# Patient Record
Sex: Male | Born: 1960 | Race: White | Hispanic: No | Marital: Married | Smoking: Never smoker
Health system: Southern US, Community
[De-identification: ages and names within clinical notes are randomized; demographics above are authoritative.]

## PROBLEM LIST (undated history)

## (undated) DIAGNOSIS — C451 Mesothelioma of peritoneum: Principal | ICD-10-CM

## (undated) DIAGNOSIS — C801 Malignant (primary) neoplasm, unspecified: Principal | ICD-10-CM

## (undated) DIAGNOSIS — R188 Other ascites: Secondary | ICD-10-CM

## (undated) DIAGNOSIS — C786 Secondary malignant neoplasm of retroperitoneum and peritoneum: Secondary | ICD-10-CM

## (undated) HISTORY — DX: Mesothelioma of peritoneum: C45.1

---

## 1998-06-11 ENCOUNTER — Ambulatory Visit (HOSPITAL_COMMUNITY): Admission: RE | Admit: 1998-06-11 | Discharge: 1998-06-11 | Payer: Self-pay | Admitting: Family Medicine

## 1999-07-25 ENCOUNTER — Other Ambulatory Visit: Admission: RE | Admit: 1999-07-25 | Discharge: 1999-07-25 | Payer: Self-pay | Admitting: Family Medicine

## 2016-12-06 ENCOUNTER — Emergency Department (HOSPITAL_COMMUNITY): Payer: PRIVATE HEALTH INSURANCE

## 2016-12-06 ENCOUNTER — Emergency Department (HOSPITAL_COMMUNITY)
Admission: EM | Admit: 2016-12-06 | Discharge: 2016-12-06 | Disposition: A | Discharge status: Home / Self Care | Payer: PRIVATE HEALTH INSURANCE | Attending: Emergency Medicine | Admitting: Emergency Medicine

## 2016-12-06 ENCOUNTER — Encounter (HOSPITAL_COMMUNITY): Payer: Self-pay | Admitting: Emergency Medicine

## 2016-12-06 DIAGNOSIS — R140 Abdominal distension (gaseous): Secondary | ICD-10-CM | POA: Diagnosis present

## 2016-12-06 DIAGNOSIS — Z79899 Other long term (current) drug therapy: Secondary | ICD-10-CM | POA: Diagnosis not present

## 2016-12-06 LAB — URINALYSIS, ROUTINE W REFLEX MICROSCOPIC
BILIRUBIN URINE: NEGATIVE
Glucose, UA: NEGATIVE mg/dL
Hgb urine dipstick: NEGATIVE
Ketones, ur: 80 mg/dL — AB
Nitrite: NEGATIVE
PROTEIN: 30 mg/dL — AB
SPECIFIC GRAVITY, URINE: 1.02 (ref 1.005–1.030)
SQUAMOUS EPITHELIAL / LPF: NONE SEEN
pH: 6 (ref 5.0–8.0)

## 2016-12-06 LAB — CBC WITH DIFFERENTIAL/PLATELET
BASOS ABS: 0 10*3/uL (ref 0.0–0.1)
Basophils Relative: 0 %
EOS ABS: 0.1 10*3/uL (ref 0.0–0.7)
EOS PCT: 1 %
HCT: 47.5 % (ref 39.0–52.0)
Hemoglobin: 16.6 g/dL (ref 13.0–17.0)
LYMPHS PCT: 13 %
Lymphs Abs: 1.2 10*3/uL (ref 0.7–4.0)
MCH: 31.4 pg (ref 26.0–34.0)
MCHC: 34.9 g/dL (ref 30.0–36.0)
MCV: 90 fL (ref 78.0–100.0)
Monocytes Absolute: 1.2 10*3/uL — ABNORMAL HIGH (ref 0.1–1.0)
Monocytes Relative: 13 %
Neutro Abs: 6.7 10*3/uL (ref 1.7–7.7)
Neutrophils Relative %: 73 %
PLATELETS: 331 10*3/uL (ref 150–400)
RBC: 5.28 MIL/uL (ref 4.22–5.81)
RDW: 12.8 % (ref 11.5–15.5)
WBC: 9.2 10*3/uL (ref 4.0–10.5)

## 2016-12-06 LAB — COMPREHENSIVE METABOLIC PANEL
ALT: 49 U/L (ref 17–63)
AST: 38 U/L (ref 15–41)
Albumin: 3.2 g/dL — ABNORMAL LOW (ref 3.5–5.0)
Alkaline Phosphatase: 60 U/L (ref 38–126)
Anion gap: 9 (ref 5–15)
BUN: 6 mg/dL (ref 6–20)
CHLORIDE: 100 mmol/L — AB (ref 101–111)
CO2: 26 mmol/L (ref 22–32)
CREATININE: 1 mg/dL (ref 0.61–1.24)
Calcium: 8.9 mg/dL (ref 8.9–10.3)
GFR calc Af Amer: >60 mL/min (ref >60)
GFR calc non Af Amer: >60 mL/min (ref >60)
Glucose, Bld: 85 mg/dL (ref 65–99)
Potassium: 3.9 mmol/L (ref 3.5–5.1)
SODIUM: 135 mmol/L (ref 135–145)
Total Bilirubin: 0.8 mg/dL (ref 0.3–1.2)
Total Protein: 6.2 g/dL — ABNORMAL LOW (ref 6.5–8.1)

## 2016-12-06 LAB — LIPASE, BLOOD: Lipase: 25 U/L (ref 11–51)

## 2016-12-06 NOTE — ED Notes (Signed)
Pt. sts he has been doing a green drink cleanse and has not eaten for the last 9 days. Pt. sts he has had regular bowel movements, but not passing gas. Pt. Denies N/V. Pt. Abdomen noted to be distended. No bruising noted to groin area, but fungal rash noted by EDP.

## 2016-12-06 NOTE — ED Notes (Signed)
EDP at bedside  

## 2016-12-06 NOTE — ED Provider Notes (Signed)
[facility] DEPT Provider Note   CSN: [id number] Arrival date & time: 12/06/16  1010     History   Chief Complaint Chief Complaint  Patient presents with  . Abdominal Pain  . Bleeding/Bruising    HPI Frank Tucker is a 55 y.o. male who presents with abdominal distension and pain. No significant PMH. He states he has been doing a "green juice cleanse" for the past 9 days. He noticed abdominal distension with pain about a week ago. Pain feels like a dull pressure. He has been taking simethicone with good relief. He has had excessive belching but has not been passing gas. Has been having multiple formed BMs daily. Reports associated difficulty getting deep breaths due to distension. Denies fever, chills, chest pain, SOB, cough, nausea, vomiting, diarrhea, urinary symptoms. He also has noted a rash in his groin area and bruising which concerned him therefore he came to the ED today. He has been treating the rash with hydrocortisone. He states he does not drink ETOH or smoke. Denies previous abdominal surgeries. Never had a colonoscopy.  HPI  History reviewed. No pertinent past medical history.  There are no active problems to display for this patient.   History reviewed. No pertinent surgical history.     Home Medications    Prior to Admission medications   Medication Sig Start Date End Date Taking? Authorizing Provider  hydrocortisone cream 1 % Apply 1 application topically as needed for itching.   Yes Historical Provider, MD  SIMETHICONE PO Take 1 capsule by mouth as needed (for stomach).   Yes Historical Provider, MD    Family History History reviewed. No pertinent family history.  Social History Social History  Substance Use Topics  . Smoking status: Never Smoker  . Smokeless tobacco: Never Used  . Alcohol use No     Allergies   Patient has no known allergies.   Review of Systems Review of Systems  Constitutional: Negative for chills and fever.    Respiratory: Negative for shortness of breath.   Cardiovascular: Negative for chest pain.  Gastrointestinal: Positive for abdominal distention and abdominal pain.  Genitourinary: Negative for dysuria.  All other systems reviewed and are negative.    Physical Exam Updated Vital Signs BP 113/82 (BP Location: Right Arm)   Pulse 91   Temp 97.6 F (36.4 C) (Oral)   Resp 18   Ht 5\' 10"  (1.778 m)   Wt 104.3 kg   SpO2 95%   BMI 33.00 kg/m   Physical Exam  Constitutional: He is oriented to person, place, and time. He appears well-developed and well-nourished. No distress.  HENT:  Head: Normocephalic and atraumatic.  Eyes: Conjunctivae are normal. Pupils are equal, round, and reactive to light. Right eye exhibits no discharge. Left eye exhibits no discharge. No scleral icterus.  Neck: Normal range of motion.  Cardiovascular: Normal rate and regular rhythm.  Exam reveals no gallop and no friction rub.   No murmur heard. Pulmonary/Chest: Effort normal and breath sounds normal. No respiratory distress. He has no wheezes. He has no rales. He exhibits no tenderness.  Abdominal: Soft. Bowel sounds are normal. He exhibits no distension and no mass. There is tenderness. There is no rebound and no guarding. No hernia.  Diffuse mild tenderness. Hypoactive bowel sounds  Neurological: He is alert and oriented to person, place, and time.  Skin: Skin is warm and dry.  Psychiatric: He has a normal mood and affect. His behavior is normal.  Nursing note and vitals  reviewed.    ED Treatments / Results  Labs (all labs ordered are listed, but only abnormal results are displayed) Labs Reviewed  CBC WITH DIFFERENTIAL/PLATELET - Abnormal; Notable for the following:       Result Value   Monocytes Absolute 1.2 (*)    All other components within normal limits  COMPREHENSIVE METABOLIC PANEL - Abnormal; Notable for the following:    Chloride 100 (*)    Total Protein 6.2 (*)    Albumin 3.2 (*)    All  other components within normal limits  URINALYSIS, ROUTINE W REFLEX MICROSCOPIC - Abnormal; Notable for the following:    APPearance HAZY (*)    Ketones, ur 80 (*)    Protein, ur 30 (*)    Leukocytes, UA TRACE (*)    Bacteria, UA RARE (*)    All other components within normal limits  LIPASE, BLOOD    EKG  EKG Interpretation None       Radiology Dg Abdomen 1 View  Result Date: 12/06/2016 CLINICAL DATA:  Central abdominal pain and distension. EXAM: ABDOMEN - 1 VIEW COMPARISON:  None. FINDINGS: Normal bowel gas pattern. Lumbar and lower thoracic spine degenerative changes. IMPRESSION: No acute abnormality. Electronically Signed   By: Claudie Revering M.D.   On: 12/06/2016 12:10    Procedures Procedures (including critical care time)  Medications Ordered in ED Medications - No data to display   Initial Impression / Assessment and Plan / ED Course  I have reviewed the triage vital signs and the nursing notes.  Pertinent labs & imaging results that were available during my care of the patient were reviewed by me and considered in my medical decision making (see chart for details).  Clinical Course    55 year old male presents with abdominal distension. Unclear etiology. May be due to excessive fiber intake from "cleanse". Patient is afebrile, not tachycardic or tachypneic, normotensive, and not hypoxic. Labs are overall unremarkable. UA remarkable for 80 ketones, trace leukocytes, 6-30 WBC. No urinary symptoms. KUB negative.   Shared visit with Dr. Jeanell Sparrow. Advised to follow up as outpatient. Do not feel further work up is currently indicated. Patient agreeable. Patient is NAD, non-toxic, with normal VS. Patient is informed of clinical course, understands medical decision making process, and agrees with plan. Opportunity for questions provided and all questions answered. Return precautions given.    Final Clinical Impressions(s) / ED Diagnoses   Final diagnoses:  Abdominal distension     New Prescriptions New Prescriptions   No medications on file     Recardo Evangelist, PA-C 12/06/16 1400    Pattricia Boss, MD 12/07/16 [phone]

## 2016-12-06 NOTE — ED Triage Notes (Signed)
Pt sts abd pain and distention and noticed bruising to legs currently and to abd last week; pt sts abnormal

## 2016-12-10 ENCOUNTER — Encounter: Payer: Self-pay | Admitting: Physician Assistant

## 2016-12-16 ENCOUNTER — Inpatient Hospital Stay (HOSPITAL_COMMUNITY)
Admission: EM | Admit: 2016-12-16 | Discharge: 2016-12-18 | DRG: 375 | Disposition: A | Discharge status: Home / Self Care | Payer: PRIVATE HEALTH INSURANCE | Attending: Internal Medicine | Admitting: Internal Medicine

## 2016-12-16 ENCOUNTER — Encounter (HOSPITAL_COMMUNITY): Payer: Self-pay | Admitting: Emergency Medicine

## 2016-12-16 ENCOUNTER — Observation Stay (HOSPITAL_COMMUNITY): Payer: PRIVATE HEALTH INSURANCE

## 2016-12-16 ENCOUNTER — Emergency Department (HOSPITAL_COMMUNITY): Payer: PRIVATE HEALTH INSURANCE

## 2016-12-16 DIAGNOSIS — R188 Other ascites: Secondary | ICD-10-CM | POA: Diagnosis not present

## 2016-12-16 DIAGNOSIS — C801 Malignant (primary) neoplasm, unspecified: Secondary | ICD-10-CM | POA: Diagnosis present

## 2016-12-16 DIAGNOSIS — C786 Secondary malignant neoplasm of retroperitoneum and peritoneum: Principal | ICD-10-CM | POA: Diagnosis present

## 2016-12-16 DIAGNOSIS — R0602 Shortness of breath: Secondary | ICD-10-CM | POA: Diagnosis not present

## 2016-12-16 DIAGNOSIS — R180 Malignant ascites: Secondary | ICD-10-CM

## 2016-12-16 LAB — COMPREHENSIVE METABOLIC PANEL
ALBUMIN: 3.2 g/dL — AB (ref 3.5–5.0)
ALK PHOS: 70 U/L (ref 38–126)
ALT: 38 U/L (ref 17–63)
ANION GAP: 12 (ref 5–15)
AST: 34 U/L (ref 15–41)
BILIRUBIN TOTAL: 0.6 mg/dL (ref 0.3–1.2)
BUN: 9 mg/dL (ref 6–20)
CALCIUM: 9.1 mg/dL (ref 8.9–10.3)
CO2: 27 mmol/L (ref 22–32)
Chloride: 100 mmol/L — ABNORMAL LOW (ref 101–111)
Creatinine, Ser: 1.07 mg/dL (ref 0.61–1.24)
GFR calc Af Amer: >60 mL/min (ref >60)
GLUCOSE: 80 mg/dL (ref 65–99)
POTASSIUM: 4.3 mmol/L (ref 3.5–5.1)
Sodium: 139 mmol/L (ref 135–145)
TOTAL PROTEIN: 6.4 g/dL — AB (ref 6.5–8.1)

## 2016-12-16 LAB — PROTIME-INR
INR: 1.01
Prothrombin Time: 13.3 seconds (ref 11.4–15.2)

## 2016-12-16 LAB — URINALYSIS, ROUTINE W REFLEX MICROSCOPIC
Bilirubin Urine: NEGATIVE
GLUCOSE, UA: NEGATIVE mg/dL
HGB URINE DIPSTICK: NEGATIVE
Ketones, ur: 80 mg/dL — AB
Leukocytes, UA: NEGATIVE
NITRITE: NEGATIVE
PH: 5 (ref 5.0–8.0)
Protein, ur: 100 mg/dL — AB
Specific Gravity, Urine: 1.026 (ref 1.005–1.030)
Squamous Epithelial / LPF: NONE SEEN

## 2016-12-16 LAB — APTT: aPTT: 33 seconds (ref 24–36)

## 2016-12-16 LAB — CBC
HEMATOCRIT: 47.5 % (ref 39.0–52.0)
HEMOGLOBIN: 16.9 g/dL (ref 13.0–17.0)
MCH: 32.2 pg (ref 26.0–34.0)
MCHC: 35.6 g/dL (ref 30.0–36.0)
MCV: 90.5 fL (ref 78.0–100.0)
Platelets: 385 10*3/uL (ref 150–400)
RBC: 5.25 MIL/uL (ref 4.22–5.81)
RDW: 12.9 % (ref 11.5–15.5)
WBC: 8.6 10*3/uL (ref 4.0–10.5)

## 2016-12-16 LAB — BRAIN NATRIURETIC PEPTIDE: B Natriuretic Peptide: 8.5 pg/mL (ref 0.0–100.0)

## 2016-12-16 LAB — LIPASE, BLOOD: Lipase: 20 U/L (ref 11–51)

## 2016-12-16 MED ORDER — ONDANSETRON HCL 4 MG/2ML IJ SOLN: 4.0000 mg | Freq: Four times a day (QID) | INTRAMUSCULAR | Status: DC | PRN

## 2016-12-16 MED ORDER — ACETAMINOPHEN 325 MG PO TABS
650.0000 mg | ORAL_TABLET | Freq: Four times a day (QID) | ORAL | Status: DC | PRN
  Administered 2016-12-16 – 2016-12-17 (×2): 650 mg via ORAL
  Filled 2016-12-16 (×2): qty 2

## 2016-12-16 MED ORDER — ACETAMINOPHEN 650 MG RE SUPP: 650.0000 mg | Freq: Four times a day (QID) | RECTAL | Status: DC | PRN

## 2016-12-16 MED ORDER — IOPAMIDOL (ISOVUE-300) INJECTION 61%
INTRAVENOUS | Status: AC
  Administered 2016-12-16: 100 mL
  Filled 2016-12-16: qty 100

## 2016-12-16 MED ORDER — DEXTROSE 5 % IV SOLN
2.0000 g | Freq: Every day | INTRAVENOUS | Status: DC
  Administered 2016-12-16 – 2016-12-17 (×2): 2 g via INTRAVENOUS
  Filled 2016-12-16 (×2): qty 2

## 2016-12-16 MED ORDER — MORPHINE SULFATE (PF) 4 MG/ML IV SOLN
4.0000 mg | Freq: Once | INTRAVENOUS | Status: AC
  Administered 2016-12-16: 4 mg via INTRAVENOUS
  Filled 2016-12-16: qty 1

## 2016-12-16 MED ORDER — ONDANSETRON HCL 4 MG PO TABS: 4.0000 mg | ORAL_TABLET | Freq: Four times a day (QID) | ORAL | Status: DC | PRN

## 2016-12-16 NOTE — H&P (Signed)
History and Physical    Frank Tucker [id number] DOB: July 18, 1961 DOA: 12/16/2016  PCP: No PCP Per Patient  Patient coming from: Home.  Chief Complaint: Abdominal distention and discomfort.  HPI: Frank Tucker is a 55 y.o. male with no significant past medical history presents to the ER with the complaints of increasing abdominal distention over the last 2 weeks with abdominal discomfort. Denies any nausea vomiting diarrhea fever or chills. Has had some shortness of breath due to abdominal distention. CT of the abdomen and pelvis shows features concerning for peritoneal carcinomatosis with large ascites. Patient is being admitted for further observation and management.   ED Course: CT of the abdomen and pelvis shows features concerning for peritoneal carcinomatosis with malignant ascites.  Review of Systems: As per HPI, rest all negative.   History reviewed. No pertinent past medical history.  History reviewed. No pertinent surgical history.   reports that he has never smoked. He has never used smokeless tobacco. He reports that he does not drink alcohol or use drugs.  No Known Allergies  Family History  Problem Relation Age of Onset  . Brain cancer Mother   . CAD Father     Prior to Admission medications   Medication Sig Start Date End Date Taking? Authorizing Provider  ibuprofen (ADVIL,MOTRIN) 200 MG tablet Take 200 mg by mouth every 6 (six) hours as needed.   Yes Historical Provider, MD    Physical Exam: Vitals:   12/16/16 2030 12/16/16 2045 12/16/16 2115 12/16/16 2224  BP: 130/82 131/94 137/97 120/68  Pulse: 91 92 99 91  Resp: 22 20 17 17   Temp:    97.9 F (36.6 C)  TempSrc:    Oral  SpO2: 95% 93% 94% 94%  Weight:    111.7 kg (246 lb 4.1 oz)  Height:    5\' 10"  (1.778 m)      Constitutional: Moderately built and nourished. Vitals:   12/16/16 2030 12/16/16 2045 12/16/16 2115 12/16/16 2224  BP: 130/82 131/94 137/97 120/68  Pulse: 91 92 99 91  Resp:  22 20 17 17   Temp:    97.9 F (36.6 C)  TempSrc:    Oral  SpO2: 95% 93% 94% 94%  Weight:    111.7 kg (246 lb 4.1 oz)  Height:    5\' 10"  (1.778 m)   Eyes: Anicteric no pallor. ENMT: No discharge from the ears eyes nose and mouth. Neck: No mass felt. No neck rigidity. Respiratory: No rhonchi or crepitations. Cardiovascular: S1-S2 heard no murmurs appreciated. Abdomen: Distended abdomen nontender bowel sounds present. No guarding or rigidity. Musculoskeletal: No edema. Skin: No rash. Skin appears warm. Neurologic: Alert awake oriented to time place and person. Moves all extremities. Psychiatric: Appears normal. Normal affect.   Labs on Admission: I have personally reviewed following labs and imaging studies  CBC:  Recent Labs Lab 12/16/16 1209  WBC 8.6  HGB 16.9  HCT 47.5  MCV 90.5  PLT 0000000   Basic Metabolic Panel:  Recent Labs Lab 12/16/16 1209  NA 139  K 4.3  CL 100*  CO2 27  GLUCOSE 80  BUN 9  CREATININE 1.07  CALCIUM 9.1   GFR: Estimated Creatinine Clearance: 98.8 mL/min (by C-G formula based on SCr of 1.07 mg/dL). Liver Function Tests:  Recent Labs Lab 12/16/16 1209  AST 34  ALT 38  ALKPHOS 70  BILITOT 0.6  PROT 6.4*  ALBUMIN 3.2*    Recent Labs Lab 12/16/16 1209  LIPASE 20  No results for input(s): AMMONIA in the last 168 hours. Coagulation Profile:  Recent Labs Lab 12/16/16 1919  INR 1.01   Cardiac Enzymes: No results for input(s): CKTOTAL, CKMB, CKMBINDEX, TROPONINI in the last 168 hours. BNP (last 3 results) No results for input(s): PROBNP in the last 8760 hours. HbA1C: No results for input(s): HGBA1C in the last 72 hours. CBG: No results for input(s): GLUCAP in the last 168 hours. Lipid Profile: No results for input(s): CHOL, HDL, LDLCALC, TRIG, CHOLHDL, LDLDIRECT in the last 72 hours. Thyroid Function Tests: No results for input(s): TSH, T4TOTAL, FREET4, T3FREE, THYROIDAB in the last 72 hours. Anemia Panel: No results  for input(s): VITAMINB12, FOLATE, FERRITIN, TIBC, IRON, RETICCTPCT in the last 72 hours. Urine analysis:    Component Value Date/Time   COLORURINE YELLOW 12/16/2016 1209   APPEARANCEUR HAZY (A) 12/16/2016 1209   LABSPEC 1.026 12/16/2016 1209   PHURINE 5.0 12/16/2016 1209   GLUCOSEU NEGATIVE 12/16/2016 1209   HGBUR NEGATIVE 12/16/2016 1209   BILIRUBINUR NEGATIVE 12/16/2016 1209   KETONESUR 80 (A) 12/16/2016 1209   PROTEINUR 100 (A) 12/16/2016 1209   NITRITE NEGATIVE 12/16/2016 1209   LEUKOCYTESUR NEGATIVE 12/16/2016 1209   Sepsis Labs: @LABRCNTIP (procalcitonin:4,lacticidven:4) )No results found for this or any previous visit (from the past 240 hour(s)).   Radiological Exams on Admission: Ct Abdomen Pelvis W Contrast  Result Date: 12/16/2016 CLINICAL DATA:  Subacute onset of worsening generalized abdominal distention and pain. Initial encounter. EXAM: CT ABDOMEN AND PELVIS WITH CONTRAST TECHNIQUE: Multidetector CT imaging of the abdomen and pelvis was performed using the standard protocol following bolus administration of intravenous contrast. CONTRAST:  148mL ISOVUE-300 IOPAMIDOL (ISOVUE-300) INJECTION 61% COMPARISON:  Abdominal radiograph performed 12/06/2016 FINDINGS: Lower chest: Mild bibasilar atelectasis is noted. The visualized portions of the mediastinum are otherwise unremarkable. Hepatobiliary: The liver is grossly unremarkable in appearance. The gallbladder is unremarkable. The common bile duct remains normal in caliber. Pancreas: The pancreas is unremarkable in appearance. Spleen: The spleen is unremarkable in appearance. Adrenals/Urinary Tract: The adrenal glands are unremarkable in appearance. The kidneys are within normal limits. There is no evidence of hydronephrosis. No renal or ureteral stones are identified. Mild nonspecific perinephric stranding is noted bilaterally. Stomach/Bowel: The stomach is unremarkable in appearance. The small bowel is grossly unremarkable, though  difficult to fully assess due to surrounding fluid. The appendix is normal in caliber, without evidence of appendicitis. No definite colonic mass is seen. The colon is difficult to fully assess due to surrounding fluid. Vascular/Lymphatic: The abdominal aorta is unremarkable in appearance. The inferior vena cava is grossly unremarkable. No retroperitoneal lymphadenopathy is seen. No pelvic sidewall lymphadenopathy is identified. Reproductive: A small urachal remnant is noted. The bladder is mildly distended and otherwise unremarkable. The prostate is borderline prominent. Other: A large amount of ascites is noted within the abdomen and pelvis. There is diffuse omental caking, more dense at the right upper quadrant, compatible with peritoneal carcinomatosis. Associated recruitment of vasculature is noted. No definite primary malignancy is identified on this study, though it cannot be excluded. Mesothelioma could have such an appearance. Musculoskeletal: No acute osseous abnormalities are identified. The visualized musculature is unremarkable in appearance. IMPRESSION: 1. Diffuse omental caking, more dense at the right upper quadrant, compatible with diffuse peritoneal carcinomatosis. No definite primary malignancy identified on the study, though it cannot be excluded. Mesothelioma could also have such an appearance. 2. Large amount of ascites throughout the abdomen and pelvis. Diagnostic paracentesis is recommended for further evaluation, to assess  for the origin of the malignancy. 3. Mild bibasilar atelectasis noted. These results were called by telephone at the time of interpretation on 12/16/2016 at 6:16 pm to Dr. Theotis Burrow, who verbally acknowledged these results. Electronically Signed   By: Garald Balding M.D.   On: 12/16/2016 18:16     Assessment/Plan Principal Problem:   Malignant ascites Active Problems:   Peritoneal carcinomatosis (HCC)   Ascites    1. Large ascites with possible peritoneal  carcinomatosis - I have ordered ultrasound-guided paracentesis. Requested labs for cell count differential Gram stain albumin LDH culture cytology. Patient may need oncology input. Also added empiric antibiotics until labs available.   DVT prophylaxis: SCDs. Code Status: Full code.  Family Communication: Patient's brother and sister at the bedside.  Disposition Plan: Home.  Consults called: None.  Admission status: Observation.    Rise Patience MD [facility] Pager [phone].  If 7PM-7AM, please contact night-coverage [email or web address] Password [facility]  12/16/2016, 10:42 PM

## 2016-12-16 NOTE — ED Triage Notes (Signed)
Pt. Stated, I've had stomach pain for over 3 weeks, and I feels so bloated and des tended. I tried to see a doctor but its not til the 5th.  Im just feeling bad and can not eat.

## 2016-12-16 NOTE — ED Notes (Signed)
Lab to add on BNP.  °

## 2016-12-16 NOTE — ED Notes (Signed)
Report attempted 

## 2016-12-16 NOTE — ED Notes (Signed)
Dr. Rex Kras advised pt can eat/drink, NPO after midnight. Pt updated

## 2016-12-16 NOTE — Progress Notes (Signed)
Pharmacy Antibiotic Note  SAMUELLE FULFORD is a 55 y.o. male admitted on 12/16/2016 with abd pain.  Pharmacy has been consulted for Rocephin dosing for intra-abd infection.  Plan: Rocephin 2gm IV q24h Pharmacy will sign off - please reconsult if needed  Height: 5\' 10"  (177.8 cm) Weight: 246 lb 4.1 oz (111.7 kg) IBW/kg (Calculated) : 73  Temp (24hrs), Avg:97.9 F (36.6 C), Min:97.8 F (36.6 C), Max:97.9 F (36.6 C)   Recent Labs Lab 12/16/16 1209  WBC 8.6  CREATININE 1.07    Estimated Creatinine Clearance: 98.8 mL/min (by C-G formula based on SCr of 1.07 mg/dL).    No Known Allergies  Thank you for allowing pharmacy to be a part of this patient's care.  Sherlon Handing, PharmD, BCPS Clinical pharmacist, pager [phone] 12/16/2016 11:07 PM

## 2016-12-16 NOTE — ED Notes (Signed)
Patient transported to CT 

## 2016-12-16 NOTE — ED Provider Notes (Signed)
[facility] DEPT Provider Note   CSN: [id number] Arrival date & time: 12/16/16 1153     History    Chief Complaint  Patient presents with  . Abdominal Pain     HPI Frank Tucker is a 55 y.o. male.  55yo M who p/w abdominal pain and distension. Patient reports a 2-3 week history of progressively worsening generalized abdominal pain and distention. He has noticed some weight gain recently and reports that he has some shortness of breath because he feels like he can't take a deep breath due to the severity of his distention. He denies any fevers, vomiting, chest pain, blood in his stool, or urinary problems. His symptoms are worse with eating. He was evaluated here last week and tried to schedule a follow-up appointment with his PCP but could not be seen quickly and his symptoms have progressed to the point that he cannot wait. No alcohol use or hx of liver problems.  History reviewed. No pertinent past medical history.   Patient Active Problem List   Diagnosis Date Noted  . Peritoneal carcinomatosis ([place]) 12/16/2016    History reviewed. No pertinent surgical history.      Home Medications    Prior to Admission medications   Medication Sig Start Date End Date Taking? Authorizing Provider  ibuprofen (ADVIL,MOTRIN) 200 MG tablet Take 200 mg by mouth every 6 (six) hours as needed.   Yes Historical Provider, MD      Family History  Problem Relation Age of Onset  . Brain cancer Mother   . CAD Father      Social History  Substance Use Topics  . Smoking status: Never Smoker  . Smokeless tobacco: Never Used  . Alcohol use No     Allergies     Patient has no known allergies.    Review of Systems  10 Systems reviewed and are negative for acute change except as noted in the HPI.   Physical Exam Updated Vital Signs BP 137/97   Pulse 99   Temp 97.8 F (36.6 C) (Oral)   Resp 17   Ht 5\' 10"  (1.778 m)   Wt 246 lb 5 oz (111.7 kg)   SpO2 94%   BMI  35.34 kg/m   Physical Exam  Constitutional: He is oriented to person, place, and time. He appears well-developed and well-nourished. No distress.  HENT:  Head: Normocephalic and atraumatic.  Moist mucous membranes  Eyes: Conjunctivae are normal. Pupils are equal, round, and reactive to light.  Neck: Neck supple.  Cardiovascular: Normal rate, regular rhythm and normal heart sounds.   No murmur heard. Pulmonary/Chest: Effort normal and breath sounds normal.  Abdominal: Bowel sounds are normal. He exhibits distension. There is no tenderness.  Moderate to severe distension without focal tenderness  Musculoskeletal: He exhibits no edema.  Neurological: He is alert and oriented to person, place, and time.  Fluent speech  Skin: Skin is warm and dry.  Psychiatric: He has a normal mood and affect. Judgment normal.  Nursing note and vitals reviewed.     ED Treatments / Results  Labs (all labs ordered are listed, but only abnormal results are displayed) Labs Reviewed  COMPREHENSIVE METABOLIC PANEL - Abnormal; Notable for the following:       Result Value   Chloride 100 (*)    Total Protein 6.4 (*)    Albumin 3.2 (*)    All other components within normal limits  URINALYSIS, ROUTINE W REFLEX MICROSCOPIC - Abnormal; Notable for the following:  APPearance HAZY (*)    Ketones, ur 80 (*)    Protein, ur 100 (*)    Bacteria, UA RARE (*)    All other components within normal limits  LIPASE, BLOOD  CBC  BRAIN NATRIURETIC PEPTIDE  PROTIME-INR  APTT     EKG  EKG Interpretation  Date/Time:  Wednesday December 16 2016 18:08:41 EST Ventricular Rate:  89 PR Interval:    QRS Duration: 113 QT Interval:  359 QTC Calculation: 437 R Axis:   95 Text Interpretation:  Sinus rhythm Lateral infarct, age indeterminate Probable anteroseptal infarct, recent No previous ECGs available Confirmed by Joseff Luckman MD, Dakwan Pridgen [phone]) on 12/16/2016 6:14:53 PM         Radiology Ct Abdomen Pelvis W  Contrast  Result Date: 12/16/2016 CLINICAL DATA:  Subacute onset of worsening generalized abdominal distention and pain. Initial encounter. EXAM: CT ABDOMEN AND PELVIS WITH CONTRAST TECHNIQUE: Multidetector CT imaging of the abdomen and pelvis was performed using the standard protocol following bolus administration of intravenous contrast. CONTRAST:  165mL ISOVUE-300 IOPAMIDOL (ISOVUE-300) INJECTION 61% COMPARISON:  Abdominal radiograph performed 12/06/2016 FINDINGS: Lower chest: Mild bibasilar atelectasis is noted. The visualized portions of the mediastinum are otherwise unremarkable. Hepatobiliary: The liver is grossly unremarkable in appearance. The gallbladder is unremarkable. The common bile duct remains normal in caliber. Pancreas: The pancreas is unremarkable in appearance. Spleen: The spleen is unremarkable in appearance. Adrenals/Urinary Tract: The adrenal glands are unremarkable in appearance. The kidneys are within normal limits. There is no evidence of hydronephrosis. No renal or ureteral stones are identified. Mild nonspecific perinephric stranding is noted bilaterally. Stomach/Bowel: The stomach is unremarkable in appearance. The small bowel is grossly unremarkable, though difficult to fully assess due to surrounding fluid. The appendix is normal in caliber, without evidence of appendicitis. No definite colonic mass is seen. The colon is difficult to fully assess due to surrounding fluid. Vascular/Lymphatic: The abdominal aorta is unremarkable in appearance. The inferior vena cava is grossly unremarkable. No retroperitoneal lymphadenopathy is seen. No pelvic sidewall lymphadenopathy is identified. Reproductive: A small urachal remnant is noted. The bladder is mildly distended and otherwise unremarkable. The prostate is borderline prominent. Other: A large amount of ascites is noted within the abdomen and pelvis. There is diffuse omental caking, more dense at the right upper quadrant, compatible with  peritoneal carcinomatosis. Associated recruitment of vasculature is noted. No definite primary malignancy is identified on this study, though it cannot be excluded. Mesothelioma could have such an appearance. Musculoskeletal: No acute osseous abnormalities are identified. The visualized musculature is unremarkable in appearance. IMPRESSION: 1. Diffuse omental caking, more dense at the right upper quadrant, compatible with diffuse peritoneal carcinomatosis. No definite primary malignancy identified on the study, though it cannot be excluded. Mesothelioma could also have such an appearance. 2. Large amount of ascites throughout the abdomen and pelvis. Diagnostic paracentesis is recommended for further evaluation, to assess for the origin of the malignancy. 3. Mild bibasilar atelectasis noted. These results were called by telephone at the time of interpretation on 12/16/2016 at 6:16 pm to Dr. Theotis Burrow, who verbally acknowledged these results. Electronically Signed   By: Garald Balding M.D.   On: 12/16/2016 18:16    Procedures Procedures (including critical care time) Procedures  Medications Ordered in ED  Medications  iopamidol (ISOVUE-300) 61 % injection (100 mLs  Contrast Given 12/16/16 1729)  morphine 4 MG/ML injection 4 mg (4 mg Intravenous Given 12/16/16 1917)     Initial Impression / Assessment and Plan /  ED Course  I have reviewed the triage vital signs and the nursing notes.  Pertinent labs & imaging results that were available during my care of the patient were reviewed by me and considered in my medical decision making (see chart for details).  Clinical Course    PT w/ several weeks of progressively worsening abdominal distention. He had moderate to severe abdominal distention on exam without focal tenderness. He is having bowel movements making obstruction less likely but given the degree of his distention and progressively worsening symptoms, obtained CT of abdomen and pelvis. His  lab work is unremarkable.  Gave the patient morphine for pain. CT shows diffuse omental caking suggestive of diffuse peritoneal carcinomatosis. Patient denies any history of known asbestos exposure. I discussed these results with him and recommended admission for paracentesis and further workup. Discussed admission with [facility] hospitalist, Dr. Hal Hope and pt admitted for further work up.   Final Clinical Impressions(s) / ED Diagnoses   Final diagnoses:  Peritoneal carcinomatosis ([place])  Malignant ascites     New Prescriptions   No medications on file       Sharlett Iles, MD 12/17/16 [phone]

## 2016-12-17 ENCOUNTER — Telehealth: Payer: Self-pay | Admitting: *Deleted

## 2016-12-17 ENCOUNTER — Observation Stay (HOSPITAL_COMMUNITY): Payer: PRIVATE HEALTH INSURANCE

## 2016-12-17 DIAGNOSIS — R180 Malignant ascites: Secondary | ICD-10-CM | POA: Diagnosis not present

## 2016-12-17 DIAGNOSIS — C786 Secondary malignant neoplasm of retroperitoneum and peritoneum: Secondary | ICD-10-CM | POA: Diagnosis present

## 2016-12-17 DIAGNOSIS — R0602 Shortness of breath: Secondary | ICD-10-CM | POA: Diagnosis present

## 2016-12-17 DIAGNOSIS — C801 Malignant (primary) neoplasm, unspecified: Secondary | ICD-10-CM | POA: Diagnosis present

## 2016-12-17 LAB — BASIC METABOLIC PANEL
Anion gap: 11 (ref 5–15)
BUN: 8 mg/dL (ref 6–20)
CALCIUM: 8.6 mg/dL — AB (ref 8.9–10.3)
CO2: 25 mmol/L (ref 22–32)
CREATININE: 1 mg/dL (ref 0.61–1.24)
Chloride: 99 mmol/L — ABNORMAL LOW (ref 101–111)
GFR calc Af Amer: >60 mL/min (ref >60)
Glucose, Bld: 81 mg/dL (ref 65–99)
Potassium: 3.6 mmol/L (ref 3.5–5.1)
SODIUM: 135 mmol/L (ref 135–145)

## 2016-12-17 LAB — CBC
HCT: 44.3 % (ref 39.0–52.0)
Hemoglobin: 15.6 g/dL (ref 13.0–17.0)
MCH: 31.8 pg (ref 26.0–34.0)
MCHC: 35.2 g/dL (ref 30.0–36.0)
MCV: 90.2 fL (ref 78.0–100.0)
PLATELETS: 349 10*3/uL (ref 150–400)
RBC: 4.91 MIL/uL (ref 4.22–5.81)
RDW: 13 % (ref 11.5–15.5)
WBC: 8.3 10*3/uL (ref 4.0–10.5)

## 2016-12-17 LAB — BODY FLUID CELL COUNT WITH DIFFERENTIAL
Eos, Fluid: NONE SEEN %
LYMPHS FL: 34 %
Monocyte-Macrophage-Serous Fluid: 65 % (ref 50–90)
NEUTROPHIL FLUID: 1 % (ref 0–25)
WBC FLUID: 1250 uL — AB (ref 0–1000)

## 2016-12-17 LAB — HEPATIC FUNCTION PANEL
ALBUMIN: 2.8 g/dL — AB (ref 3.5–5.0)
ALT: 39 U/L (ref 17–63)
AST: 35 U/L (ref 15–41)
Alkaline Phosphatase: 61 U/L (ref 38–126)
BILIRUBIN DIRECT: 0.1 mg/dL (ref 0.1–0.5)
Indirect Bilirubin: 0.3 mg/dL (ref 0.3–0.9)
TOTAL PROTEIN: 5.7 g/dL — AB (ref 6.5–8.1)
Total Bilirubin: 0.4 mg/dL (ref 0.3–1.2)

## 2016-12-17 LAB — GRAM STAIN

## 2016-12-17 LAB — LACTATE DEHYDROGENASE, PLEURAL OR PERITONEAL FLUID: LD FL: 1969 U/L — AB (ref 3–23)

## 2016-12-17 LAB — PROTEIN, BODY FLUID: Total protein, fluid: <3 g/dL

## 2016-12-17 LAB — ALBUMIN, FLUID (OTHER): ALBUMIN FL: 1.3 g/dL

## 2016-12-17 MED ORDER — OXYCODONE-ACETAMINOPHEN 5-325 MG PO TABS: 1.0000 | ORAL_TABLET | Freq: Three times a day (TID) | ORAL | Status: DC | PRN

## 2016-12-17 MED ORDER — NYSTATIN 100000 UNIT/GM EX POWD
Freq: Two times a day (BID) | CUTANEOUS | Status: DC
  Administered 2016-12-17 – 2016-12-18 (×3): via TOPICAL
  Filled 2016-12-17: qty 15

## 2016-12-17 MED ORDER — LIDOCAINE HCL (PF) 1 % IJ SOLN
INTRAMUSCULAR | Status: AC
  Filled 2016-12-17: qty 10

## 2016-12-17 NOTE — Procedures (Signed)
   US guided LLQ paracentesis  6.1 L yellow fluid  Tolerated well  Sent for labs per MD

## 2016-12-17 NOTE — Telephone Encounter (Signed)
Oncology Nurse Navigator Documentation  Oncology Nurse Navigator Flowsheets 12/17/2016  Navigator Location [facility]  Referral date to RadOnc/MedOnc 12/17/2016  Navigator Encounter Type Introductory phone call  Abnormal Finding Date 12/16/2016  Spoke with patient and provided new patient appointment for 12/25/16 at 1015/1030 in [facility] with Dr. Benay Spice. Informed of location of [facility], valet service, and registration process. Reminded to bring insurance cards and a current medication list, including supplements. Patient verbalizes understanding. Mailed appointment information to his home address after confirmation. Cancelled his appointment at [facility] for 12/25/16 after OK by Nicoletta Ba, PA. Dr. Benay Spice can refer him back if needed after he is seen on 12/25/16. Notified HIM of appointment.

## 2016-12-17 NOTE — Progress Notes (Signed)
PROGRESS NOTE  Frank Tucker [id number] DOB: 1961/08/03 DOA: 12/16/2016 PCP: No PCP Per Patient  HPI/Recap of past 24 hours:  Report abdominal distension, abdominal pain, loss of appetite and weight gain No fever  Assessment/Plan: Principal Problem:   Malignant ascites Active Problems:   Peritoneal carcinomatosis ([place])   Ascites   Large ascites with possible peritoneal carcinomatosis Patient is a nonsmoker, he dose not drink alcohol, no h/o drug use.  Diagnostic and therapeutic paracentesis to be done on 12/28, cytology ordered CT ab no primary lesion identified, he does not have anemia, add on CT chest, tumor marker testing cea/afp/psa. Will keep rocephin for now, likely able to d/c once fluids study done and rule out sbp. Pain control Oncology Dr Benay Spice consulted.   Code Status: full  Family Communication: patient and wife at bedside  Disposition Plan: home in 1-2 days   Consultants:  Oncology Dr Learta Codding  Procedures:  Paracentesis on 12/28  Antibiotics:  rocephin   Objective: BP 120/68 (BP Location: Left Arm)   Pulse 91   Temp 97.9 F (36.6 C) (Oral)   Resp 17   Ht 5\' 10"  (1.778 m)   Wt 111.7 kg (246 lb 4.1 oz)   SpO2 94%   BMI 35.33 kg/m   Intake/Output Summary (Last 24 hours) at 12/17/16 1125 Last data filed at 12/17/16 1122  Gross per 24 hour  Intake              660 ml  Output              275 ml  Net              385 ml   Filed Weights   12/16/16 1203 12/16/16 2224  Weight: 111.7 kg (246 lb 5 oz) 111.7 kg (246 lb 4.1 oz)    Exam:   General:  NAD  Cardiovascular: RRR  Respiratory: CTABL  Abdomen: distended abdomen, tight, diffuse mild tender, no guarding, no rebound, positive BS  Musculoskeletal: No Edema  Neuro: aaox3  Data Reviewed: Basic Metabolic Panel:  Recent Labs Lab 12/16/16 1209 12/17/16 0459  NA 139 135  K 4.3 3.6  CL 100* 99*  CO2 27 25  GLUCOSE 80 81  BUN 9 8  CREATININE 1.07 1.00  CALCIUM  9.1 8.6*   Liver Function Tests:  Recent Labs Lab 12/16/16 1209 12/17/16 0459  AST 34 35  ALT 38 39  ALKPHOS 70 61  BILITOT 0.6 0.4  PROT 6.4* 5.7*  ALBUMIN 3.2* 2.8*    Recent Labs Lab 12/16/16 1209  LIPASE 20   No results for input(s): AMMONIA in the last 168 hours. CBC:  Recent Labs Lab 12/16/16 1209 12/17/16 0459  WBC 8.6 8.3  HGB 16.9 15.6  HCT 47.5 44.3  MCV 90.5 90.2  PLT 385 349   Cardiac Enzymes:   No results for input(s): CKTOTAL, CKMB, CKMBINDEX, TROPONINI in the last 168 hours. BNP (last 3 results)  Recent Labs  12/16/16 1209  BNP 8.5    ProBNP (last 3 results) No results for input(s): PROBNP in the last 8760 hours.  CBG: No results for input(s): GLUCAP in the last 168 hours.  No results found for this or any previous visit (from the past 240 hour(s)).   Studies: Ct Abdomen Pelvis W Contrast  Result Date: 12/16/2016 CLINICAL DATA:  Subacute onset of worsening generalized abdominal distention and pain. Initial encounter. EXAM: CT ABDOMEN AND PELVIS WITH CONTRAST TECHNIQUE: Multidetector CT imaging of the  abdomen and pelvis was performed using the standard protocol following bolus administration of intravenous contrast. CONTRAST:  186mL ISOVUE-300 IOPAMIDOL (ISOVUE-300) INJECTION 61% COMPARISON:  Abdominal radiograph performed 12/06/2016 FINDINGS: Lower chest: Mild bibasilar atelectasis is noted. The visualized portions of the mediastinum are otherwise unremarkable. Hepatobiliary: The liver is grossly unremarkable in appearance. The gallbladder is unremarkable. The common bile duct remains normal in caliber. Pancreas: The pancreas is unremarkable in appearance. Spleen: The spleen is unremarkable in appearance. Adrenals/Urinary Tract: The adrenal glands are unremarkable in appearance. The kidneys are within normal limits. There is no evidence of hydronephrosis. No renal or ureteral stones are identified. Mild nonspecific perinephric stranding is noted  bilaterally. Stomach/Bowel: The stomach is unremarkable in appearance. The small bowel is grossly unremarkable, though difficult to fully assess due to surrounding fluid. The appendix is normal in caliber, without evidence of appendicitis. No definite colonic mass is seen. The colon is difficult to fully assess due to surrounding fluid. Vascular/Lymphatic: The abdominal aorta is unremarkable in appearance. The inferior vena cava is grossly unremarkable. No retroperitoneal lymphadenopathy is seen. No pelvic sidewall lymphadenopathy is identified. Reproductive: A small urachal remnant is noted. The bladder is mildly distended and otherwise unremarkable. The prostate is borderline prominent. Other: A large amount of ascites is noted within the abdomen and pelvis. There is diffuse omental caking, more dense at the right upper quadrant, compatible with peritoneal carcinomatosis. Associated recruitment of vasculature is noted. No definite primary malignancy is identified on this study, though it cannot be excluded. Mesothelioma could have such an appearance. Musculoskeletal: No acute osseous abnormalities are identified. The visualized musculature is unremarkable in appearance. IMPRESSION: 1. Diffuse omental caking, more dense at the right upper quadrant, compatible with diffuse peritoneal carcinomatosis. No definite primary malignancy identified on the study, though it cannot be excluded. Mesothelioma could also have such an appearance. 2. Large amount of ascites throughout the abdomen and pelvis. Diagnostic paracentesis is recommended for further evaluation, to assess for the origin of the malignancy. 3. Mild bibasilar atelectasis noted. These results were called by telephone at the time of interpretation on 12/16/2016 at 6:16 pm to Dr. Theotis Burrow, who verbally acknowledged these results. Electronically Signed   By: Garald Balding M.D.   On: 12/16/2016 18:16   Dg Chest Port 1 View  Result Date:  12/16/2016 CLINICAL DATA:  Dyspnea on exertion x2 weeks. EXAM: PORTABLE CHEST 1 VIEW COMPARISON:  None. FINDINGS: Low lung volumes with bibasilar atelectasis. Heart is normal in size. No aortic aneurysm. No suspicious osseous lesions. IMPRESSION: Low lung volumes with bibasilar atelectasis. Electronically Signed   By: Ashley Royalty M.D.   On: 12/16/2016 23:18    Scheduled Meds: . cefTRIAXone (ROCEPHIN)  IV  2 g Intravenous QHS  . nystatin   Topical BID    Continuous Infusions:   Time spent: 5mins  Vinaya Sancho MD, PhD  [facility] Pager [phone]. If 7PM-7AM, please contact night-coverage at [email or web address], password [facility] 12/17/2016, 11:25 AM  LOS: 0 days

## 2016-12-18 LAB — BASIC METABOLIC PANEL
Anion gap: 8 (ref 5–15)
BUN: 7 mg/dL (ref 6–20)
CHLORIDE: 100 mmol/L — AB (ref 101–111)
CO2: 29 mmol/L (ref 22–32)
Calcium: 8.3 mg/dL — ABNORMAL LOW (ref 8.9–10.3)
Creatinine, Ser: 1.01 mg/dL (ref 0.61–1.24)
GFR calc Af Amer: >60 mL/min (ref >60)
GLUCOSE: 95 mg/dL (ref 65–99)
POTASSIUM: 3.9 mmol/L (ref 3.5–5.1)
Sodium: 137 mmol/L (ref 135–145)

## 2016-12-18 LAB — PSA: PSA: 0.85 ng/mL (ref 0.00–4.00)

## 2016-12-18 LAB — MISC LABCORP TEST (SEND OUT): Labcorp test code: 88062

## 2016-12-18 LAB — OCCULT BLOOD X 1 CARD TO LAB, STOOL: Fecal Occult Bld: NEGATIVE

## 2016-12-18 LAB — MAGNESIUM: Magnesium: 1.8 mg/dL (ref 1.7–2.4)

## 2016-12-18 LAB — TSH: TSH: 5.244 u[IU]/mL — ABNORMAL HIGH (ref 0.350–4.500)

## 2016-12-18 MED ORDER — SENNOSIDES-DOCUSATE SODIUM 8.6-50 MG PO TABS
2.0000 | ORAL_TABLET | Freq: Two times a day (BID) | ORAL | Status: DC
  Administered 2016-12-18: 2 via ORAL
  Filled 2016-12-18: qty 2

## 2016-12-18 MED ORDER — NYSTATIN 100000 UNIT/GM EX POWD: Freq: Two times a day (BID) | CUTANEOUS | 0 refills | Status: DC

## 2016-12-18 MED ORDER — CEPHALEXIN 500 MG PO CAPS: 500.0000 mg | ORAL_CAPSULE | Freq: Two times a day (BID) | ORAL | 0 refills | Status: DC

## 2016-12-18 MED ORDER — BISACODYL 10 MG RE SUPP: 10.0000 mg | Freq: Every day | RECTAL | Status: DC

## 2016-12-18 MED ORDER — SENNOSIDES-DOCUSATE SODIUM 8.6-50 MG PO TABS: 2.0000 | ORAL_TABLET | Freq: Every day | ORAL | 0 refills | Status: DC

## 2016-12-18 MED ORDER — POLYETHYLENE GLYCOL 3350 17 G PO PACK: 17.0000 g | PACK | Freq: Every day | ORAL | Status: DC

## 2016-12-18 MED ORDER — OXYCODONE-ACETAMINOPHEN 5-325 MG PO TABS: 1.0000 | ORAL_TABLET | Freq: Three times a day (TID) | ORAL | 0 refills | Status: DC | PRN

## 2016-12-18 NOTE — Discharge Summary (Signed)
Discharge Summary  MRK ROHRBAUGH [id number] DOB: Mar 23, 1961  PCP: No PCP Per Patient  Admit date: 12/16/2016 Discharge date: 12/18/2016  Time spent: <37mins  Recommendations for Outpatient Follow-up:  1. F/u with PMD within a week  for hospital discharge follow up, repeat cbc/bmp at follow up 2. F/u with oncology Dr Benay Spice on 1/5  Discharge Diagnoses:  Active Hospital Problems   Diagnosis Date Noted  . Malignant ascites 12/16/2016  . Peritoneal carcinomatosis ([place]) 12/16/2016  . Ascites 12/16/2016    Resolved Hospital Problems   Diagnosis Date Noted Date Resolved  No resolved problems to display.    Discharge Condition: stable  Diet recommendation: regular diet  Filed Weights   12/16/16 2224 12/17/16 2159 12/18/16 0500  Weight: 111.7 kg (246 lb 4.1 oz) 111.5 kg (245 lb 13 oz) 111.5 kg (245 lb 13 oz)    History of present illness:  Patient coming from: Home.  Chief Complaint: Abdominal distention and discomfort.  HPI: Frank Tucker is a 55 y.o. male with no significant past medical history presents to the ER with the complaints of increasing abdominal distention over the last 2 weeks with abdominal discomfort. Denies any nausea vomiting diarrhea fever or chills. Has had some shortness of breath due to abdominal distention. CT of the abdomen and pelvis shows features concerning for peritoneal carcinomatosis with large ascites. Patient is being admitted for further observation and management.   ED Course: CT of the abdomen and pelvis shows features concerning for peritoneal carcinomatosis with malignant ascites.   Hospital Course:  Principal Problem:   Malignant ascites Active Problems:   Peritoneal carcinomatosis ([place])   Ascites   Large ascites with possible peritoneal carcinomatosis Patient is a nonsmoker, he dose not drink alcohol, no h/o drug use.  CT ab/chest no primary lesion identified  he does not have anemia,  tumor marker testing cea/afp  pending, psa wnl.  Diagnostic and therapeutic paracentesis to be done on 12/28, cytology pending, peritoneal fluids wbc 1250, gram statin no orgaminasm, he received iv rocephin in the hospital, patient does not have fever, no leukocytosis, he did have abdominal pain likely large volume, less likely sbp, elevated wbc in peritoneal fluids likely reactive, he received iv rocephin empirically in the hospital , he is discharged on oral keflex to finish total of 5 days of abx.  Pain control Oncology Dr Benay Spice consulted.  candiasis: bilateral groin, topical nystatin  Code Status: full  Family Communication: patient and son at bedside  Disposition Plan: home on 12/29   Consultants:  Oncology Dr Learta Codding  Procedures:  Paracentesis on 12/28 with 6.1 L yellow fluid removed  Antibiotics:  rocephin  Discharge Exam: BP 122/70 (BP Location: Right Arm)   Pulse 78   Temp 98 F (36.7 C) (Oral)   Resp 18   Ht 5\' 10"  (1.778 m)   Wt 111.5 kg (245 lb 13 oz)   SpO2 99%   BMI 35.27 kg/m     General:  NAD  Cardiovascular: RRR  Respiratory: CTABL  Abdomen: less distended abdomen,non tender, no guarding, no rebound, positive BS  Musculoskeletal: No Edema  Neuro: aaox3   Discharge Instructions You were cared for by a hospitalist during your hospital stay. If you have any questions about your discharge medications or the care you received while you were in the hospital after you are discharged, you can call the unit and asked to speak with the hospitalist on call if the hospitalist that took care of you is not available.  Once you are discharged, your primary care physician will handle any further medical issues. Please note that NO REFILLS for any discharge medications will be authorized once you are discharged, as it is imperative that you return to your primary care physician (or establish a relationship with a primary care physician if you do not have one) for your aftercare  needs so that they can reassess your need for medications and monitor your lab values.  Discharge Instructions    Diet - low sodium heart healthy    Complete by:  As directed    Increase activity slowly    Complete by:  As directed      Allergies as of 12/18/2016   No Known Allergies     Medication List    TAKE these medications   cephALEXin 500 MG capsule Commonly known as:  KEFLEX Take 1 capsule (500 mg total) by mouth 2 (two) times daily.   ibuprofen 200 MG tablet Commonly known as:  ADVIL,MOTRIN Take 200 mg by mouth every 6 (six) hours as needed.   nystatin powder Commonly known as:  MYCOSTATIN/NYSTOP Apply topically 2 (two) times daily.   oxyCODONE-acetaminophen 5-325 MG tablet Commonly known as:  PERCOCET/ROXICET Take 1 tablet by mouth every 8 (eight) hours as needed for moderate pain or severe pain.   senna-docusate 8.6-50 MG tablet Commonly known as:  Senokot-S Take 2 tablets by mouth at bedtime.      No Known Allergies Follow-up Information    Betsy Coder, MD Follow up on 12/25/2016.   Specialty:  Oncology Contact information: [place] [address] [phone]        follow up with primary care doctor Follow up in 2 week(s).   Why:  for hospital discharge follow up           The results of significant diagnostics from this hospitalization (including imaging, microbiology, ancillary and laboratory) are listed below for reference.    Significant Diagnostic Studies: Dg Abdomen 1 View  Result Date: 12/06/2016 CLINICAL DATA:  Central abdominal pain and distension. EXAM: ABDOMEN - 1 VIEW COMPARISON:  None. FINDINGS: Normal bowel gas pattern. Lumbar and lower thoracic spine degenerative changes. IMPRESSION: No acute abnormality. Electronically Signed   By: Claudie Revering M.D.   On: 12/06/2016 12:10   Ct Chest Wo Contrast  Result Date: 12/17/2016 CLINICAL DATA:  Ascites and omental disease.  Evaluate lungs. EXAM: CT CHEST  WITHOUT CONTRAST TECHNIQUE: Multidetector CT imaging of the chest was performed following the standard protocol without IV contrast. COMPARISON:  12/16/2016 abdominal CT scan. FINDINGS: Chest wall: No chest wall mass, supraclavicular or axillary lymphadenopathy. The thyroid gland appears normal. Cardiovascular: The heart is normal in size. No pericardial effusion. The aorta is normal in caliber. Scattered coronary artery calcifications. Mediastinum/Nodes: No mediastinal or hilar mass or lymphadenopathy. The esophagus is grossly normal. Lungs/Pleura: No acute pulmonary findings. No worrisome pulmonary nodules. No pleural effusion. There is bibasilar atelectasis and mild eventration of both hemidiaphragms. Upper Abdomen: Moderate 2 large volume upper abdominal ascites. I do not see any obvious morphologic changes of cirrhosis. No splenomegaly. Diffuse omental disease as demonstrated on the prior CT scan. High attenuation anterior MM gallbladder is likely due to vicarious excretion of contrast. Musculoskeletal: No significant bony findings. IMPRESSION: 1. No mediastinal or hilar mass or adenopathy and no pulmonary lesions. 2. Bibasilar atelectasis. 3. Upper abdominal ascites and omental disease as demonstrated on the recent abdominal CT scan. Electronically Signed   By: Marijo Sanes  M.D.   On: 12/17/2016 13:44   Ct Abdomen Pelvis W Contrast  Result Date: 12/16/2016 CLINICAL DATA:  Subacute onset of worsening generalized abdominal distention and pain. Initial encounter. EXAM: CT ABDOMEN AND PELVIS WITH CONTRAST TECHNIQUE: Multidetector CT imaging of the abdomen and pelvis was performed using the standard protocol following bolus administration of intravenous contrast. CONTRAST:  160mL ISOVUE-300 IOPAMIDOL (ISOVUE-300) INJECTION 61% COMPARISON:  Abdominal radiograph performed 12/06/2016 FINDINGS: Lower chest: Mild bibasilar atelectasis is noted. The visualized portions of the mediastinum are otherwise unremarkable.  Hepatobiliary: The liver is grossly unremarkable in appearance. The gallbladder is unremarkable. The common bile duct remains normal in caliber. Pancreas: The pancreas is unremarkable in appearance. Spleen: The spleen is unremarkable in appearance. Adrenals/Urinary Tract: The adrenal glands are unremarkable in appearance. The kidneys are within normal limits. There is no evidence of hydronephrosis. No renal or ureteral stones are identified. Mild nonspecific perinephric stranding is noted bilaterally. Stomach/Bowel: The stomach is unremarkable in appearance. The small bowel is grossly unremarkable, though difficult to fully assess due to surrounding fluid. The appendix is normal in caliber, without evidence of appendicitis. No definite colonic mass is seen. The colon is difficult to fully assess due to surrounding fluid. Vascular/Lymphatic: The abdominal aorta is unremarkable in appearance. The inferior vena cava is grossly unremarkable. No retroperitoneal lymphadenopathy is seen. No pelvic sidewall lymphadenopathy is identified. Reproductive: A small urachal remnant is noted. The bladder is mildly distended and otherwise unremarkable. The prostate is borderline prominent. Other: A large amount of ascites is noted within the abdomen and pelvis. There is diffuse omental caking, more dense at the right upper quadrant, compatible with peritoneal carcinomatosis. Associated recruitment of vasculature is noted. No definite primary malignancy is identified on this study, though it cannot be excluded. Mesothelioma could have such an appearance. Musculoskeletal: No acute osseous abnormalities are identified. The visualized musculature is unremarkable in appearance. IMPRESSION: 1. Diffuse omental caking, more dense at the right upper quadrant, compatible with diffuse peritoneal carcinomatosis. No definite primary malignancy identified on the study, though it cannot be excluded. Mesothelioma could also have such an appearance.  2. Large amount of ascites throughout the abdomen and pelvis. Diagnostic paracentesis is recommended for further evaluation, to assess for the origin of the malignancy. 3. Mild bibasilar atelectasis noted. These results were called by telephone at the time of interpretation on 12/16/2016 at 6:16 pm to Dr. Theotis Burrow, who verbally acknowledged these results. Electronically Signed   By: Garald Balding M.D.   On: 12/16/2016 18:16   US Paracentesis  Result Date: 12/17/2016 INDICATION: ascites EXAM: ULTRASOUND-GUIDED PARACENTESIS COMPARISON:  None. MEDICATIONS: 10 cc 1% lidocaine COMPLICATIONS: None immediate. TECHNIQUE: Informed written consent was obtained from the patient after a discussion of the risks, benefits and alternatives to treatment. A timeout was performed prior to the initiation of the procedure. Initial ultrasound scanning demonstrates a large amount of ascites within the left lower abdominal quadrant. The left lower abdomen was prepped and draped in the usual sterile fashion. 1% lidocaine with epinephrine was used for local anesthesia. Under direct ultrasound guidance, a 19 gauge, 7-cm, Yueh catheter was introduced. An ultrasound image was saved for documentation purposed. The paracentesis was performed. The catheter was removed and a dressing was applied. The patient tolerated the procedure well without immediate post procedural complication. FINDINGS: A total of approximately 6.1 liters of yellow fluid was removed. Samples were sent to the laboratory as requested by the clinical team. IMPRESSION: Successful ultrasound-guided paracentesis yielding 6.1 liters of peritoneal  fluid. Read by Lavonia Drafts [facility] Electronically Signed   By: Corrie Mckusick D.O.   On: 12/17/2016 12:44   Dg Chest Port 1 View  Result Date: 12/16/2016 CLINICAL DATA:  Dyspnea on exertion x2 weeks. EXAM: PORTABLE CHEST 1 VIEW COMPARISON:  None. FINDINGS: Low lung volumes with bibasilar atelectasis. Heart is normal in  size. No aortic aneurysm. No suspicious osseous lesions. IMPRESSION: Low lung volumes with bibasilar atelectasis. Electronically Signed   By: Ashley Royalty M.D.   On: 12/16/2016 23:18    Microbiology: Recent Results (from the past 240 hour(s))  Gram stain     Status: None   Collection Time: 12/17/16 12:19 PM  Result Value Ref Range Status   Specimen Description FLUID PERITONEAL  Final   Special Requests NONE  Final   Gram Stain   Final    MODERATE WBC PRESENT,BOTH PMN AND MONONUCLEAR NO ORGANISMS SEEN    Report Status 12/17/2016 FINAL  Final     Labs: Basic Metabolic Panel:  Recent Labs Lab 12/16/16 1209 12/17/16 0459 12/18/16 0557  NA 139 135 137  K 4.3 3.6 3.9  CL 100* 99* 100*  CO2 27 25 29   GLUCOSE 80 81 95  BUN 9 8 7   CREATININE 1.07 1.00 1.01  CALCIUM 9.1 8.6* 8.3*  MG  --   --  1.8   Liver Function Tests:  Recent Labs Lab 12/16/16 1209 12/17/16 0459  AST 34 35  ALT 38 39  ALKPHOS 70 61  BILITOT 0.6 0.4  PROT 6.4* 5.7*  ALBUMIN 3.2* 2.8*    Recent Labs Lab 12/16/16 1209  LIPASE 20   No results for input(s): AMMONIA in the last 168 hours. CBC:  Recent Labs Lab 12/16/16 1209 12/17/16 0459  WBC 8.6 8.3  HGB 16.9 15.6  HCT 47.5 44.3  MCV 90.5 90.2  PLT 385 349   Cardiac Enzymes: No results for input(s): CKTOTAL, CKMB, CKMBINDEX, TROPONINI in the last 168 hours. BNP: BNP (last 3 results)  Recent Labs  12/16/16 1209  BNP 8.5    ProBNP (last 3 results) No results for input(s): PROBNP in the last 8760 hours.  CBG: No results for input(s): GLUCAP in the last 168 hours.     SignedFlorencia Reasons MD, PhD  [facility] 12/18/2016, 11:54 AM

## 2016-12-19 LAB — CEA: CEA: 0.4 ng/mL (ref 0.0–4.7)

## 2016-12-19 LAB — AFP TUMOR MARKER: AFP TUMOR MARKER: 1.5 ng/mL (ref 0.0–8.3)

## 2016-12-22 LAB — CULTURE, BODY FLUID-BOTTLE: CULTURE: NO GROWTH

## 2016-12-22 LAB — CULTURE, BODY FLUID W GRAM STAIN -BOTTLE

## 2016-12-25 ENCOUNTER — Encounter: Payer: Self-pay | Admitting: *Deleted

## 2016-12-25 ENCOUNTER — Ambulatory Visit: Payer: PRIVATE HEALTH INSURANCE | Attending: Oncology | Admitting: Physical Therapy

## 2016-12-25 ENCOUNTER — Telehealth: Payer: Self-pay | Admitting: Oncology

## 2016-12-25 ENCOUNTER — Telehealth: Payer: Self-pay | Admitting: *Deleted

## 2016-12-25 ENCOUNTER — Ambulatory Visit (HOSPITAL_BASED_OUTPATIENT_CLINIC_OR_DEPARTMENT_OTHER): Payer: PRIVATE HEALTH INSURANCE | Admitting: Oncology

## 2016-12-25 ENCOUNTER — Encounter: Payer: Self-pay | Admitting: Physical Therapy

## 2016-12-25 ENCOUNTER — Ambulatory Visit: Payer: PRIVATE HEALTH INSURANCE | Admitting: Nutrition

## 2016-12-25 ENCOUNTER — Ambulatory Visit: Payer: Self-pay | Admitting: Physician Assistant

## 2016-12-25 ENCOUNTER — Ambulatory Visit (HOSPITAL_COMMUNITY)
Admission: RE | Admit: 2016-12-25 | Discharge: 2016-12-25 | Disposition: A | Discharge status: Home / Self Care | Payer: PRIVATE HEALTH INSURANCE | Source: Ambulatory Visit | Attending: Oncology | Admitting: Oncology

## 2016-12-25 VITALS — BP 136/89 | HR 84 | Temp 98.0°F | Resp 18 | Ht 70.0 in | Wt 242.2 lb

## 2016-12-25 DIAGNOSIS — M6281 Muscle weakness (generalized): Secondary | ICD-10-CM | POA: Insufficient documentation

## 2016-12-25 DIAGNOSIS — C786 Secondary malignant neoplasm of retroperitoneum and peritoneum: Secondary | ICD-10-CM | POA: Insufficient documentation

## 2016-12-25 DIAGNOSIS — R109 Unspecified abdominal pain: Secondary | ICD-10-CM | POA: Insufficient documentation

## 2016-12-25 DIAGNOSIS — C801 Malignant (primary) neoplasm, unspecified: Secondary | ICD-10-CM | POA: Diagnosis not present

## 2016-12-25 DIAGNOSIS — R180 Malignant ascites: Secondary | ICD-10-CM

## 2016-12-25 HISTORY — PX: PARACENTESIS: SHX844

## 2016-12-25 NOTE — Progress Notes (Signed)
Patient was seen in GI clinic.  56 year old male diagnosed with peritoneal carcinomatosis and is a patient of Dr. Julieanne Manson.  Past medical history was reviewed.  Medications include Zofran, and senna S  Labs were reviewed.  Height: 5 feet 10 inches. Weight: 242 pounds. Usual body weight: 220-225 pounds. BMI: Not accurate secondary to presence of ascites.  Patient status post 6.1 L of fluid removed in the hospital. He reports he is having additional fluids removed today. He states he has early satiety and increased pain when he eats. Reports he consumes approximately 25% of what he used to eat. He is eating 3 meals but no snacks. Patient denies difficulties chewing or swallowing and reports no nausea or vomiting at this time.  Nutrition diagnosis:  Food and nutrition related knowledge deficit related to new diagnosis of cancer as evidenced by no prior need for nutrition related information.  Intervention: Patient was educated to consume high-calorie, high-protein foods in small frequent meals and snacks. Encouraged and reviewed high protein foods and provided a fact sheet. Encouraged maintenance of lean body mass. Recommended patient try oral nutrition supplements or high-protein shakes. Questions were answered.  Teach back method used.  Fact sheets were given.  Patient was provided with my contact information.  Monitoring, evaluation, goals:  Patient will tolerate increased calories and protein minimize loss of lean body mass.  Next visit: To be scheduled with chemotherapy.  **Disclaimer: This note was dictated with voice recognition software. Similar sounding words can inadvertently be transcribed and this note may contain transcription errors which may not have been corrected upon publication of note.**

## 2016-12-25 NOTE — Progress Notes (Signed)
[facility] New Patient Consult   Referring MD: Florencia Reasons, [address] [place], [place] [address]   Frank Tucker 56 y.o.  1961/05/31    Reason for Referral: Abdominal carcinomatosis   HPI: Frank Tucker reports developing a distended abdomen in mid December 2017. He presented to the emergency room on 12/16/2016. A CT of the abdomen and pelvis revealed a large amount of ascites with diffuse omental caking. No evidence of a primary tumor site. He underwent an ultrasound paracentesis for 6 L of fluid on 12/17/2016. The cytology [id number]) revealed malignant cells consistent with carcinoma. Lungs cells are positive for cytokeratin 7 and WT-1, negative for cytokeratin 20, CDX-2, and TTF-1.  A CT the chest 12/17/2016 revealed no adenopathy or pulmonary lesion.  The abdominal fluid has reaccumulated over the past week.  Past medical history: None  Past surgical history: None  Medications: Reviewed  Allergies: No Known Allergies  Family history: His mother had "brain cancer "  Social History:   He lives with his wife and children in [place]. He works as a off Sales executive. He does not use cigarettes. He reports no alcohol for the past 15 years-previous weekend drinker   ROS:   Positives include: Distended abdomen, anorexia, early satiety, postprandial abdominal pain, dark urine, rationale groin  A complete ROS was otherwise negative.  Physical Exam:  Blood pressure 136/89, pulse 84, temperature 98 F (36.7 C), temperature source Oral, resp. rate 18, height 5\' 10"  (1.778 m), weight 242 lb 3.2 oz (109.9 kg), SpO2 98 %.  HEENT: Oropharynx without visible mass, neck without mass Lungs: Clear bilaterally Cardiac: Regular rate and rhythm Abdomen: Markedly distended, tender in the right upper abdomen, no mass, no hepatosplenomegaly GU: Testes without mass  Vascular: Trace pitting edema at the low leg bilaterally Lymph nodes: No cervical,  supraclavicular, axillary, or inguinal nodes Neurologic: Alert and oriented, the motor exam appears intact in the upper and lower extremities Skin: Yeast rash in the groin bilaterally Musculoskeletal: No spine tenderness   LAB:  CBC  Lab Results  Component Value Date   WBC 8.3 12/17/2016   HGB 15.6 12/17/2016   HCT 44.3 12/17/2016   MCV 90.2 12/17/2016   PLT 349 12/17/2016   NEUTROABS 6.7 12/06/2016     CMP      Component Value Date/Time   NA 137 12/18/2016 0557   K 3.9 12/18/2016 0557   CL 100 (L) 12/18/2016 0557   CO2 29 12/18/2016 0557   GLUCOSE 95 12/18/2016 0557   BUN 7 12/18/2016 0557   CREATININE 1.01 12/18/2016 0557   CALCIUM 8.3 (L) 12/18/2016 0557   PROT 5.7 (L) 12/17/2016 0459   ALBUMIN 2.8 (L) 12/17/2016 0459   AST 35 12/17/2016 0459   ALT 39 12/17/2016 0459   ALKPHOS 61 12/17/2016 0459   BILITOT 0.4 12/17/2016 0459   GFRNONAA >60 12/18/2016 0557   GFRAA >60 12/18/2016 0557    Imaging:  US Paracentesis  Result Date: 12/25/2016 INDICATION: Peritoneal carcinomatosis, recurrent malignant ascites; request made for therapeutic paracentesis. EXAM: ULTRASOUND GUIDED THERAPEUTIC PARACENTESIS MEDICATIONS: None. COMPLICATIONS: None immediate. PROCEDURE: Informed written consent was obtained from the patient after a discussion of the risks, benefits and alternatives to treatment. A timeout was performed prior to the initiation of the procedure. Initial ultrasound scanning demonstrates a large amount of ascites within the left lower abdominal quadrant. The left lower abdomen was prepped and draped in the usual sterile fashion. 1% lidocaine was used for  local anesthesia. Following this, a Yueh catheter was introduced. An ultrasound image was saved for documentation purposes. The paracentesis was performed. The catheter was removed and a dressing was applied. The patient tolerated the procedure well without immediate post procedural complication. FINDINGS: A total of  approximately 6 liters of yellow fluid was removed. IMPRESSION: Successful ultrasound-guided therapeutic paracentesis yielding 6 liters of peritoneal fluid. Read by: Rowe Robert, PA-C Electronically Signed   By: Aletta Edouard M.D.   On: 12/25/2016 15:17      Assessment/Plan:   1. Abdominal carcinomatosis  Paracentesis on 12/17/2016 confirmed malignant cells consistent with carcinoma, cytokeratin and WT-1 positive  CTs of the chest, abdomen, and pelvis with ascites and omental caking. No other evidence of metastatic disease and no primary tumor site identified    Disposition:   Frank Tucker has been diagnosed with metastatic carcinoma involving ascites. No primary tumor site has been identified on the staging CT scans. His case was presented at the GI tumor conference on 12/23/2016. It was recommended he be referred to gastroenterology for an upper endoscopy. The differential diagnosis includes peritoneal mesothelioma.  Frank Tucker will be referred for a staging PET scan. We will plan for a core biopsy of omental caking if an upper endoscopy is nondiagnostic.  He will return for an office visit and further discussion on 01/04/2017. He will be referred for a palliative paracentesis today.  Approximately 50 minutes were spent with the patient today. The majority of the time was used for counseling and coordination of care.  Betsy Coder, MD  12/25/2016, 5:29 PM

## 2016-12-25 NOTE — Progress Notes (Signed)
[facility] Psychosocial Distress Screening Clinical Social Work  Clinical Social Work met with pt and his 55 yo son, Chanson at [employer] to introduce self, explain role of CSW/Support Team and to was review the distress screening protocol.  The patient scored a 0 on the Psychosocial Distress Thermometer which indicates no distress. Clinical Social Worker met with pt and son to assess for distress and other psychosocial needs. 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 He was not aware of the severity of his cancer diagnosis today. He is eager to get his affairs in order as he was told he is incurable. Pt lives with his wife, Manuela Schwartz and two children Anibal Quinby and daughter Cristie Hem. He reports his wife does not work and suffers from alcoholism. His son will be starting college at [employer] in [place] in a few weeks and his daughter Cristie Hem is a senior in high school with plans to attend [employer] in the fall. Pt appears to have strong support though his church, his close group of friends and his children. He shared he and children cannot count on his wife for support. He is eager to plan ahead for end of life as a result. CSW reviewed all resources, coping options and supports through [facility] in detail. CSW also provided pt with an Advance Life planning financial booklet and coping with advanced cancer booklet as well. Pt had requested these items. Pt appears very open to attend support programs. CSW to follow closely for support.   ONCBCN DISTRESS SCREENING 12/25/2016  Screening Type Initial Screening  Distress experienced in past week (1-10) 0  Physical Problem type Pain;Skin dry/itchy;Swollen arms/legs  Physician notified of physical symptoms Yes  Referral to support programs Yes    Clinical Social Worker follow up needed: Yes.    If yes, follow up plan: See above Loren Racer, [place] Worker [place]  [facility] Phone: [phone] Fax: [phone]

## 2016-12-25 NOTE — Telephone Encounter (Signed)
Spoke with pt, he reports relief of pain after paracentesis procedure. Pt is aware of all upcoming appointments. He understands to call office prior to next visit with any questions or issues.

## 2016-12-25 NOTE — Progress Notes (Signed)
Oncology Nurse Navigator Documentation  Oncology Nurse Navigator Flowsheets 12/25/2016  Navigator Location [facility]  Referral date to RadOnc/MedOnc -  Navigator Encounter Type Clinic/MDC  Abnormal Finding Date -  Multidisiplinary Clinic Date 12/25/2016  Patient Visit Type MedOnc;Initial  Treatment Phase Pre-Tx/Tx Discussion  Barriers/Navigation Needs Education;Coordination of Care  Education Accessing Care/ Finding Providers;Preparing for Upcoming Treatment--  Interventions Coordination of Care;Education  Coordination of Care Appts--scheduled to see Dr. Loletha Carrow on 12/28/16 at 2pm (needs upper endo); collaborative nurse scheduled him for paracentesis today at 2pm.  Education Method Verbal;Written  Support Groups/Services GI Support Group  Acuity Level 2  Time Spent with Patient 5  Met with patient and his son during new patient visit. Explained the role of the GI Nurse Navigator and provided New Patient Packet with information on: 1.  PET scan 2. Support groups 3. Advanced Directives 4. Fall Safety Plan Answered questions, reviewed current treatment plan using TEACH back and provided emotional support. Provided copy of current treatment plan. Informed him that his PET will be scheduled after the authorization is done. Explained the rationale for the scan and upper endoscopy to determine the primary source of his cancer and other potential area to biopsy if necessary. CSW spent significant amount of time with him today--has a stressful social situation. Escorted patient and his son to scheduling area after his visit today. Due to time of PET on 1/12, visit with Dr. Benay Spice was moved to 01/04/17 at 1130. Patient notified of this and PET scan appointment, prep and given a map to [place].  Merceda Elks, RN, BSN GI Oncology [place]

## 2016-12-25 NOTE — Procedures (Signed)
Ultrasound-guided therapeutic paracentesis performed yielding 6 liters of yellow fluid. No immediate complications.  

## 2016-12-25 NOTE — Telephone Encounter (Signed)
Appointments scheduled per 1/5 LOS. Patient given AVS report and calendars with future scheduled appointments. Patient aware of PET scan appointment to be scheduled. Called [facility] to schedule PET scan.

## 2016-12-25 NOTE — Therapy (Signed)
[facility] [address], [place], [place], [address] Phone: [phone]   Fax:  [phone]  Physical Therapy Evaluation  Patient Details  Name: Frank Tucker MRN: [id number] Date of Birth: September 25, 1961 Referring Provider: Dr. Betsy Coder  Encounter Date: 12/25/2016      PT End of Session - 12/25/16 1215    Visit Number 1   PT Start Time [id number]   PT Stop Time 1234   PT Time Calculation (min) 24 min   Activity Tolerance Patient tolerated treatment well   Behavior During Therapy [facility] for tasks assessed/performed      History reviewed. No pertinent past medical history.  History reviewed. No pertinent surgical history.  There were no vitals filed for this visit.       Subjective Assessment - 12/25/16 1234    Subjective Patient is attending GI clinic    Patient is accompained by: Family member  son   Patient Stated Goals education   Currently in Pain? Yes   Pain Score 5    Pain Location Abdomen   Pain Orientation Upper;Lower;Anterior   Pain Descriptors / Indicators Constant;Dull   Pain Type Acute pain   Pain Onset More than a month ago   Pain Frequency Constant   Aggravating Factors  eating   Pain Relieving Factors not sure   Effect of Pain on Daily Activities laying down, exercise   Multiple Pain Sites No            [facility] PT Assessment - 12/25/16 0001      Assessment   Medical Diagnosis Peritoneal Carinomatosis   Referring Provider Dr. Betsy Coder   Onset Date/Surgical Date 12/17/17   Prior Therapy none     Precautions   Precautions Other (comment)   Precaution Comments cancer precautions     Restrictions   Weight Bearing Restrictions No     Balance Screen   Has the patient fallen in the past 6 months No   Has the patient had a decrease in activity level because of a fear of falling?  No   Is the patient reluctant to leave their home because of a fear of falling?  No     Home  Ec[place] residence     Prior Function   Level of Independence Independent   Vocation Full time employment     Cognition   Overall Cognitive Status Within Functional Limits for tasks assessed     Observation/Other Assessments   Focus on Therapeutic Outcomes (FOTO)  Therapist discretion is 15% limitation due to abdomen distended making it difficult to do things     Posture/Postural Control   Posture/Postural Control Postural limitations   Posture Comments distended abdomen     ROM / Strength   AROM / PROM / Strength AROM;Strength     AROM   Overall AROM  Within functional limits for tasks performed     Strength   Overall Strength Comments UE strength is 4/5 and LE strength is 5/5     Transfers   Transfers Supine to Sit   Supine to Sit 6: Modified independent (Device/Increase time)  trouble when abdomen is filled with fluid     Ambulation/Gait   Ambulation/Gait No     Balance   Balance Assessed No                           PT Education - 12/25/16 1214  Education provided Yes   Education Details walking program; Tips to conserve energy; balance exercises   Person(s) Educated Patient   Methods Explanation;Demonstration;Handout   Comprehension Returned demonstration;Verbalized understanding             PT Long Term Goals - 12/25/16 1244      PT LONG TERM GOAL #1   Title independent with HEP including balance exercises and walking program   Time 1   Period Days   Status Achieved     PT LONG TERM GOAL #2   Title understand ways to conserve energy during treatment   Time 1   Period Days   Status Achieved               Plan - 12/25/16 1215    Clinical Impression Statement Patient is a 56 year old male with diagnosis of peritoneal caricinomatosis on 12/17/2016. Patient abdomen swells up and needs to be drained to reduce.  Patient reports dull constant abdominal pain at level 5/10 that is worse with  eating. Patient reports no falls in the past 6 months.  Patient has access to exercise equipment at home but does not use it. Bilateral upper extremity strength is 5/5. Patient has difficulty with going from supine to standing due to his abdomen being distended.  Patient is low complexity due to evolving condition with abdomen swelling but 1 comorbidity that includes peritoneal carcinomatosis that could impact care. Patient benefit from skilled therapy to be instructed on walking program, balance exercises, and tips to conserve energy.     Rehab Potential Good   Clinical Impairments Affecting Rehab Potential back issues   PT Frequency 1x / week   PT Duration Other (comment)  1 time session in GI clinic   PT Treatment/Interventions Therapeutic exercise;Therapeutic activities;Patient/family education   PT Next Visit Plan Discharge to HEP   PT Home Exercise Plan Current HEP   Recommended Other Services In the future may benefit from physical therapy if has difficulty with HEP   Consulted and Agree with Plan of Care Patient;Family member/caregiver   Family Member Consulted son      Patient will benefit from skilled therapeutic intervention in order to improve the following deficits and impairments:  Decreased strength, Decreased mobility  Visit Diagnosis: Muscle weakness (generalized) - Plan: PT plan of care cert/re-cert     Problem List Patient Active Problem List   Diagnosis Date Noted  . Peritoneal carcinomatosis ([place]) 12/16/2016  . Malignant ascites 12/16/2016  . Ascites 12/16/2016    [name], PT 12/25/16 12:47 PM    [facility] [address] [place], [place], [address] Phone: [phone]   Fax:  [phone]  Name: Frank Tucker MRN: [id number] Date of Birth: 01-19-1961

## 2016-12-25 NOTE — Patient Instructions (Signed)
  Tips for Energy Conservation for Activities of Daily Living . Plan ahead to avoid rushing. . Sit down to bathe and dry off. Wear a terry robe instead of drying off. . Use a shower/bath organizer to decrease leaning and reaching. . Use extension handles on sponges and brushes. Susa Simmonds grab rails in the bathroom or use an elevated toilet seat. Hoyle Barr out clothes and toiletries before dressing. . Minimize leaning over to put on clothes and shoes. Bring your foot to your knee to apply socks and shoes. . Wear comfortable shoes and low-heeled, slip on shoes. Wear button front shirts rather than pullovers. Housekeeping . Schedule household tasks throughout the week. . Do housework sitting down when possible. . Delegate heavy housework, shopping, laundry and child care when possible. . Drag or slide objects rather than lifting. . Sit when ironing and take rest periods. . Stop working before becoming overly tired. Shopping . Organize list by aisle. . Use a grocery cart for support. Marland Kitchen Shop at less busy times. . Ask for help with getting to the car. Meal Preparation . Use convenience and easy-to-prepare foods. . Use small appliances that take less effort to use. Marland Kitchen Prepare meals sitting down. . Soak dishes instead of scrubbing and let dishes air dry. . Prepare double portions and freeze half. Child Care . Plan activities that can be done sitting down, such as drawing pictures, playing games, reading, and computer games. . Encourage children to climb up onto your lap or into the highchair instead of being lifted. . Make a game of the household chores so that children will want to help. . Delegate child care when possible.  [name], PT, [place] [facility] [place]; [place], [place], [place] [address] Tandem Stance    Can hold onto counter if not feeling steady. Right foot in front of left, heel touching toe both feet "straight ahead". Stand on Foot  Triangle of Support with both feet. Balance in this position _30__ seconds. Do with left foot in front of right. 3 times each  Copyright  VHI. All rights reserved.    Stance: single leg on floor. Raise leg. Hold _15__ seconds. Repeat with other leg. __3_ reps per set, _1__ sets per day .  Can hold onto counter to keep steady  Copyright  VHI. All rights reserved.    Ways to get started on an exercise program 1.  Start for 10 minutes per day with a walking program. 2. Work towards 30 minutes of exercise per day 3. When you do an aerobic exercise program start on a low level 4. Water aerobics is a good place due to decreased strain on your joints 5. Begin your exercise program gradually and progress slowly over time 6. When exercising use correct form. a. Keep neutral spine b. Engage abdominals c. Keep chest up  d. Chin down e. Do not lock your knees  [name], PT Outpatient Rehab at [facility] [address] [place], [place] [address] [phone]

## 2016-12-28 ENCOUNTER — Telehealth: Payer: Self-pay

## 2016-12-28 ENCOUNTER — Encounter (HOSPITAL_COMMUNITY): Payer: Self-pay | Admitting: *Deleted

## 2016-12-28 ENCOUNTER — Other Ambulatory Visit: Payer: Self-pay

## 2016-12-28 ENCOUNTER — Ambulatory Visit: Payer: PRIVATE HEALTH INSURANCE | Admitting: Gastroenterology

## 2016-12-28 DIAGNOSIS — R180 Malignant ascites: Secondary | ICD-10-CM

## 2016-12-28 NOTE — Progress Notes (Signed)
Prep. instructions

## 2016-12-28 NOTE — Telephone Encounter (Signed)
Spoke to patient, he is aware that he is scheduled for EGD at [place] on 1/9, arrive at 9:15 for a 11:15. Patient given verbal instructions on prep, understands to have someone to come with him to drive him home. Dr. Aleatha Borer to cancel patients office appointment for today.

## 2016-12-29 ENCOUNTER — Other Ambulatory Visit: Payer: Self-pay | Admitting: *Deleted

## 2016-12-29 ENCOUNTER — Ambulatory Visit (HOSPITAL_COMMUNITY)
Admission: RE | Admit: 2016-12-29 | Discharge: 2016-12-29 | Disposition: A | Discharge status: Home / Self Care | Payer: PRIVATE HEALTH INSURANCE | Source: Ambulatory Visit | Attending: Gastroenterology | Admitting: Gastroenterology

## 2016-12-29 ENCOUNTER — Telehealth: Payer: Self-pay | Admitting: *Deleted

## 2016-12-29 ENCOUNTER — Ambulatory Visit (HOSPITAL_COMMUNITY): Payer: PRIVATE HEALTH INSURANCE | Admitting: Anesthesiology

## 2016-12-29 ENCOUNTER — Encounter (HOSPITAL_COMMUNITY): Payer: Self-pay

## 2016-12-29 ENCOUNTER — Encounter (HOSPITAL_COMMUNITY)
Admission: RE | Disposition: A | Discharge status: Home / Self Care | Payer: Self-pay | Source: Ambulatory Visit | Attending: Gastroenterology

## 2016-12-29 DIAGNOSIS — R933 Abnormal findings on diagnostic imaging of other parts of digestive tract: Secondary | ICD-10-CM | POA: Diagnosis not present

## 2016-12-29 DIAGNOSIS — Z79899 Other long term (current) drug therapy: Secondary | ICD-10-CM | POA: Diagnosis not present

## 2016-12-29 DIAGNOSIS — C801 Malignant (primary) neoplasm, unspecified: Secondary | ICD-10-CM | POA: Insufficient documentation

## 2016-12-29 DIAGNOSIS — R180 Malignant ascites: Secondary | ICD-10-CM

## 2016-12-29 DIAGNOSIS — C786 Secondary malignant neoplasm of retroperitoneum and peritoneum: Secondary | ICD-10-CM

## 2016-12-29 DIAGNOSIS — R935 Abnormal findings on diagnostic imaging of other abdominal regions, including retroperitoneum: Secondary | ICD-10-CM

## 2016-12-29 DIAGNOSIS — K210 Gastro-esophageal reflux disease with esophagitis: Secondary | ICD-10-CM

## 2016-12-29 HISTORY — PX: ESOPHAGOGASTRODUODENOSCOPY (EGD) WITH PROPOFOL: SHX5813

## 2016-12-29 HISTORY — DX: Other ascites: R18.8

## 2016-12-29 SURGERY — ESOPHAGOGASTRODUODENOSCOPY (EGD) WITH PROPOFOL
Anesthesia: Monitor Anesthesia Care

## 2016-12-29 MED ORDER — PROPOFOL 10 MG/ML IV BOLUS
INTRAVENOUS | Status: DC | PRN
  Administered 2016-12-29: 50 mg via INTRAVENOUS
  Administered 2016-12-29: 20 mg via INTRAVENOUS
  Administered 2016-12-29: 40 mg via INTRAVENOUS
  Administered 2016-12-29: 20 mg via INTRAVENOUS

## 2016-12-29 MED ORDER — SODIUM CHLORIDE 0.9 % IV SOLN: INTRAVENOUS | Status: DC

## 2016-12-29 MED ORDER — LIDOCAINE 2% (20 MG/ML) 5 ML SYRINGE
INTRAMUSCULAR | Status: DC | PRN
  Administered 2016-12-29: 100 mg via INTRAVENOUS

## 2016-12-29 MED ORDER — PROPOFOL 10 MG/ML IV BOLUS
INTRAVENOUS | Status: AC
  Filled 2016-12-29: qty 40

## 2016-12-29 MED ORDER — PROPOFOL 500 MG/50ML IV EMUL
INTRAVENOUS | Status: DC | PRN
  Administered 2016-12-29: 100 ug/kg/min via INTRAVENOUS

## 2016-12-29 MED ORDER — LACTATED RINGERS IV SOLN
INTRAVENOUS | Status: DC
  Administered 2016-12-29: 11:00:00 via INTRAVENOUS

## 2016-12-29 MED ORDER — LIDOCAINE 2% (20 MG/ML) 5 ML SYRINGE
INTRAMUSCULAR | Status: AC
  Filled 2016-12-29: qty 5

## 2016-12-29 SURGICAL SUPPLY — 14 items

## 2016-12-29 NOTE — Anesthesia Postprocedure Evaluation (Signed)
Anesthesia Post Note  Patient: Frank Tucker  Procedure(s) Performed: Procedure(s) (LRB): ESOPHAGOGASTRODUODENOSCOPY (EGD) WITH PROPOFOL (N/A)  Patient location during evaluation: PACU Anesthesia Type: MAC Level of consciousness: awake and alert Pain management: pain level controlled Vital Signs Assessment: post-procedure vital signs reviewed and stable Respiratory status: spontaneous breathing, nonlabored ventilation, respiratory function stable and patient connected to nasal cannula oxygen Cardiovascular status: stable and blood pressure returned to baseline Anesthetic complications: no       Last Vitals:  Vitals:   12/29/16 1110 12/29/16 1120  BP: 113/71 125/78  Pulse: 76 75  Resp: 17 18  Temp:      Last Pain:  Vitals:   12/29/16 1103  TempSrc: Oral                 Jacole Capley S

## 2016-12-29 NOTE — Transfer of Care (Signed)
Immediate Anesthesia Transfer of Care Note  Patient: Frank Tucker  Procedure(s) Performed: Procedure(s): ESOPHAGOGASTRODUODENOSCOPY (EGD) WITH PROPOFOL (N/A)  Patient Location: PACU  Anesthesia Type:MAC  Level of Consciousness: Patient easily awoken, sedated, comfortable, cooperative, following commands, responds to stimulation.   Airway & Oxygen Therapy: Patient spontaneously breathing, ventilating well, oxygen via simple oxygen mask.  Post-op Assessment: Report given to PACU RN, vital signs reviewed and stable, moving all extremities.   Post vital signs: Reviewed and stable.  Complications: No apparent anesthesia complications Last Vitals:  Vitals:   12/29/16 0953  BP: 129/68  Pulse: 82  Resp: 17  Temp: 36.7 C    Last Pain:  Vitals:   12/29/16 0953  TempSrc: Oral         Complications: No apparent anesthesia complications

## 2016-12-29 NOTE — Discharge Instructions (Signed)
YOU HAD AN ENDOSCOPIC PROCEDURE TODAY: Refer to the procedure report and other information in the discharge instructions given to you for any specific questions about what was found during the examination. If this information does not answer your questions, please call [place] office at [phone] to clarify.  ° °YOU SHOULD EXPECT: Some feelings of bloating in the abdomen. Passage of more gas than usual. Walking can help get rid of the air that was put into your GI tract during the procedure and reduce the bloating. If you had a lower endoscopy (such as a colonoscopy or flexible sigmoidoscopy) you may notice spotting of blood in your stool or on the toilet paper. Some abdominal soreness may be present for a day or two, also. ° °DIET: Your first meal following the procedure should be a light meal and then it is ok to progress to your normal diet. A half-sandwich or bowl of soup is an example of a good first meal. Heavy or fried foods are harder to digest and may make you feel nauseous or bloated. Drink plenty of fluids but you should avoid alcoholic beverages for 24 hours. If you had a esophageal dilation, please see attached instructions for diet.   ° °ACTIVITY: Your care partner should take you home directly after the procedure. You should plan to take it easy, moving slowly for the rest of the day. You can resume normal activity the day after the procedure however YOU SHOULD NOT DRIVE, use power tools, machinery or perform tasks that involve climbing or major physical exertion for 24 hours (because of the sedation medicines used during the test).  ° °SYMPTOMS TO REPORT IMMEDIATELY: °A gastroenterologist can be reached at any hour. Please call [phone]  for any of the following symptoms:  °Following lower endoscopy (colonoscopy, flexible sigmoidoscopy) °Excessive amounts of blood in the stool  °Significant tenderness, worsening of abdominal pains  °Swelling of the abdomen that is new, acute  °Fever of 100° or  higher  °Following upper endoscopy (EGD, EUS, ERCP, esophageal dilation) °Vomiting of blood or coffee ground material  °New, significant abdominal pain  °New, significant chest pain or pain under the shoulder blades  °Painful or persistently difficult swallowing  °New shortness of breath  °Black, tarry-looking or red, bloody stools ° °FOLLOW UP:  °If any biopsies were taken you will be contacted by phone or by letter within the next 1-3 weeks. Call [phone]  if you have not heard about the biopsies in 3 weeks.  °Please also call with any specific questions about appointments or follow up tests. ° °

## 2016-12-29 NOTE — H&P (Signed)
History:  This patient presents for endoscopic testing for malignant ascites, ? Primary source. Dr Gearldine Shown oncology consult note from 12/25/16 was reviewed.  Derenda Fennel Referring physician: No PCP Per Patient  Past Medical History: Past Medical History:  Diagnosis Date  . Ascites      Past Surgical History: Past Surgical History:  Procedure Laterality Date  . PARACENTESIS  12/25/2016    Allergies: No Known Allergies  Outpatient Meds: Current Facility-Administered Medications  Medication Dose Route Frequency Provider Last Rate Last Dose  . 0.9 %  sodium chloride infusion   Intravenous Continuous Nelida Meuse III, MD      . lactated ringers infusion   Intravenous Continuous Albertha Ghee, MD          ___________________________________________________________________ Objective   Exam:  BP 129/68   Pulse 82   Temp 98 F (36.7 C) (Oral)   Resp 17   Ht 5\' 10"  (1.778 m)   Wt 240 lb (108.9 kg)   SpO2 96%   BMI 34.44 kg/m    CV: RRR without murmur, S1/S2, no JVD, no peripheral edema  Resp: clear to auscultation bilaterally, normal RR and effort noted.  Breathing comfortably  GI: large amount of ascites.  soft, no tenderness, with active bowel sounds. No guarding or palpable organomegaly noted.  Neuro: awake, alert and oriented x 3. Normal gross motor function and fluent speech   Assessment:  Malignant ascites  Plan:  EGD   Nelida Meuse III

## 2016-12-29 NOTE — Anesthesia Preprocedure Evaluation (Signed)
Anesthesia Evaluation  Patient identified by MRN, date of birth, ID band Patient awake    Reviewed: Allergy & Precautions, H&P , NPO status , Patient's Chart, lab work & pertinent test results  Airway Mallampati: II   Neck ROM: full    Dental   Pulmonary neg pulmonary ROS,    breath sounds clear to auscultation       Cardiovascular negative cardio ROS   Rhythm:regular Rate:Normal     Neuro/Psych    GI/Hepatic 12/25/2016: paracentesis for ascites.  Peritoneal carcinomatosis.   Endo/Other    Renal/GU      Musculoskeletal   Abdominal   Peds  Hematology   Anesthesia Other Findings   Reproductive/Obstetrics                             Anesthesia Physical Anesthesia Plan  ASA: II  Anesthesia Plan: MAC   Post-op Pain Management:    Induction: Intravenous  Airway Management Planned: Nasal Cannula  Additional Equipment:   Intra-op Plan:   Post-operative Plan:   Informed Consent: I have reviewed the patients History and Physical, chart, labs and discussed the procedure including the risks, benefits and alternatives for the proposed anesthesia with the patient or authorized representative who has indicated his/her understanding and acceptance.     Plan Discussed with: CRNA, Anesthesiologist and Surgeon  Anesthesia Plan Comments:         Anesthesia Quick Evaluation

## 2016-12-29 NOTE — Telephone Encounter (Signed)
Call placed to patient to inform him that paracentesis has been scheduled for Friday, 01/01/17 per order of Dr. Benay Spice.  Pt instructed to arrive at radiology at 2:15PM for procedure at 3:00PM.  Patient appreciative of call and has no questions at this time.

## 2016-12-29 NOTE — Interval H&P Note (Signed)
History and Physical Interval Note:  12/29/2016 10:16 AM  Frank Tucker  has presented today for surgery, with the diagnosis of malignant ascites  The various methods of treatment have been discussed with the patient and family. After consideration of risks, benefits and other options for treatment, the patient has consented to  Procedure(s): ESOPHAGOGASTRODUODENOSCOPY (EGD) WITH PROPOFOL (N/A) as a surgical intervention .  The patient's history has been reviewed, patient examined, no change in status, stable for surgery.  I have reviewed the patient's chart and labs.  Questions were answered to the patient's satisfaction.     Nelida Meuse III

## 2016-12-29 NOTE — Op Note (Signed)
[facility] Patient Name: Frank Tucker Procedure Date: 12/29/2016 MRN: [id number] Attending MD: Estill Cotta. Loletha Carrow , MD Date of Birth: 31-Jan-1961 CSN: [id number] Age: 56 Admit Type: Outpatient Procedure:                Upper GI endoscopy Indications:              Abnormal CT of the GI tract (malignant ascites, ?                            primary source) Providers:                Estill Cotta. Loletha Carrow, MD, Hilma Favors, RN, Cletis Athens,                            Technician Referring MD:             Izola Price. Sherrill Medicines:                Monitored Anesthesia Care Complications:            No immediate complications. Estimated Blood Loss:     Estimated blood loss: none. Procedure:                Pre-Anesthesia Assessment:                           - Prior to the procedure, a History and Physical                            was performed, and patient medications and                            allergies were reviewed. The patient's tolerance of                            previous anesthesia was also reviewed. The risks                            and benefits of the procedure and the sedation                            options and risks were discussed with the patient.                            All questions were answered, and informed consent                            was obtained. Prior Anticoagulants: The patient has                            taken no previous anticoagulant or antiplatelet                            agents. ASA Grade Assessment: III - A patient with  severe systemic disease. After reviewing the risks                            and benefits, the patient was deemed in                            satisfactory condition to undergo the procedure.                           After obtaining informed consent, the endoscope was                            passed under direct vision. Throughout the                            procedure, the  patient's blood pressure, pulse, and                            oxygen saturations were monitored continuously. The                            [id number] ([id number]) scope was introduced through the                            mouth, and advanced to the second part of duodenum.                            The upper GI endoscopy was accomplished without                            difficulty. The patient tolerated the procedure. Scope In: Scope Out: Findings:      LA Grade B (one or more mucosal breaks greater than 5 mm, not extending       between the tops of two mucosal folds) esophagitis was found.      The stomach was normal.      The cardia and gastric fundus were normal on retroflexion.      The examined duodenum was normal. Impression:               - LA Grade B reflux esophagitis related to large                            volume ascites.                           - Normal stomach.                           - Normal examined duodenum.                           - No specimens collected.                           No malignancy seen in UGI tract. Moderate Sedation:      MAC  sedation used Recommendation:           - Patient has a contact number available for                            emergencies. The signs and symptoms of potential                            delayed complications were discussed with the                            patient. Return to normal activities tomorrow.                            Written discharge instructions were provided to the                            patient.                           - Resume previous diet.                           - Continue present medications.                           Return to oncology care. Procedure Code(s):        --- Professional ---                           [phone], Esophagogastroduodenoscopy, flexible,                            transoral; diagnostic, including collection of                            specimen(s) by brushing or  washing, when performed                            (separate procedure) Diagnosis Code(s):        --- Professional ---                           K21.0, Gastro-esophageal reflux disease with                            esophagitis                           R93.3, Abnormal findings on diagnostic imaging of                            other parts of digestive tract CPT copyright 2016 American Medical Association. All rights reserved. The codes documented in this report are preliminary and upon coder review may  be revised to meet current compliance requirements. [name], MD 12/29/2016 11:01:02 AM This report has been signed electronically. Number of Addenda: 0

## 2016-12-31 ENCOUNTER — Telehealth: Payer: Self-pay | Admitting: *Deleted

## 2016-12-31 NOTE — Telephone Encounter (Signed)
VM from patient reporting he has developed bruises and tenderness in both axilla. Forwarded message to collaborative nurse to discuss w/MD.

## 2016-12-31 NOTE — Telephone Encounter (Signed)
Message left for patient to call [place] back with MD instructions.

## 2016-12-31 NOTE — Telephone Encounter (Signed)
Call back received from patient and patient informed that Dr. Benay Spice was notified of his call regarding bruising and tenderness to both axilla and Dr. Benay Spice will assess him at his scheduled visit on 01/04/17.  Patient encouraged to call [place] back if any other new issues arise.

## 2017-01-01 ENCOUNTER — Encounter
Admission: RE | Admit: 2017-01-01 | Discharge: 2017-01-01 | Disposition: A | Discharge status: Home / Self Care | Payer: PRIVATE HEALTH INSURANCE | Source: Ambulatory Visit | Attending: Oncology | Admitting: Oncology

## 2017-01-01 ENCOUNTER — Telehealth: Payer: Self-pay | Admitting: *Deleted

## 2017-01-01 ENCOUNTER — Ambulatory Visit (HOSPITAL_COMMUNITY): Payer: PRIVATE HEALTH INSURANCE

## 2017-01-01 ENCOUNTER — Other Ambulatory Visit: Payer: Self-pay | Admitting: *Deleted

## 2017-01-01 ENCOUNTER — Ambulatory Visit (HOSPITAL_COMMUNITY)
Admission: RE | Admit: 2017-01-01 | Discharge: 2017-01-01 | Disposition: A | Discharge status: Home / Self Care | Payer: PRIVATE HEALTH INSURANCE | Source: Ambulatory Visit | Attending: Oncology | Admitting: Oncology

## 2017-01-01 ENCOUNTER — Encounter (HOSPITAL_COMMUNITY): Payer: Self-pay | Admitting: Gastroenterology

## 2017-01-01 DIAGNOSIS — C801 Malignant (primary) neoplasm, unspecified: Secondary | ICD-10-CM | POA: Diagnosis present

## 2017-01-01 DIAGNOSIS — C786 Secondary malignant neoplasm of retroperitoneum and peritoneum: Secondary | ICD-10-CM

## 2017-01-01 LAB — GLUCOSE, CAPILLARY: GLUCOSE-CAPILLARY: 100 mg/dL — AB (ref 65–99)

## 2017-01-01 MED ORDER — FLUDEOXYGLUCOSE F - 18 (FDG) INJECTION
12.6200 | Freq: Once | INTRAVENOUS | Status: AC | PRN
  Administered 2017-01-01: 12.62 via INTRAVENOUS

## 2017-01-01 NOTE — Procedures (Signed)
PROCEDURE SUMMARY:  Successful US guided paracentesis from RLQ.  Yielded 8L of hazy yellow fluid.  No immediate complications.  Pt tolerated well.   Specimen was not sent for labs.  Ascencion Dike PA-C 01/01/2017 4:11 PM

## 2017-01-01 NOTE — Telephone Encounter (Signed)
Pt called requesting to have more fluid drawn when he has his paracentesis today.   Dr Benay Spice says it is OK to draw 8 liters.   Pt notified, order changed

## 2017-01-04 ENCOUNTER — Telehealth: Payer: Self-pay | Admitting: Oncology

## 2017-01-04 ENCOUNTER — Ambulatory Visit (HOSPITAL_BASED_OUTPATIENT_CLINIC_OR_DEPARTMENT_OTHER): Payer: PRIVATE HEALTH INSURANCE | Admitting: Oncology

## 2017-01-04 VITALS — BP 122/76 | HR 82 | Temp 98.2°F | Resp 20 | Wt 228.2 lb

## 2017-01-04 DIAGNOSIS — B372 Candidiasis of skin and nail: Secondary | ICD-10-CM | POA: Diagnosis not present

## 2017-01-04 DIAGNOSIS — C786 Secondary malignant neoplasm of retroperitoneum and peritoneum: Secondary | ICD-10-CM | POA: Diagnosis not present

## 2017-01-04 DIAGNOSIS — C801 Malignant (primary) neoplasm, unspecified: Secondary | ICD-10-CM | POA: Diagnosis not present

## 2017-01-04 MED ORDER — NYSTATIN 100000 UNIT/GM EX POWD: Freq: Two times a day (BID) | CUTANEOUS | 1 refills | Status: DC

## 2017-01-04 MED ORDER — OXYCODONE-ACETAMINOPHEN 5-325 MG PO TABS: 1.0000 | ORAL_TABLET | ORAL | 0 refills | Status: DC | PRN

## 2017-01-04 NOTE — Telephone Encounter (Signed)
Per patient, follow up was scheduled for 01/23 as per 02/23 per 01/04/17 los. Appointments scheduled per 01/04/17 los. Patient was given a copy of the AVS report and appointment schedule, per 01/04/17 los.

## 2017-01-04 NOTE — Progress Notes (Signed)
[facility] OFF[facility] PROGRESS NOTE   Diagnosis: Carcinomatosis  INTERVAL HISTORY:   Mr. Shamburg returns as scheduled. He underwent an upper endoscopy 12/29/2016. No tumor was found. He had another palliative paracentesis on 01/01/2017. 8 L of fluid were removed. A PET scan on 01/01/2017 revealed a area of nodularity in the left or below that was not present on a chest CT 12/17/2016 and felt to be inflammatory. Extensive hypermetabolic omental caking and ascites were noted. No primary tumor site was identified.  He reports feeling better after the paracentesis 01/01/2017. He continues to have mild abdominal fullness. He had less difficulty eating after the paracentesis procedure. He has a painful rash in the axillary area bilaterally. The groin rash has improved with nystatin powder.  Objective:  Vital signs in last 24 hours:  Blood pressure 122/76, pulse 82, temperature 98.2 F (36.8 C), temperature source Oral, resp. rate 20, weight 228 lb 3.2 oz (103.5 kg), SpO2 98 %.   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 EXAM: NUCLEAR MEDICINE PET SKULL BASE TO THIGH TECHNIQUE: 12.6 mCi F-18 FDG was injected intravenously. Full-ring PET imaging was performed from the skull base to thigh after the radiotracer. CT data was obtained and used for attenuation correction and anatomic localization.  FASTING BLOOD GLUCOSE:  Value: 100 mg/dl COMPARISON:  Multiple exams, including 12/16/2016 FINDINGS: NECK No hypermetabolic lymph nodes in the neck. Incidental failure of fusion of the anterior and posterior arch of C1, well corticated. CHEST Faint nodularity posteriorly in the left upper lobe, not present on recent chest CT from 12/17/2016, maximum SUV 3.5. The largest nodule is sub solid and about 1.0 by 0.7 cm on image 88/3. Atelectasis in both lower lobes. ABDOMEN/PELVIS Extensive hypermetabolic omental caking of tumor and extensive ascites. Along the right omentum a representative region has a maximum standard uptake value of 14.1. There is hypermetabolic infiltrative process in the root of the mesentery in just above the pancreas, maximum SUV 8.7. No obvious primary site along the bowel identified. No direct parenchymal involvement of the liver or spleen. No pathologic retroperitoneal adenopathy. SKELETON No focal hypermetabolic activity to suggest skeletal metastasis. IMPRESSION: 1. Extensive hypermetabolic omental caking of tumor with extensive ascites. No obvious primary site other than the peritoneal, query primary peritoneal carcinomatosis. 2. There is new the faint but hypermetabolic nodularity in the left upper lobe which was not present just 14 days ago on the CT of the chest. Given how quickly this appeared, this is most likely inflammatory and less likely to represent such a rapid spread of malignancy, but merits surveillance. Electronically Signed   By: Van Clines M.D.   On: 01/01/2017 13:54   US Paracentesis  Result Date: 01/01/2017 INDICATION: Recurrent ascites with peritoneal carcinomatosis. Request therapeutic paracentesis of up to 8 L max. EXAM: ULTRASOUND GUIDED RIGHT LOWER QUADRANT PARACENTESIS MEDICATIONS: None. COMPLICATIONS: None immediate. PROCEDURE: Informed written consent was  obtained from the patient after a discussion of the risks, benefits and alternatives to treatment.  A timeout was performed prior to the initiation of the procedure. Initial ultrasound scanning demonstrates a large amount of ascites within the right lower abdominal quadrant. The right lower abdomen was prepped and draped in the usual sterile fashion. 1% lidocaine with epinephrine was used for local anesthesia. Following this, a Safe-T-Centesis catheter was introduced. An ultrasound image was saved for documentation purposes. The paracentesis was performed. The catheter was removed and a dressing was applied. The patient tolerated the procedure well without immediate post procedural complication. FINDINGS: A total of approximately 8 L of hazy yellow fluid was removed. IMPRESSION: Successful ultrasound-guided paracentesis yielding 8 liters of peritoneal fluid. Read by: Ascencion Dike PA-C Electronically Signed   By: Aletta Edouard M.D.   On: 01/01/2017 16:13    Medications: I have reviewed the patient's current medications.  Assessment/Plan: 1. Abdominal carcinomatosis ? Paracentesis on 12/17/2016 confirmed malignant cells consistent with carcinoma, cytokeratin and WT-1 positive ? CTs of the chest, abdomen, and pelvis with ascites and omental caking. No other evidence of metastatic disease and no primary tumor site identified ? Upper endoscopy 12/29/2016-negative for malignancy ? PET scan 01/01/2017 with extensive hypermetabolic omental caking of tumor with extensive ascites, no primary tumor site identified            2.  Yeast rash in the axilla a and groin   Disposition:  Mr. Pach appears stable. I reviewed the PET findings with him. No primary tumor site has been identified. I will refer him for a core biopsy of the omental caking for additional history reviewed and to obtain tissue for molecular testing. The differential diagnosis includes metastatic carcinoma of unknown primary or a primary peritoneal tumor. He will be referred for a palliative paracentesis and biopsy later this week. He  will return for an office visit on 02/12/2017. We prescribed nystatin powder for the yeast rash.  I explained treatment of the metastatic carcinoma will be chemotherapy. I will recommend FOLFOX versus Taxol/carboplatin based on additional pathology review.  Betsy Coder, MD  01/04/2017  5:49 PM

## 2017-01-06 ENCOUNTER — Other Ambulatory Visit: Payer: Self-pay | Admitting: Radiology

## 2017-01-07 ENCOUNTER — Ambulatory Visit (HOSPITAL_COMMUNITY)
Admission: RE | Admit: 2017-01-07 | Discharge: 2017-01-07 | Disposition: A | Discharge status: Home / Self Care | Payer: PRIVATE HEALTH INSURANCE | Source: Ambulatory Visit | Attending: Oncology | Admitting: Oncology

## 2017-01-07 ENCOUNTER — Other Ambulatory Visit: Payer: Self-pay | Admitting: Physician Assistant

## 2017-01-07 DIAGNOSIS — R918 Other nonspecific abnormal finding of lung field: Secondary | ICD-10-CM | POA: Diagnosis not present

## 2017-01-07 DIAGNOSIS — R180 Malignant ascites: Secondary | ICD-10-CM | POA: Diagnosis not present

## 2017-01-07 DIAGNOSIS — C459 Mesothelioma, unspecified: Secondary | ICD-10-CM | POA: Diagnosis not present

## 2017-01-07 DIAGNOSIS — C801 Malignant (primary) neoplasm, unspecified: Secondary | ICD-10-CM | POA: Insufficient documentation

## 2017-01-07 DIAGNOSIS — Z8249 Family history of ischemic heart disease and other diseases of the circulatory system: Secondary | ICD-10-CM | POA: Insufficient documentation

## 2017-01-07 DIAGNOSIS — C786 Secondary malignant neoplasm of retroperitoneum and peritoneum: Secondary | ICD-10-CM | POA: Diagnosis present

## 2017-01-07 DIAGNOSIS — R188 Other ascites: Secondary | ICD-10-CM | POA: Diagnosis not present

## 2017-01-07 NOTE — Procedures (Signed)
Ultrasound-guided therapeutic paracentesis performed yielding 9.5 liters of hazy, yellow colored fluid. No immediate complications.

## 2017-01-08 ENCOUNTER — Ambulatory Visit (HOSPITAL_COMMUNITY)
Admission: RE | Admit: 2017-01-08 | Discharge: 2017-01-08 | Disposition: A | Discharge status: Home / Self Care | Payer: PRIVATE HEALTH INSURANCE | Source: Ambulatory Visit | Attending: Oncology | Admitting: Oncology

## 2017-01-08 ENCOUNTER — Encounter (HOSPITAL_COMMUNITY): Payer: Self-pay

## 2017-01-08 DIAGNOSIS — C786 Secondary malignant neoplasm of retroperitoneum and peritoneum: Secondary | ICD-10-CM | POA: Diagnosis not present

## 2017-01-08 DIAGNOSIS — C451 Mesothelioma of peritoneum: Secondary | ICD-10-CM | POA: Insufficient documentation

## 2017-01-08 DIAGNOSIS — C801 Malignant (primary) neoplasm, unspecified: Principal | ICD-10-CM

## 2017-01-08 HISTORY — DX: Mesothelioma of peritoneum: C45.1

## 2017-01-08 HISTORY — DX: Malignant (primary) neoplasm, unspecified: C80.1

## 2017-01-08 HISTORY — DX: Secondary malignant neoplasm of retroperitoneum and peritoneum: C78.6

## 2017-01-08 LAB — CBC
HEMATOCRIT: 46.3 % (ref 39.0–52.0)
HEMOGLOBIN: 15.9 g/dL (ref 13.0–17.0)
MCH: 30.9 pg (ref 26.0–34.0)
MCHC: 34.3 g/dL (ref 30.0–36.0)
MCV: 89.9 fL (ref 78.0–100.0)
Platelets: 432 10*3/uL — ABNORMAL HIGH (ref 150–400)
RBC: 5.15 MIL/uL (ref 4.22–5.81)
RDW: 13.4 % (ref 11.5–15.5)
WBC: 9.5 10*3/uL (ref 4.0–10.5)

## 2017-01-08 LAB — PROTIME-INR
INR: 0.97
PROTHROMBIN TIME: 12.8 s (ref 11.4–15.2)

## 2017-01-08 LAB — APTT: APTT: 32 s (ref 24–36)

## 2017-01-08 MED ORDER — MIDAZOLAM HCL 2 MG/2ML IJ SOLN
INTRAMUSCULAR | Status: AC | PRN
  Administered 2017-01-08 (×2): 1 mg via INTRAVENOUS

## 2017-01-08 MED ORDER — LIDOCAINE HCL 1 % IJ SOLN
INTRAMUSCULAR | Status: AC
  Filled 2017-01-08: qty 20

## 2017-01-08 MED ORDER — MIDAZOLAM HCL 2 MG/2ML IJ SOLN
INTRAMUSCULAR | Status: AC
  Filled 2017-01-08: qty 2

## 2017-01-08 MED ORDER — FENTANYL CITRATE (PF) 100 MCG/2ML IJ SOLN
INTRAMUSCULAR | Status: AC
  Filled 2017-01-08: qty 2

## 2017-01-08 MED ORDER — SODIUM CHLORIDE 0.9 % IV SOLN: INTRAVENOUS | Status: DC

## 2017-01-08 MED ORDER — FENTANYL CITRATE (PF) 100 MCG/2ML IJ SOLN
INTRAMUSCULAR | Status: AC | PRN
  Administered 2017-01-08 (×2): 25 ug via INTRAVENOUS

## 2017-01-08 NOTE — Procedures (Signed)
Omental mass Bx 18 g core times 8 No comp/EBL

## 2017-01-08 NOTE — Discharge Instructions (Signed)
Needle Biopsy, Care After °Introduction °These instructions give you information about caring for yourself after your procedure. Your doctor may also give you more specific instructions. Call your doctor if you have any problems or questions after your procedure. °Follow these instructions at home: °· Rest as told by your doctor. °· Take medicines only as told by your doctor. °· There are many different ways to close and cover the biopsy site, including stitches (sutures), skin glue, and adhesive strips. Follow instructions from your doctor about: °¨ How to take care of your biopsy site. °¨ When and how you should change your bandage (dressing). °¨ When you should remove your dressing. °¨ Removing whatever was used to close your biopsy site. °· Check your biopsy site every day for signs of infection. Watch for: °¨ Redness, swelling, or pain. °¨ Fluid, blood, or pus. °Contact a doctor if: °· You have a fever. °· You have redness, swelling, or pain at the biopsy site, and it lasts longer than a few days. °· You have fluid, blood, or pus coming from the biopsy site. °· You feel sick to your stomach (nauseous). °· You throw up (vomit). °Get help right away if: °· You are short of breath. °· You have trouble breathing. °· Your chest hurts. °· You feel dizzy or you pass out (faint). °· You have bleeding that does not stop with pressure or a bandage. °· You cough up blood. °· Your belly (abdomen) hurts. °This information is not intended to replace advice given to you by your health care provider. Make sure you discuss any questions you have with your health care provider. °Document Released: 11/19/2008 Document Revised: 05/14/2016 Document Reviewed: 12/03/2014 °© 2017 [employer] ° °

## 2017-01-08 NOTE — Sedation Documentation (Signed)
Patient denies pain and is resting comfortably.  

## 2017-01-08 NOTE — H&P (Signed)
Chief Complaint: Patient was seen in consultation today for omental mass biopsy at the request of Sherrill,Gary B  Referring Physician(s): Ladell Pier  Supervising Physician: Marybelle Killings  Patient Status: [facility] - Out-pt  History of Present Illness: Frank Tucker is a 56 y.o. male   abd pain and ascites Has had 2 large volume paracentesis in last week + malignant cells; c/w carcinomatosis  Newly diagnosed peritoneal carcinomatosis Follows with Dr Benay Spice Request for omental mass biopsy  PET 01/01/17: IMPRESSION: 1. Extensive hypermetabolic omental caking of tumor with extensive ascites. No obvious primary site other than the peritoneal, query primary peritoneal carcinomatosis. 2. There is new the faint but hypermetabolic nodularity in the left upper lobe which was not present just 14 days ago on the CT of the chest. Given how quickly this appeared, this is most likely inflammatory and less likely to represent such a rapid spread of malignancy, but merits surveillance.   Past Medical History:  Diagnosis Date  . Ascites   . Peritoneal carcinomatosis [facility])     Past Surgical History:  Procedure Laterality Date  . ESOPHAGOGASTRODUODENOSCOPY (EGD) WITH PROPOFOL N/A 12/29/2016   Procedure: ESOPHAGOGASTRODUODENOSCOPY (EGD) WITH PROPOFOL;  Surgeon: Doran Stabler, MD;  Location: [facility];  Service: Gastroenterology;  Laterality: N/A;  . PARACENTESIS  12/25/2016    Allergies: Patient has no known allergies.  Medications: Prior to Admission medications   Medication Sig Start Date End Date Taking? Authorizing Provider  nystatin (MYCOSTATIN/NYSTOP) powder Apply topically 2 (two) times daily. To under arm rash 01/04/17  Yes Ladell Pier, MD  oxyCODONE-acetaminophen (PERCOCET/ROXICET) 5-325 MG tablet Take 1 tablet by mouth every 4 (four) hours as needed for severe pain. 01/04/17  Yes Ladell Pier, MD  Probiotic Product (PROBIOTIC PO) Take 1 tablet by mouth  daily.   Yes Historical Provider, MD  ibuprofen (ADVIL,MOTRIN) 200 MG tablet Take 200 mg by mouth every 6 (six) hours as needed for moderate pain.     Historical Provider, MD     Family History  Problem Relation Age of Onset  . Brain cancer Mother   . CAD Father     Social History   Social History  . Marital status: Married    Spouse name: N/A  . Number of children: N/A  . Years of education: N/A   Social History Main Topics  . Smoking status: Never Smoker  . Smokeless tobacco: Never Used  . Alcohol use No  . Drug use: No  . Sexual activity: Not Asked   Other Topics Concern  . None   Social History Narrative   Married,   Has #2 Ezequiel Essex and daughter (19/18)    Review of Systems: A 12 point ROS discussed and pertinent positives are indicated in the HPI above.  All other systems are negative.  Review of Systems  Constitutional: Positive for appetite change. Negative for fatigue.  Respiratory: Negative for shortness of breath.   Gastrointestinal: Positive for abdominal distention and abdominal pain.  Neurological: Positive for weakness.  Psychiatric/Behavioral: Negative for behavioral problems and confusion.    Vital Signs: BP 112/74   Pulse 69   Temp 98.5 F (36.9 C) (Oral)   Resp 16   Ht 5\' 10"  (1.778 m)   Wt 220 lb (99.8 kg)   SpO2 98%   BMI 31.57 kg/m   Physical Exam  Constitutional: He is oriented to person, place, and time.  Cardiovascular: Normal rate, regular rhythm and normal heart sounds.   Pulmonary/Chest:  Effort normal and breath sounds normal. He has no wheezes.  Abdominal: Soft. Bowel sounds are normal. There is no tenderness.  Musculoskeletal: Normal range of motion.  Neurological: He is alert and oriented to person, place, and time.  Skin: Skin is warm and dry.  Psychiatric: He has a normal mood and affect. His behavior is normal. Judgment and thought content normal.  Nursing note and vitals reviewed.   Mallampati Score:  MD  Evaluation Airway: WNL Heart: WNL Abdomen: WNL ASA  Classification: 3 Mallampati/Airway Score: One  Imaging: Ct Chest Wo Contrast  Result Date: 12/17/2016 CLINICAL DATA:  Ascites and omental disease.  Evaluate lungs. EXAM: CT CHEST WITHOUT CONTRAST TECHNIQUE: Multidetector CT imaging of the chest was performed following the standard protocol without IV contrast. COMPARISON:  12/16/2016 abdominal CT scan. FINDINGS: Chest wall: No chest wall mass, supraclavicular or axillary lymphadenopathy. The thyroid gland appears normal. Cardiovascular: The heart is normal in size. No pericardial effusion. The aorta is normal in caliber. Scattered coronary artery calcifications. Mediastinum/Nodes: No mediastinal or hilar mass or lymphadenopathy. The esophagus is grossly normal. Lungs/Pleura: No acute pulmonary findings. No worrisome pulmonary nodules. No pleural effusion. There is bibasilar atelectasis and mild eventration of both hemidiaphragms. Upper Abdomen: Moderate 2 large volume upper abdominal ascites. I do not see any obvious morphologic changes of cirrhosis. No splenomegaly. Diffuse omental disease as demonstrated on the prior CT scan. High attenuation anterior MM gallbladder is likely due to vicarious excretion of contrast. Musculoskeletal: No significant bony findings. IMPRESSION: 1. No mediastinal or hilar mass or adenopathy and no pulmonary lesions. 2. Bibasilar atelectasis. 3. Upper abdominal ascites and omental disease as demonstrated on the recent abdominal CT scan. Electronically Signed   By: Marijo Sanes M.D.   On: 12/17/2016 13:44   Ct Abdomen Pelvis W Contrast  Result Date: 12/16/2016 CLINICAL DATA:  Subacute onset of worsening generalized abdominal distention and pain. Initial encounter. EXAM: CT ABDOMEN AND PELVIS WITH CONTRAST TECHNIQUE: Multidetector CT imaging of the abdomen and pelvis was performed using the standard protocol following bolus administration of intravenous contrast.  CONTRAST:  127mL ISOVUE-300 IOPAMIDOL (ISOVUE-300) INJECTION 61% COMPARISON:  Abdominal radiograph performed 12/06/2016 FINDINGS: Lower chest: Mild bibasilar atelectasis is noted. The visualized portions of the mediastinum are otherwise unremarkable. Hepatobiliary: The liver is grossly unremarkable in appearance. The gallbladder is unremarkable. The common bile duct remains normal in caliber. Pancreas: The pancreas is unremarkable in appearance. Spleen: The spleen is unremarkable in appearance. Adrenals/Urinary Tract: The adrenal glands are unremarkable in appearance. The kidneys are within normal limits. There is no evidence of hydronephrosis. No renal or ureteral stones are identified. Mild nonspecific perinephric stranding is noted bilaterally. Stomach/Bowel: The stomach is unremarkable in appearance. The small bowel is grossly unremarkable, though difficult to fully assess due to surrounding fluid. The appendix is normal in caliber, without evidence of appendicitis. No definite colonic mass is seen. The colon is difficult to fully assess due to surrounding fluid. Vascular/Lymphatic: The abdominal aorta is unremarkable in appearance. The inferior vena cava is grossly unremarkable. No retroperitoneal lymphadenopathy is seen. No pelvic sidewall lymphadenopathy is identified. Reproductive: A small urachal remnant is noted. The bladder is mildly distended and otherwise unremarkable. The prostate is borderline prominent. Other: A large amount of ascites is noted within the abdomen and pelvis. There is diffuse omental caking, more dense at the right upper quadrant, compatible with peritoneal carcinomatosis. Associated recruitment of vasculature is noted. No definite primary malignancy is identified on this study, though it cannot be  excluded. Mesothelioma could have such an appearance. Musculoskeletal: No acute osseous abnormalities are identified. The visualized musculature is unremarkable in appearance. IMPRESSION:  1. Diffuse omental caking, more dense at the right upper quadrant, compatible with diffuse peritoneal carcinomatosis. No definite primary malignancy identified on the study, though it cannot be excluded. Mesothelioma could also have such an appearance. 2. Large amount of ascites throughout the abdomen and pelvis. Diagnostic paracentesis is recommended for further evaluation, to assess for the origin of the malignancy. 3. Mild bibasilar atelectasis noted. These results were called by telephone at the time of interpretation on 12/16/2016 at 6:16 pm to Dr. Theotis Burrow, who verbally acknowledged these results. Electronically Signed   By: Garald Balding M.D.   On: 12/16/2016 18:16   Nm Pet Image Initial (pi) Skull Base To Thigh  Result Date: 01/01/2017 CLINICAL DATA:  Initial treatment strategy for peritoneal carcinomatosis of unknown primary. EXAM: NUCLEAR MEDICINE PET SKULL BASE TO THIGH TECHNIQUE: 12.6 mCi F-18 FDG was injected intravenously. Full-ring PET imaging was performed from the skull base to thigh after the radiotracer. CT data was obtained and used for attenuation correction and anatomic localization. FASTING BLOOD GLUCOSE:  Value: 100 mg/dl COMPARISON:  Multiple exams, including 12/16/2016 FINDINGS: NECK No hypermetabolic lymph nodes in the neck. Incidental failure of fusion of the anterior and posterior arch of C1, well corticated. CHEST Faint nodularity posteriorly in the left upper lobe, not present on recent chest CT from 12/17/2016, maximum SUV 3.5. The largest nodule is sub solid and about 1.0 by 0.7 cm on image 88/3. Atelectasis in both lower lobes. ABDOMEN/PELVIS Extensive hypermetabolic omental caking of tumor and extensive ascites. Along the right omentum a representative region has a maximum standard uptake value of 14.1. There is hypermetabolic infiltrative process in the root of the mesentery in just above the pancreas, maximum SUV 8.7. No obvious primary site along the bowel identified.  No direct parenchymal involvement of the liver or spleen. No pathologic retroperitoneal adenopathy. SKELETON No focal hypermetabolic activity to suggest skeletal metastasis. IMPRESSION: 1. Extensive hypermetabolic omental caking of tumor with extensive ascites. No obvious primary site other than the peritoneal, query primary peritoneal carcinomatosis. 2. There is new the faint but hypermetabolic nodularity in the left upper lobe which was not present just 14 days ago on the CT of the chest. Given how quickly this appeared, this is most likely inflammatory and less likely to represent such a rapid spread of malignancy, but merits surveillance. Electronically Signed   By: Van Clines M.D.   On: 01/01/2017 13:54   US Paracentesis  Result Date: 01/07/2017 INDICATION: Peritoneal carcinomatosis with recurrent malignant ascites. Request made for therapeutic paracentesis. EXAM: ULTRASOUND GUIDED THERAPEUTIC PARACENTESIS MEDICATIONS: None. COMPLICATIONS: None immediate. PROCEDURE: Informed written consent was obtained from the patient after a discussion of the risks, benefits and alternatives to treatment. A timeout was performed prior to the initiation of the procedure. Initial ultrasound scanning demonstrates a large amount of ascites within the left mid to lower abdominal quadrant. The left mid to lower abdomen was prepped and draped in the usual sterile fashion. 1% lidocaine was used for local anesthesia. Following this, a Yueh catheter was introduced. An ultrasound image was saved for documentation purposes. The paracentesis was performed. The catheter was removed and a dressing was applied. The patient tolerated the procedure well without immediate post procedural complication. FINDINGS: A total of approximately 9.5 liters of hazy,yellow fluid was removed. IMPRESSION: Successful ultrasound-guided therapeutic paracentesis yielding 9.5 liters of peritoneal fluid. Read  by: Rowe Robert, PA-C Electronically  Signed   By: Sandi Mariscal M.D.   On: 01/07/2017 15:24   US Paracentesis  Result Date: 01/01/2017 INDICATION: Recurrent ascites with peritoneal carcinomatosis. Request therapeutic paracentesis of up to 8 L max. EXAM: ULTRASOUND GUIDED RIGHT LOWER QUADRANT PARACENTESIS MEDICATIONS: None. COMPLICATIONS: None immediate. PROCEDURE: Informed written consent was obtained from the patient after a discussion of the risks, benefits and alternatives to treatment. A timeout was performed prior to the initiation of the procedure. Initial ultrasound scanning demonstrates a large amount of ascites within the right lower abdominal quadrant. The right lower abdomen was prepped and draped in the usual sterile fashion. 1% lidocaine with epinephrine was used for local anesthesia. Following this, a Safe-T-Centesis catheter was introduced. An ultrasound image was saved for documentation purposes. The paracentesis was performed. The catheter was removed and a dressing was applied. The patient tolerated the procedure well without immediate post procedural complication. FINDINGS: A total of approximately 8 L of hazy yellow fluid was removed. IMPRESSION: Successful ultrasound-guided paracentesis yielding 8 liters of peritoneal fluid. Read by: Ascencion Dike PA-C Electronically Signed   By: Aletta Edouard M.D.   On: 01/01/2017 16:13   US Paracentesis  Result Date: 12/25/2016 INDICATION: Peritoneal carcinomatosis, recurrent malignant ascites; request made for therapeutic paracentesis. EXAM: ULTRASOUND GUIDED THERAPEUTIC PARACENTESIS MEDICATIONS: None. COMPLICATIONS: None immediate. PROCEDURE: Informed written consent was obtained from the patient after a discussion of the risks, benefits and alternatives to treatment. A timeout was performed prior to the initiation of the procedure. Initial ultrasound scanning demonstrates a large amount of ascites within the left lower abdominal quadrant. The left lower abdomen was prepped and draped in  the usual sterile fashion. 1% lidocaine was used for local anesthesia. Following this, a Yueh catheter was introduced. An ultrasound image was saved for documentation purposes. The paracentesis was performed. The catheter was removed and a dressing was applied. The patient tolerated the procedure well without immediate post procedural complication. FINDINGS: A total of approximately 6 liters of yellow fluid was removed. IMPRESSION: Successful ultrasound-guided therapeutic paracentesis yielding 6 liters of peritoneal fluid. Read by: Rowe Robert, PA-C Electronically Signed   By: Aletta Edouard M.D.   On: 12/25/2016 15:17   US Paracentesis  Result Date: 12/17/2016 INDICATION: ascites EXAM: ULTRASOUND-GUIDED PARACENTESIS COMPARISON:  None. MEDICATIONS: 10 cc 1% lidocaine COMPLICATIONS: None immediate. TECHNIQUE: Informed written consent was obtained from the patient after a discussion of the risks, benefits and alternatives to treatment. A timeout was performed prior to the initiation of the procedure. Initial ultrasound scanning demonstrates a large amount of ascites within the left lower abdominal quadrant. The left lower abdomen was prepped and draped in the usual sterile fashion. 1% lidocaine with epinephrine was used for local anesthesia. Under direct ultrasound guidance, a 19 gauge, 7-cm, Yueh catheter was introduced. An ultrasound image was saved for documentation purposed. The paracentesis was performed. The catheter was removed and a dressing was applied. The patient tolerated the procedure well without immediate post procedural complication. FINDINGS: A total of approximately 6.1 liters of yellow fluid was removed. Samples were sent to the laboratory as requested by the clinical team. IMPRESSION: Successful ultrasound-guided paracentesis yielding 6.1 liters of peritoneal fluid. Read by Lavonia Drafts [facility] Electronically Signed   By: Corrie Mckusick D.O.   On: 12/17/2016 12:44   Dg Chest Port 1  View  Result Date: 12/16/2016 CLINICAL DATA:  Dyspnea on exertion x2 weeks. EXAM: PORTABLE CHEST 1 VIEW COMPARISON:  None. FINDINGS: Low  lung volumes with bibasilar atelectasis. Heart is normal in size. No aortic aneurysm. No suspicious osseous lesions. IMPRESSION: Low lung volumes with bibasilar atelectasis. Electronically Signed   By: Ashley Royalty M.D.   On: 12/16/2016 23:18    Labs:  CBC:  Recent Labs  12/06/16 1116 12/16/16 1209 12/17/16 0459 01/08/17 0630  WBC 9.2 8.6 8.3 9.5  HGB 16.6 16.9 15.6 15.9  HCT 47.5 47.5 44.3 46.3  PLT 331 385 349 432*    COAGS:  Recent Labs  12/16/16 1919 01/08/17 0630  INR 1.01 0.97  APTT 33 32    BMP:  Recent Labs  12/06/16 1116 12/16/16 1209 12/17/16 0459 12/18/16 0557  NA 135 139 135 137  K 3.9 4.3 3.6 3.9  CL 100* 100* 99* 100*  CO2 26 27 25 29   GLUCOSE 85 80 81 95  BUN 6 9 8 7   CALCIUM 8.9 9.1 8.6* 8.3*  CREATININE 1.00 1.07 1.00 1.01  GFRNONAA >60 >60 >60 >60  GFRAA >60 >60 >60 >60    LIVER FUNCTION TESTS:  Recent Labs  12/06/16 1116 12/16/16 1209 12/17/16 0459  BILITOT 0.8 0.6 0.4  AST 38 34 35  ALT 49 38 39  ALKPHOS 60 70 61  PROT 6.2* 6.4* 5.7*  ALBUMIN 3.2* 3.2* 2.8*    TUMOR MARKERS:  Recent Labs  12/18/16 0557  AFPTM 1.5  CEA 0.4    Assessment and Plan:  Peritoneal carcinomatosis Now scheduled for omental mass biopsy per Dr Benay Spice Risks and Benefits discussed with the patient including, but not limited to bleeding, infection, damage to adjacent structures or low yield requiring additional tests. All of the patient's questions were answered, patient is agreeable to proceed. Consent signed and in chart.   Thank you for this interesting consult.  I greatly enjoyed meeting RAUNEL UEHARA and look forward to participating in their care.  A copy of this report was sent to the requesting provider on this date.  Electronically Signed: Monia Sabal A 01/08/2017, 7:25 AM   I spent a  total of  30 Minutes   in face to face in clinical consultation, greater than 50% of which was counseling/coordinating care for omental mass bx

## 2017-01-08 NOTE — Sedation Documentation (Signed)
Patient is resting comfortably. 

## 2017-01-12 ENCOUNTER — Ambulatory Visit (HOSPITAL_BASED_OUTPATIENT_CLINIC_OR_DEPARTMENT_OTHER): Payer: PRIVATE HEALTH INSURANCE | Admitting: Nurse Practitioner

## 2017-01-12 ENCOUNTER — Telehealth: Payer: Self-pay

## 2017-01-12 ENCOUNTER — Encounter: Payer: Self-pay | Admitting: Nurse Practitioner

## 2017-01-12 ENCOUNTER — Encounter: Payer: Self-pay | Admitting: *Deleted

## 2017-01-12 VITALS — BP 110/68 | HR 80 | Temp 98.1°F | Resp 18 | Ht 70.0 in | Wt 228.5 lb

## 2017-01-12 DIAGNOSIS — R180 Malignant ascites: Secondary | ICD-10-CM | POA: Diagnosis not present

## 2017-01-12 DIAGNOSIS — C451 Mesothelioma of peritoneum: Secondary | ICD-10-CM | POA: Diagnosis not present

## 2017-01-12 DIAGNOSIS — B372 Candidiasis of skin and nail: Secondary | ICD-10-CM | POA: Diagnosis not present

## 2017-01-12 MED ORDER — DEXAMETHASONE 4 MG PO TABS: ORAL_TABLET | ORAL | 0 refills | Status: DC

## 2017-01-12 MED ORDER — FOLIC ACID 1 MG PO TABS: 1.0000 mg | ORAL_TABLET | Freq: Every day | ORAL | 4 refills | Status: DC

## 2017-01-12 MED ORDER — CYANOCOBALAMIN 1000 MCG/ML IJ SOLN
1000.0000 ug | Freq: Once | INTRAMUSCULAR | Status: AC
  Administered 2017-01-12: 1000 ug via INTRAMUSCULAR

## 2017-01-12 MED ORDER — LIDOCAINE-PRILOCAINE 2.5-2.5 % EX CREA: TOPICAL_CREAM | CUTANEOUS | 0 refills | Status: DC

## 2017-01-12 MED ORDER — PROCHLORPERAZINE MALEATE 10 MG PO TABS: 10.0000 mg | ORAL_TABLET | Freq: Four times a day (QID) | ORAL | 0 refills | Status: DC | PRN

## 2017-01-12 NOTE — Telephone Encounter (Signed)
Called radiology to have disc made for pt to pick up 01/14/17, needs 12/16/16 Ct, 12/17/16 CT, and 01/01/2017 PET.

## 2017-01-12 NOTE — Progress Notes (Addendum)
[facility] OFFICE PROGRESS NOTE   Diagnosis: Primary peritoneal mesothelioma  INTERVAL HISTORY:   [name] returns as scheduled. He notes recurrent abdominal distention/discomfort. He would like to have a paracentesis later this week. He denies nausea/vomiting. Bowels are moving. Appetite is poor. Axillary skin rash has improved.  Objective:  Vital signs in last 24 hours:  Blood pressure 110/68, pulse 80, temperature 98.1 F (36.7 C), temperature source Oral, resp. rate 18, height 5\' 10"  (1.778 m), weight 228 lb 8 oz (103.6 kg), SpO2 97 %.    Resp: Lungs clear bilaterally. Cardio: Regular rate and rhythm. GI: Abdomen is distended consistent with ascites. Vascular: No leg edema. Skin: Resolving erythematous rash in the axilla bilaterally.    Lab Results:  Lab Results  Component Value Date   WBC 9.5 01/08/2017   HGB 15.9 01/08/2017   HCT 46.3 01/08/2017   MCV 89.9 01/08/2017   PLT 432 (H) 01/08/2017   NEUTROABS 6.7 12/06/2016    Imaging:  No results found.  Medications: I have reviewed the patient's current medications.  Assessment/Plan: 1. Abdominal carcinomatosis-primary peritoneal mesothelioma ? Paracentesis on 12/17/2016 confirmed malignant cells consistent with carcinoma, cytokeratin and WT-1 positive ? CTs of the chest, abdomen, and pelvis with ascites and omental caking. No other evidence of metastatic disease and no primary tumor site identified ? Upper endoscopy 12/29/2016-negative for malignancy ? PET scan 01/01/2017 with extensive hypermetabolic omental caking of tumor with extensive ascites, no primary tumor site identified ? 01/08/2017 omental mass biopsy-malignant mesothelioma            2.  Yeast rash in the axilla and groin. Improved 01/12/2017.   Disposition: Frank Tucker appears stable. Biopsy of the omental mass showed malignant mesothelioma. We reviewed the diagnosis of primary peritoneal mesothelioma with Frank Tucker and his  daughter. They understand no therapy will be curative.  Dr. Benay Spice recommends initiation of chemotherapy with Alimta/cisplatin on a 3 week schedule with 4 cycles planned prior to a restaging CT evaluation. Dr. Benay Spice contacted Dr. Johney Maine, surgery, at [facility]. Frank Tucker will see Dr. Dell Ponto on 01/15/2017 to discuss the possibility of proceeding with debulking surgery/HIPEC following chemotherapy.  We reviewed potential toxicities associated with the chemotherapy including myelosuppression, nausea, hair loss. We discussed potential toxicities associated with cisplatin including neurotoxicity, nephrotoxicity, ototoxicity, severe nausea. We also reviewed potential toxicities associated with Alimta. He is agreeable to proceed. He will attend a chemotherapy education class today. He understands the rationale for folic acid and 0000000. He will begin folic acid 1 mg daily and will receive a B-12 injection today. We are also sending prescriptions to his pharmacy for Compazine 10 mg every 6 hours as needed, EMLA cream and dexamethasone 4 mg twice daily the day before chemotherapy and the day after chemotherapy. We will obtain baseline labs on 01/14/2017. He will return to begin cycle 1 Alimta/cisplatin on 01/19/2017.  He has recurrent ascites. We are arranging for a paracentesis 01/14/2017.  We will see him in follow-up on 01/29/2017. He will contact the office in the interim with any problems.  Patient seen with Dr. Benay Spice. 40 minutes were spent face-to-face at today's visit with the majority of that time involved in counseling/coordination of care.    Ned Card ANP/GNP-BC   01/12/2017  4:19 PM  This was a shared visit with Ned Card.Frank Tucker was interviewed and examined. The omentum biopsy is consistent with primary peritoneal mesothelioma. [facility] testing is pending.  I discussed the case with the surgical oncology  service at [facility]. They will see him 1/26. The plan is  to consider HIPEC after Alimta/Cisplatin.  We reviewed the plan and potential toxicity associated with this chemotherapy.  He agrees to proceed.  Julieanne Manson, MD

## 2017-01-12 NOTE — Progress Notes (Signed)
START ON PATHWAY REGIMEN - Mesothelioma  LOS20: Cisplatin 75 mg/m2 + Pemetrexed 500 mg/m2 q21 Days x 4 Cycles   A cycle is every 21 days:     Pemetrexed (Alimta(R)) 500 mg/m2 in 100 mL NS IV over 10 minutes followed 30 minutes later by cisplatin. Manufacturer recommends not administering to patients with CrCl < 45 mL/min Dose Mod: None     Cisplatin (Platinol(R)) 75 mg/m2 in a total of 500 mL NS IV over 2 hours. **Prehydrate and consider post-hydration** Dose Mod: None Additional Orders: Patient to receive the following prior to the initiation of therapy: 1) Dexamethasone 4 mg orally twice daily x 6 doses.  First dose 24 hours before chemotherapy. 2) Folic acid >= [id number] mcg orally daily.  First dose at least 5 days prior to the first dose of pemetrexed. 3) Vitamin B12 1,000 mcg intramuscularly every 9 weeks.  First dose at least 5 days prior to the first dose of pemetrexed.  **Always confirm dose/schedule in your pharmacy ordering system**    Patient Characteristics: First Line AJCC M Category: Staged < 8th Ed. AJCC 8 Stage Grouping: Unknown Current evidence of distant metastases? Yes AJCC T Category: [place] AJCC N Category: NX Line of therapy: First Line Would you be surprised if this patient died  in the next year? I would be surprised if this patient died in the next year  Intent of Therapy: Non-Curative / Palliative Intent, Discussed with Patient

## 2017-01-13 ENCOUNTER — Telehealth: Payer: Self-pay

## 2017-01-13 ENCOUNTER — Telehealth: Payer: Self-pay | Admitting: Oncology

## 2017-01-13 NOTE — Telephone Encounter (Signed)
FAXED RECORDS TO [facility] ID [id number]

## 2017-01-13 NOTE — Telephone Encounter (Signed)
Called central scheduling to schedule paracentesis- scheduled for 01/14/17 at 10am. Called and left message for pt with date and time and to call back to confirm.

## 2017-01-14 ENCOUNTER — Ambulatory Visit (HOSPITAL_COMMUNITY)
Admission: RE | Admit: 2017-01-14 | Discharge: 2017-01-14 | Disposition: A | Discharge status: Home / Self Care | Payer: PRIVATE HEALTH INSURANCE | Source: Ambulatory Visit | Attending: Nurse Practitioner | Admitting: Nurse Practitioner

## 2017-01-14 DIAGNOSIS — R188 Other ascites: Secondary | ICD-10-CM | POA: Diagnosis present

## 2017-01-14 DIAGNOSIS — C451 Mesothelioma of peritoneum: Secondary | ICD-10-CM | POA: Diagnosis not present

## 2017-01-14 NOTE — Procedures (Signed)
PROCEDURE SUMMARY:  Successful US guided paracentesis from RLQ.  Yielded 9L of hazy, amber colored fluid.  No immediate complications.  Pt tolerated well.   Specimen was not sent for labs.  Ascencion Dike PA-C 01/14/2017 12:02 PM

## 2017-01-15 ENCOUNTER — Telehealth: Payer: Self-pay | Admitting: Oncology

## 2017-01-15 NOTE — Telephone Encounter (Signed)
Appointments complete per 1/23 los. Per infusion supervisor not able to start treatment 1/30 - infusion at capacity. Also patient scheduled for port placement 1/31. Treatment scheduled for 2/1. Left message for patient re next appointments for 1/29 and 2/1. Patient to get new schedule 1/29.

## 2017-01-17 ENCOUNTER — Other Ambulatory Visit: Payer: Self-pay | Admitting: Oncology

## 2017-01-18 ENCOUNTER — Other Ambulatory Visit: Payer: PRIVATE HEALTH INSURANCE

## 2017-01-18 ENCOUNTER — Other Ambulatory Visit: Payer: Self-pay | Admitting: *Deleted

## 2017-01-18 ENCOUNTER — Other Ambulatory Visit (HOSPITAL_BASED_OUTPATIENT_CLINIC_OR_DEPARTMENT_OTHER): Payer: PRIVATE HEALTH INSURANCE

## 2017-01-18 DIAGNOSIS — C451 Mesothelioma of peritoneum: Secondary | ICD-10-CM | POA: Diagnosis not present

## 2017-01-18 DIAGNOSIS — C801 Malignant (primary) neoplasm, unspecified: Principal | ICD-10-CM

## 2017-01-18 DIAGNOSIS — C786 Secondary malignant neoplasm of retroperitoneum and peritoneum: Secondary | ICD-10-CM

## 2017-01-18 LAB — CBC WITH DIFFERENTIAL/PLATELET
BASO%: 0.8 % (ref 0.0–2.0)
BASOS ABS: 0.1 10*3/uL (ref 0.0–0.1)
EOS%: 0.6 % (ref 0.0–7.0)
Eosinophils Absolute: 0.1 10*3/uL (ref 0.0–0.5)
HEMATOCRIT: 48.2 % (ref 38.4–49.9)
HGB: 16.6 g/dL (ref 13.0–17.1)
LYMPH#: 0.9 10*3/uL (ref 0.9–3.3)
LYMPH%: 9.8 % — AB (ref 14.0–49.0)
MCH: 31.1 pg (ref 27.2–33.4)
MCHC: 34.5 g/dL (ref 32.0–36.0)
MCV: 90.1 fL (ref 79.3–98.0)
MONO#: 1.3 10*3/uL — ABNORMAL HIGH (ref 0.1–0.9)
MONO%: 14.2 % — AB (ref 0.0–14.0)
NEUT#: 6.8 10*3/uL — ABNORMAL HIGH (ref 1.5–6.5)
NEUT%: 74.6 % (ref 39.0–75.0)
Platelets: 600 10*3/uL — ABNORMAL HIGH (ref 140–400)
RBC: 5.34 10*6/uL (ref 4.20–5.82)
RDW: 12.8 % (ref 11.0–14.6)
WBC: 9.1 10*3/uL (ref 4.0–10.3)

## 2017-01-18 LAB — COMPREHENSIVE METABOLIC PANEL
ALT: 63 U/L — ABNORMAL HIGH (ref 0–55)
AST: 44 U/L — AB (ref 5–34)
Albumin: 2.1 g/dL — ABNORMAL LOW (ref 3.5–5.0)
Alkaline Phosphatase: 82 U/L (ref 40–150)
Anion Gap: 10 mEq/L (ref 3–11)
BUN: 6.5 mg/dL — AB (ref 7.0–26.0)
CALCIUM: 9.1 mg/dL (ref 8.4–10.4)
CO2: 25 mEq/L (ref 22–29)
Chloride: 100 mEq/L (ref 98–109)
Creatinine: 0.8 mg/dL (ref 0.7–1.3)
EGFR: >90 mL/min/{1.73_m2} (ref >90)
Glucose: 94 mg/dl (ref 70–140)
Potassium: 4.7 mEq/L (ref 3.5–5.1)
Sodium: 135 mEq/L — ABNORMAL LOW (ref 136–145)
Total Bilirubin: <0.22 mg/dL (ref 0.20–1.20)
Total Protein: 5.8 g/dL — ABNORMAL LOW (ref 6.4–8.3)

## 2017-01-18 LAB — MAGNESIUM: MAGNESIUM: 2.1 mg/dL (ref 1.5–2.5)

## 2017-01-18 MED ORDER — OXYCODONE-ACETAMINOPHEN 5-325 MG PO TABS: 1.0000 | ORAL_TABLET | ORAL | 0 refills | Status: DC | PRN

## 2017-01-19 ENCOUNTER — Other Ambulatory Visit: Payer: Self-pay | Admitting: General Surgery

## 2017-01-20 ENCOUNTER — Telehealth: Payer: Self-pay | Admitting: *Deleted

## 2017-01-20 ENCOUNTER — Encounter (HOSPITAL_COMMUNITY): Payer: Self-pay

## 2017-01-20 ENCOUNTER — Other Ambulatory Visit: Payer: Self-pay | Admitting: Nurse Practitioner

## 2017-01-20 ENCOUNTER — Ambulatory Visit (HOSPITAL_COMMUNITY)
Admission: RE | Admit: 2017-01-20 | Discharge: 2017-01-20 | Disposition: A | Discharge status: Home / Self Care | Payer: PRIVATE HEALTH INSURANCE | Source: Ambulatory Visit | Attending: Nurse Practitioner | Admitting: Nurse Practitioner

## 2017-01-20 DIAGNOSIS — C451 Mesothelioma of peritoneum: Secondary | ICD-10-CM | POA: Diagnosis not present

## 2017-01-20 DIAGNOSIS — C786 Secondary malignant neoplasm of retroperitoneum and peritoneum: Secondary | ICD-10-CM | POA: Diagnosis not present

## 2017-01-20 DIAGNOSIS — R188 Other ascites: Secondary | ICD-10-CM | POA: Insufficient documentation

## 2017-01-20 HISTORY — PX: IR GENERIC HISTORICAL: IMG1180011

## 2017-01-20 LAB — CBC WITH DIFFERENTIAL/PLATELET
BASOS ABS: 0 10*3/uL (ref 0.0–0.1)
BASOS PCT: 0 %
EOS ABS: 0.1 10*3/uL (ref 0.0–0.7)
Eosinophils Relative: 1 %
HEMATOCRIT: 46.1 % (ref 39.0–52.0)
HEMOGLOBIN: 15.9 g/dL (ref 13.0–17.0)
Lymphocytes Relative: 12 %
Lymphs Abs: 1.2 10*3/uL (ref 0.7–4.0)
MCH: 30.7 pg (ref 26.0–34.0)
MCHC: 34.5 g/dL (ref 30.0–36.0)
MCV: 89 fL (ref 78.0–100.0)
MONO ABS: 1.8 10*3/uL — AB (ref 0.1–1.0)
Monocytes Relative: 18 %
NEUTROS ABS: 7 10*3/uL (ref 1.7–7.7)
Neutrophils Relative %: 69 %
Platelets: 621 10*3/uL — ABNORMAL HIGH (ref 150–400)
RBC: 5.18 MIL/uL (ref 4.22–5.81)
RDW: 12.9 % (ref 11.5–15.5)
WBC: 10 10*3/uL (ref 4.0–10.5)

## 2017-01-20 LAB — BASIC METABOLIC PANEL
ANION GAP: 8 (ref 5–15)
BUN: 10 mg/dL (ref 6–20)
CALCIUM: 8.5 mg/dL — AB (ref 8.9–10.3)
CO2: 24 mmol/L (ref 22–32)
CREATININE: 0.8 mg/dL (ref 0.61–1.24)
Chloride: 99 mmol/L — ABNORMAL LOW (ref 101–111)
Glucose, Bld: 98 mg/dL (ref 65–99)
Potassium: 4.5 mmol/L (ref 3.5–5.1)
Sodium: 131 mmol/L — ABNORMAL LOW (ref 135–145)

## 2017-01-20 LAB — PROTIME-INR
INR: 0.93
PROTHROMBIN TIME: 12.5 s (ref 11.4–15.2)

## 2017-01-20 MED ORDER — MIDAZOLAM HCL 2 MG/2ML IJ SOLN
INTRAMUSCULAR | Status: AC | PRN
  Administered 2017-01-20 (×2): 1 mg via INTRAVENOUS
  Administered 2017-01-20 (×2): 0.5 mg via INTRAVENOUS
  Administered 2017-01-20: 1 mg via INTRAVENOUS

## 2017-01-20 MED ORDER — NALOXONE HCL 0.4 MG/ML IJ SOLN
INTRAMUSCULAR | Status: DC
Start: 2017-01-20 — End: 2017-01-20
  Filled 2017-01-20: qty 1

## 2017-01-20 MED ORDER — FENTANYL CITRATE (PF) 100 MCG/2ML IJ SOLN
INTRAMUSCULAR | Status: AC
  Filled 2017-01-20: qty 6

## 2017-01-20 MED ORDER — SODIUM CHLORIDE 0.9 % IV SOLN
INTRAVENOUS | Status: DC
  Administered 2017-01-20: 13:00:00 via INTRAVENOUS

## 2017-01-20 MED ORDER — LIDOCAINE-EPINEPHRINE (PF) 2 %-1:200000 IJ SOLN
INTRAMUSCULAR | Status: AC | PRN
Start: 2017-01-20 — End: 2017-01-20
  Administered 2017-01-20: 10 mL via INTRADERMAL

## 2017-01-20 MED ORDER — MIDAZOLAM HCL 2 MG/2ML IJ SOLN
INTRAMUSCULAR | Status: AC
  Filled 2017-01-20: qty 6

## 2017-01-20 MED ORDER — LIDOCAINE-EPINEPHRINE (PF) 2 %-1:200000 IJ SOLN
INTRAMUSCULAR | Status: AC
  Filled 2017-01-20: qty 20

## 2017-01-20 MED ORDER — CEFAZOLIN SODIUM-DEXTROSE 2-4 GM/100ML-% IV SOLN
2.0000 g | INTRAVENOUS | Status: AC
  Administered 2017-01-20: 2 g via INTRAVENOUS
  Filled 2017-01-20: qty 100

## 2017-01-20 MED ORDER — FENTANYL CITRATE (PF) 100 MCG/2ML IJ SOLN
INTRAMUSCULAR | Status: AC | PRN
  Administered 2017-01-20: 50 ug via INTRAVENOUS
  Administered 2017-01-20: 25 ug via INTRAVENOUS
  Administered 2017-01-20 (×2): 50 ug via INTRAVENOUS
  Administered 2017-01-20: 25 ug via INTRAVENOUS

## 2017-01-20 MED ORDER — HEPARIN SOD (PORK) LOCK FLUSH 100 UNIT/ML IV SOLN
INTRAVENOUS | Status: AC
  Filled 2017-01-20: qty 5

## 2017-01-20 MED ORDER — FLUMAZENIL 0.5 MG/5ML IV SOLN
INTRAVENOUS | Status: AC
  Filled 2017-01-20: qty 5

## 2017-01-20 NOTE — Discharge Instructions (Signed)
Implanted Port Insertion, Care After °Refer to this sheet in the next few weeks. These instructions provide you with information on caring for yourself after your procedure. Your health care provider may also give you more specific instructions. Your treatment has been planned according to current medical practices, but problems sometimes occur. Call your health care provider if you have any problems or questions after your procedure. °WHAT TO EXPECT AFTER THE PROCEDURE °After your procedure, it is typical to have the following:  °· Discomfort at the port insertion site. Ice packs to the area will help. °· Bruising on the skin over the port. This will subside in 3-4 days. °HOME CARE INSTRUCTIONS °· After your port is placed, you will get a manufacturer's information card. The card has information about your port. Keep this card with you at all times.   °· Know what kind of port you have. There are many types of ports available.   °· Wear a medical alert bracelet in case of an emergency. This can help alert health care workers that you have a port.   °· The port can stay in for as long as your health care provider believes it is necessary.   °· A home health care nurse may give medicines and take care of the port.   °· You or a family member can get special training and directions for giving medicine and taking care of the port at home.   °SEEK MEDICAL CARE IF:  °· Your port does not flush or you are unable to get a blood return.   °· You have a fever or chills. °SEEK IMMEDIATE MEDICAL CARE IF: °· You have new fluid or pus coming from your incision.   °· You notice a bad smell coming from your incision site.   °· You have swelling, pain, or more redness at the incision or port site.   °· You have chest pain or shortness of breath. °This information is not intended to replace advice given to you by your health care provider. Make sure you discuss any questions you have with your health care provider. °Document  Released: 09/27/2013 Document Revised: 12/12/2013 Document Reviewed: 09/27/2013 °Elsevier Interactive Patient Education © 2017 [employer] ° °Moderate Conscious Sedation, Adult, Care After °These instructions provide you with information about caring for yourself after your procedure. Your health care provider may also give you more specific instructions. Your treatment has been planned according to current medical practices, but problems sometimes occur. Call your health care provider if you have any problems or questions after your procedure. °What can I expect after the procedure? °After your procedure, it is common: °· To feel sleepy for several hours. °· To feel clumsy and have poor balance for several hours. °· To have poor judgment for several hours. °· To vomit if you eat too soon. °Follow these instructions at home: °For at least 24 hours after the procedure:  °· Do not: °¨ Participate in activities where you could fall or become injured. °¨ Drive. °¨ Use heavy machinery. °¨ Drink alcohol. °¨ Take sleeping pills or medicines that cause drowsiness. °¨ Make important decisions or sign legal documents. °¨ Take care of children on your own. °· Rest. °Eating and drinking °· Follow the diet recommended by your health care provider. °· If you vomit: °¨ Drink water, juice, or soup when you can drink without vomiting. °¨ Make sure you have little or no nausea before eating solid foods. °General instructions °· Have a responsible adult stay with you until you are awake and alert. °·   Take over-the-counter and prescription medicines only as told by your health care provider. °· If you smoke, do not smoke without supervision. °· Keep all follow-up visits as told by your health care provider. This is important. °Contact a health care provider if: °· You keep feeling nauseous or you keep vomiting. °· You feel light-headed. °· You develop a rash. °· You have a fever. °Get help right away if: °· You have trouble  breathing. °This information is not intended to replace advice given to you by your health care provider. Make sure you discuss any questions you have with your health care provider. °Document Released: 09/27/2013 Document Revised: 05/11/2016 Document Reviewed: 03/28/2016 °Elsevier Interactive Patient Education © 2017 [employer] ° °

## 2017-01-20 NOTE — H&P (Signed)
Referring Physician(s): Sherrill,B  Supervising Physician: Sandi Mariscal  Patient Status:  [facility]  Chief Complaint:  "I'm having a port a cath put in"  Subjective:  Patient familiar to IR service from prior omental mass biopsy on 01/08/17 as well as multiple paracenteses. He has a history of recently diagnosed primary peritoneal malignant mesothelioma. He presents again today for Port-A-Cath placement for planned chemotherapy. He currently denies fever, headache, chest pain, dyspnea, cough, back pain, nausea, vomiting or abnormal bleeding. He does have abdominal distention and discomfort. Past Medical History:  Diagnosis Date  . Ascites   . Mesothelioma of peritoneum ([place]) 01/08/2017  . Peritoneal carcinomatosis [facility])    Past Surgical History:  Procedure Laterality Date  . ESOPHAGOGASTRODUODENOSCOPY (EGD) WITH PROPOFOL N/A 12/29/2016   Procedure: ESOPHAGOGASTRODUODENOSCOPY (EGD) WITH PROPOFOL;  Surgeon: Doran Stabler, MD;  Location: [facility] END[facility]COPY;  Service: Gastroenterology;  Laterality: N/A;  . PARACENTESIS  12/25/2016     Allergies: Patient has no known allergies.  Medications: Prior to Admission medications   Medication Sig Start Date End Date Taking? Authorizing Provider  folic acid (FOLVITE) 1 MG tablet Take 1 tablet (1 mg total) by mouth daily. 01/12/17  Yes Owens Shark, NP  ibuprofen (ADVIL,MOTRIN) 200 MG tablet Take 200 mg by mouth every 6 (six) hours as needed for moderate pain.    Yes Historical Provider, MD  nystatin (MYCOSTATIN/NYSTOP) powder Apply topically 2 (two) times daily. To under arm rash 01/04/17  Yes Ladell Pier, MD  oxyCODONE-acetaminophen (PERCOCET/ROXICET) 5-325 MG tablet Take 1 tablet by mouth every 4 (four) hours as needed for severe pain. 01/18/17  Yes Ladell Pier, MD  Probiotic Product (PROBIOTIC PO) Take 1 tablet by mouth daily.   Yes Historical Provider, MD  dexamethasone (DECADRON) 4 MG tablet Take 4 mg twice daily the day before  chemotherapy and twice daily the day after chemotherapy. 01/12/17   Owens Shark, NP  lidocaine-prilocaine (EMLA) cream Apply to portacath site 1 hour prior to use. 01/12/17   Owens Shark, NP  prochlorperazine (COMPAZINE) 10 MG tablet Take 1 tablet (10 mg total) by mouth every 6 (six) hours as needed for nausea or vomiting. 01/12/17   Owens Shark, NP     Vital Signs: BP 122/79   Pulse 85   Temp 97.7 F (36.5 C) (Oral)   Resp 20   Ht 5\' 10"  (1.778 m)   Wt 216 lb (98 kg)   SpO2 99%   BMI 30.99 kg/m   Physical Exam  awake, alert. Chest with diminished breath sounds right base, left clear. Heart with regular rate and rhythm. Abdomen distended, few bowel sounds, mild generalized tenderness to palpation. Left lower extremity with trace edema, no significant right lower extremity edema. Imaging: No results found.  Labs:  CBC:  Recent Labs  12/17/16 0459 01/08/17 0630 01/18/17 0939 01/20/17 1252  WBC 8.3 9.5 9.1 10.0  HGB 15.6 15.9 16.6 15.9  HCT 44.3 46.3 48.2 46.1  PLT 349 432* 600* 621*    COAGS:  Recent Labs  12/16/16 1919 01/08/17 0630 01/20/17 1252  INR 1.01 0.97 0.93  APTT 33 32  --     BMP:  Recent Labs  12/16/16 1209 12/17/16 0459 12/18/16 0557 01/18/17 0939 01/20/17 1252  NA 139 135 137 135* 131*  K 4.3 3.6 3.9 4.7 4.5  CL 100* 99* 100*  --  99*  CO2 27 25 29 25 24   GLUCOSE 80 81 95 94 98  BUN 9 8 7  6.5* 10  CALCIUM 9.1 8.6* 8.3* 9.1 8.5*  CREATININE 1.07 1.00 1.01 0.8 0.80  GFRNONAA >60 >60 >60  --  >60  GFRAA >60 >60 >60  --  >60    LIVER FUNCTION TESTS:  Recent Labs  12/06/16 1116 12/16/16 1209 12/17/16 0459 01/18/17 0939  BILITOT 0.8 0.6 0.4 <0.22  AST 38 34 35 44*  ALT 49 38 39 63*  ALKPHOS 60 70 61 82  PROT 6.2* 6.4* 5.7* 5.8*  ALBUMIN 3.2* 3.2* 2.8* 2.1*    Assessment and Plan:  Pt with history of recently diagnosed primary peritoneal malignant mesothelioma. He presents  today for Port-A-Cath placement for planned  chemotherapy.Risks and benefits discussed with the patient including, but not limited to bleeding, infection, pneumothorax, or fibrin sheath development and need for additional procedures.All of the patient's questions were answered, patient is agreeable to proceed.Consent signed and in chart.     Electronically Signed: D. Rowe Robert 01/20/2017, 1:38 PM   I spent a total of 20 minutes at the the patient's bedside AND on the patient's hospital floor or unit, greater than 50% of which was counseling/coordinating care for Port-A-Cath placement

## 2017-01-20 NOTE — Procedures (Signed)
Successful placement of right IJ approach port-a-cath with tip at the superior caval atrial junction. The catheter is ready for immediate use. Estimated Blood Loss: Minimal No immediate post procedural complications.  Jay Pecolia Marando, MD Pager #: [phone]   

## 2017-01-20 NOTE — Telephone Encounter (Signed)
Message received from patient returning call from this AM.  Patient instructed to arrive at radiology on Friday, 01/22/17 at 0945 for paracentesis at 1000.  Patient appreciative of information and has no questions at this time.

## 2017-01-20 NOTE — Telephone Encounter (Signed)
Message left for pt to call [facility] back with instructions for paracentesis on Friday, 01/22/17. Patient is to check in at 0945 in radiology and paracentesis is scheduled for 1000.

## 2017-01-21 ENCOUNTER — Encounter: Payer: Self-pay | Admitting: *Deleted

## 2017-01-21 ENCOUNTER — Ambulatory Visit: Payer: PRIVATE HEALTH INSURANCE | Admitting: Nutrition

## 2017-01-21 ENCOUNTER — Ambulatory Visit (HOSPITAL_BASED_OUTPATIENT_CLINIC_OR_DEPARTMENT_OTHER): Payer: PRIVATE HEALTH INSURANCE

## 2017-01-21 VITALS — BP 120/78 | HR 98 | Temp 98.3°F | Resp 16

## 2017-01-21 DIAGNOSIS — Z5111 Encounter for antineoplastic chemotherapy: Secondary | ICD-10-CM

## 2017-01-21 DIAGNOSIS — C451 Mesothelioma of peritoneum: Secondary | ICD-10-CM

## 2017-01-21 MED ORDER — SODIUM CHLORIDE 0.9% FLUSH
10.0000 mL | INTRAVENOUS | Status: DC | PRN
  Administered 2017-01-21: 10 mL
  Filled 2017-01-21: qty 10

## 2017-01-21 MED ORDER — POTASSIUM CHLORIDE 2 MEQ/ML IV SOLN
Freq: Once | INTRAVENOUS | Status: AC
  Administered 2017-01-21: 09:00:00 via INTRAVENOUS
  Filled 2017-01-21: qty 10

## 2017-01-21 MED ORDER — SODIUM CHLORIDE 0.9 % IV SOLN
Freq: Once | INTRAVENOUS | Status: AC
  Administered 2017-01-21: 11:00:00 via INTRAVENOUS
  Filled 2017-01-21: qty 5

## 2017-01-21 MED ORDER — HEPARIN SOD (PORK) LOCK FLUSH 100 UNIT/ML IV SOLN
500.0000 [IU] | Freq: Once | INTRAVENOUS | Status: AC | PRN
  Administered 2017-01-21: 500 [IU]
  Filled 2017-01-21: qty 5

## 2017-01-21 MED ORDER — PALONOSETRON HCL INJECTION 0.25 MG/5ML
0.2500 mg | Freq: Once | INTRAVENOUS | Status: AC
  Administered 2017-01-21: 0.25 mg via INTRAVENOUS

## 2017-01-21 MED ORDER — SODIUM CHLORIDE 0.9 % IV SOLN
75.0000 mg/m2 | Freq: Once | INTRAVENOUS | Status: AC
  Administered 2017-01-21: 170 mg via INTRAVENOUS
  Filled 2017-01-21: qty 170

## 2017-01-21 MED ORDER — PALONOSETRON HCL INJECTION 0.25 MG/5ML
INTRAVENOUS | Status: AC
  Filled 2017-01-21: qty 5

## 2017-01-21 MED ORDER — SODIUM CHLORIDE 0.9 % IV SOLN
Freq: Once | INTRAVENOUS | Status: AC
  Administered 2017-01-21: 11:00:00 via INTRAVENOUS

## 2017-01-21 MED ORDER — PEMETREXED DISODIUM CHEMO INJECTION 500 MG
490.0000 mg/m2 | Freq: Once | INTRAVENOUS | Status: AC
  Administered 2017-01-21: 1100 mg via INTRAVENOUS
  Filled 2017-01-21: qty 40

## 2017-01-21 NOTE — Progress Notes (Signed)
Nutrition follow-up completed with patient during chemotherapy for metastatic peritoneal carcinomatosis. Weight was documented as 216 pounds on January 31 decreased from 242 pounds. Patient recently had 9 L fluid removed with paracentesis. Patient reports he is scheduled to have more fluid removed tomorrow. He continues to have early satiety and is trying to eat more often. Reports he tolerates and enjoys ensure Enlive. Complains of constipation; has been taking Senokot-S.  Nutrition diagnosis:  Food and nutrition related knowledge deficit continues.  Intervention: Educated patient to try to increase fluids around the time of chemotherapy. Recommended patient continue oral nutrition supplements such as Ensure Plus or ensure Enlive. Provided coupons and samples. Provided one complementary case of Ensure Plus. Reviewed strategies for improving constipation. Reviewed importance of small frequent meals and snacks. Questions were answered.  Teach back method used.  Monitoring, evaluation, goals: Patient will tolerate increased calories and protein to preserve lean body mass.  Next visit: Thursday, February 22, during infusion.  **Disclaimer: This note was dictated with voice recognition software. Similar sounding words can inadvertently be transcribed and this note may contain transcription errors which may not have been corrected upon publication of note.**

## 2017-01-21 NOTE — Progress Notes (Signed)
Patient tolerated treatment well. Discharge instructions reviewed and patient verbalized understanding. Patient stable upon discharge.

## 2017-01-21 NOTE — Patient Instructions (Addendum)
[place] Discharge Instructions for Patients Receiving Chemotherapy  Today you received the following chemotherapy agents: Pemetrexed and Cisplatin   To help prevent nausea and vomiting after your treatment, we encourage you to take your nausea medication as directed.    If you develop nausea and vomiting that is not controlled by your nausea medication, call the clinic.   BELOW ARE SYMPTOMS THAT SHOULD BE REPORTED IMMEDIATELY:  *FEVER GREATER THAN 100.5 F  *CHILLS WITH OR WITHOUT FEVER  NAUSEA AND VOMITING THAT IS NOT CONTROLLED WITH YOUR NAUSEA MEDICATION  *UNUSUAL SHORTNESS OF BREATH  *UNUSUAL BRUISING OR BLEEDING  TENDERNESS IN MOUTH AND THROAT WITH OR WITHOUT PRESENCE OF ULCERS  *URINARY PROBLEMS  *BOWEL PROBLEMS  UNUSUAL RASH Items with * indicate a potential emergency and should be followed up as soon as possible.  Feel free to call the clinic you have any questions or concerns. The clinic phone number is [phone].  Please show the [place] at check-in to the Emergency Department and triage nurse.  Pemetrexed injection What is this medicine? PEMETREXED (PEM e TREX ed) is a chemotherapy drug. This medicine affects cells that are rapidly growing, such as cancer cells and cells in your mouth and stomach. It is usually used to treat lung cancers like non-small cell lung cancer and mesothelioma. It may also be used to treat other cancers. This medicine may be used for other purposes; ask your health care provider or pharmacist if you have questions. COMMON BRAND NAME(S): Alimta What should I tell my health care provider before I take this medicine? They need to know if you have any of these conditions: -if you frequently drink alcohol containing beverages -infection (especially a virus infection such as chickenpox, cold sores, or herpes) -kidney disease -liver disease -low blood counts, like low platelets, red bloods, or white blood  cells -an unusual or allergic reaction to pemetrexed, mannitol, other medicines, foods, dyes, or preservatives -pregnant or trying to get pregnant -breast-feeding How should I use this medicine? This drug is given as an infusion into a vein. It is administered in a hospital or clinic by a specially trained health care professional. Talk to your pediatrician regarding the use of this medicine in children. Special care may be needed. Overdosage: If you think you have taken too much of this medicine contact a poison control center or emergency room at once. NOTE: This medicine is only for you. Do not share this medicine with others. What if I miss a dose? It is important not to miss your dose. Call your doctor or health care professional if you are unable to keep an appointment. What may interact with this medicine? -aspirin and aspirin-like medicines -medicines to increase blood counts like filgrastim, pegfilgrastim, sargramostim -methotrexate -NSAIDS, medicines for pain and inflammation, like ibuprofen or naproxen -probenecid -pyrimethamine -vaccines Talk to your doctor or health care professional before taking any of these medicines: -acetaminophen -aspirin -ibuprofen -ketoprofen -naproxen This list may not describe all possible interactions. Give your health care provider a list of all the medicines, herbs, non-prescription drugs, or dietary supplements you use. Also tell them if you smoke, drink alcohol, or use illegal drugs. Some items may interact with your medicine. What should I watch for while using this medicine? Visit your doctor for checks on your progress. This drug may make you feel generally unwell. This is not uncommon, as chemotherapy can affect healthy cells as well as cancer cells. Report any side effects. Continue your course  of treatment even though you feel ill unless your doctor tells you to stop. In some cases, you may be given additional medicines to help with side  effects. Follow all directions for their use. Call your doctor or health care professional for advice if you get a fever, chills or sore throat, or other symptoms of a cold or flu. Do not treat yourself. This drug decreases your body's ability to fight infections. Try to avoid being around people who are sick. This medicine may increase your risk to bruise or bleed. Call your doctor or health care professional if you notice any unusual bleeding. Be careful brushing and flossing your teeth or using a toothpick because you may get an infection or bleed more easily. If you have any dental work done, tell your dentist you are receiving this medicine. Avoid taking products that contain aspirin, acetaminophen, ibuprofen, naproxen, or ketoprofen unless instructed by your doctor. These medicines may hide a fever. Call your doctor or health care professional if you get diarrhea or mouth sores. Do not treat yourself. To protect your kidneys, drink water or other fluids as directed while you are taking this medicine. Men and women must use effective birth control while taking this medicine. You may also need to continue using effective birth control for a time after stopping this medicine. Do not become pregnant while taking this medicine. Tell your doctor right away if you think that you or your partner might be pregnant. There is a potential for serious side effects to an unborn child. Talk to your health care professional or pharmacist for more information. Do not breast-feed an infant while taking this medicine. This medicine may lower sperm counts. What side effects may I notice from receiving this medicine? Side effects that you should report to your doctor or health care professional as soon as possible: -allergic reactions like skin rash, itching or hives, swelling of the face, lips, or tongue -low blood counts - this medicine may decrease the number of white blood cells, red blood cells and platelets. You  may be at increased risk for infections and bleeding. -signs of infection - fever or chills, cough, sore throat, pain or difficulty passing urine -signs of decreased platelets or bleeding - bruising, pinpoint red spots on the skin, black, tarry stools, blood in the urine -signs of decreased red blood cells - unusually weak or tired, fainting spells, lightheadedness -breathing problems, like a dry cough -changes in emotions or moods -chest pain -confusion -diarrhea -high blood pressure -mouth or throat sores or ulcers -pain, swelling, warmth in the leg -pain on swallowing -swelling of the ankles, feet, hands -trouble passing urine or change in the amount of urine -vomiting -yellowing of the eyes or skin Side effects that usually do not require medical attention (report to your doctor or health care professional if they continue or are bothersome): -hair loss -loss of appetite -nausea -stomach upset This list may not describe all possible side effects. Call your doctor for medical advice about side effects. You may report side effects to FDA at 1-800-FDA-1088. Where should I keep my medicine? This drug is given in a hospital or clinic and will not be stored at home. NOTE: This sheet is a summary. It may not cover all possible information. If you have questions about this medicine, talk to your doctor, pharmacist, or health care provider.  2017 Elsevier/Gold Standard (2008-07-10 13:24:03)  Cisplatin injection What is this medicine? CISPLATIN (SIS pla tin) is a chemotherapy drug. It targets  fast dividing cells, like cancer cells, and causes these cells to die. This medicine is used to treat many types of cancer like bladder, ovarian, and testicular cancers. This medicine may be used for other purposes; ask your health care provider or pharmacist if you have questions. COMMON BRAND NAME(S): Platinol, Platinol -AQ What should I tell my health care provider before I take this  medicine? They need to know if you have any of these conditions: -blood disorders -hearing problems -kidney disease -recent or ongoing radiation therapy -an unusual or allergic reaction to cisplatin, carboplatin, other chemotherapy, other medicines, foods, dyes, or preservatives -pregnant or trying to get pregnant -breast-feeding How should I use this medicine? This drug is given as an infusion into a vein. It is administered in a hospital or clinic by a specially trained health care professional. Talk to your pediatrician regarding the use of this medicine in children. Special care may be needed. Overdosage: If you think you have taken too much of this medicine contact a poison control center or emergency room at once. NOTE: This medicine is only for you. Do not share this medicine with others. What if I miss a dose? It is important not to miss a dose. Call your doctor or health care professional if you are unable to keep an appointment. What may interact with this medicine? -dofetilide -foscarnet -medicines for seizures -medicines to increase blood counts like filgrastim, pegfilgrastim, sargramostim -probenecid -pyridoxine used with altretamine -rituximab -some antibiotics like amikacin, gentamicin, neomycin, polymyxin B, streptomycin, tobramycin -sulfinpyrazone -vaccines -zalcitabine Talk to your doctor or health care professional before taking any of these medicines: -acetaminophen -aspirin -ibuprofen -ketoprofen -naproxen This list may not describe all possible interactions. Give your health care provider a list of all the medicines, herbs, non-prescription drugs, or dietary supplements you use. Also tell them if you smoke, drink alcohol, or use illegal drugs. Some items may interact with your medicine. What should I watch for while using this medicine? Your condition will be monitored carefully while you are receiving this medicine. You will need important blood work done  while you are taking this medicine. This drug may make you feel generally unwell. This is not uncommon, as chemotherapy can affect healthy cells as well as cancer cells. Report any side effects. Continue your course of treatment even though you feel ill unless your doctor tells you to stop. In some cases, you may be given additional medicines to help with side effects. Follow all directions for their use. Call your doctor or health care professional for advice if you get a fever, chills or sore throat, or other symptoms of a cold or flu. Do not treat yourself. This drug decreases your body's ability to fight infections. Try to avoid being around people who are sick. This medicine may increase your risk to bruise or bleed. Call your doctor or health care professional if you notice any unusual bleeding. Be careful brushing and flossing your teeth or using a toothpick because you may get an infection or bleed more easily. If you have any dental work done, tell your dentist you are receiving this medicine. Avoid taking products that contain aspirin, acetaminophen, ibuprofen, naproxen, or ketoprofen unless instructed by your doctor. These medicines may hide a fever. Do not become pregnant while taking this medicine. Women should inform their doctor if they wish to become pregnant or think they might be pregnant. There is a potential for serious side effects to an unborn child. Talk to your health care professional  or pharmacist for more information. Do not breast-feed an infant while taking this medicine. Drink fluids as directed while you are taking this medicine. This will help protect your kidneys. Call your doctor or health care professional if you get diarrhea. Do not treat yourself. What side effects may I notice from receiving this medicine? Side effects that you should report to your doctor or health care professional as soon as possible: -allergic reactions like skin rash, itching or hives, swelling  of the face, lips, or tongue -signs of infection - fever or chills, cough, sore throat, pain or difficulty passing urine -signs of decreased platelets or bleeding - bruising, pinpoint red spots on the skin, black, tarry stools, nosebleeds -signs of decreased red blood cells - unusually weak or tired, fainting spells, lightheadedness -breathing problems -changes in hearing -gout pain -low blood counts - This drug may decrease the number of white blood cells, red blood cells and platelets. You may be at increased risk for infections and bleeding. -nausea and vomiting -pain, swelling, redness or irritation at the injection site -pain, tingling, numbness in the hands or feet -problems with balance, movement -trouble passing urine or change in the amount of urine Side effects that usually do not require medical attention (report to your doctor or health care professional if they continue or are bothersome): -changes in vision -loss of appetite -metallic taste in the mouth or changes in taste This list may not describe all possible side effects. Call your doctor for medical advice about side effects. You may report side effects to FDA at 1-800-FDA-1088. Where should I keep my medicine? This drug is given in a hospital or clinic and will not be stored at home. NOTE: This sheet is a summary. It may not cover all possible information. If you have questions about this medicine, talk to your doctor, pharmacist, or health care provider.  2017 Elsevier/Gold Standard (2008-03-13 14:40:54)

## 2017-01-22 ENCOUNTER — Ambulatory Visit (HOSPITAL_COMMUNITY)
Admission: RE | Admit: 2017-01-22 | Discharge: 2017-01-22 | Disposition: A | Discharge status: Home / Self Care | Payer: PRIVATE HEALTH INSURANCE | Source: Ambulatory Visit | Attending: Oncology | Admitting: Oncology

## 2017-01-22 DIAGNOSIS — C786 Secondary malignant neoplasm of retroperitoneum and peritoneum: Secondary | ICD-10-CM | POA: Insufficient documentation

## 2017-01-22 DIAGNOSIS — C801 Malignant (primary) neoplasm, unspecified: Secondary | ICD-10-CM | POA: Insufficient documentation

## 2017-01-22 DIAGNOSIS — R180 Malignant ascites: Secondary | ICD-10-CM | POA: Insufficient documentation

## 2017-01-22 NOTE — Progress Notes (Signed)
[place] Work  Clinical Social Work was referred by need for CSW follow up for assessment of psychosocial needs.  Clinical Social Worker met with patient at [facility] during treatment to offer support and re-assess for needs.  Pt reports he is doing ok and eager to complete HCPOA/ADRs. CSW provided pt with packet and will meet with pt to complete at his appt next Friday. CSW to follow and assist.   Clinical Social Work interventions:  ADRs education  Loren Racer, [place] Worker [place]  [place] Phone: [phone] Fax: [phone]

## 2017-01-22 NOTE — Procedures (Signed)
PROCEDURE SUMMARY:  Successful US guided paracentesis from right lateral abdomen.  Yielded 6 liters of clear yellow fluid.  No immediate complications.  Pt tolerated well.   Amani Marseille S Silas Muff PA-C 01/22/2017 1:18 PM

## 2017-01-29 ENCOUNTER — Ambulatory Visit (HOSPITAL_BASED_OUTPATIENT_CLINIC_OR_DEPARTMENT_OTHER): Payer: PRIVATE HEALTH INSURANCE | Admitting: Nurse Practitioner

## 2017-01-29 ENCOUNTER — Other Ambulatory Visit (HOSPITAL_BASED_OUTPATIENT_CLINIC_OR_DEPARTMENT_OTHER): Payer: PRIVATE HEALTH INSURANCE

## 2017-01-29 ENCOUNTER — Ambulatory Visit (HOSPITAL_BASED_OUTPATIENT_CLINIC_OR_DEPARTMENT_OTHER): Payer: PRIVATE HEALTH INSURANCE

## 2017-01-29 ENCOUNTER — Telehealth: Payer: Self-pay

## 2017-01-29 ENCOUNTER — Encounter: Payer: Self-pay | Admitting: *Deleted

## 2017-01-29 VITALS — BP 132/76 | HR 70 | Temp 97.7°F | Resp 18 | Wt 222.6 lb

## 2017-01-29 DIAGNOSIS — C786 Secondary malignant neoplasm of retroperitoneum and peritoneum: Secondary | ICD-10-CM

## 2017-01-29 DIAGNOSIS — R110 Nausea: Secondary | ICD-10-CM | POA: Diagnosis not present

## 2017-01-29 DIAGNOSIS — Z95828 Presence of other vascular implants and grafts: Secondary | ICD-10-CM

## 2017-01-29 DIAGNOSIS — C451 Mesothelioma of peritoneum: Secondary | ICD-10-CM

## 2017-01-29 DIAGNOSIS — R180 Malignant ascites: Secondary | ICD-10-CM

## 2017-01-29 DIAGNOSIS — C801 Malignant (primary) neoplasm, unspecified: Secondary | ICD-10-CM

## 2017-01-29 DIAGNOSIS — R1030 Lower abdominal pain, unspecified: Secondary | ICD-10-CM

## 2017-01-29 LAB — CBC WITH DIFFERENTIAL/PLATELET
BASO%: 0.3 % (ref 0.0–2.0)
Basophils Absolute: 0 10*3/uL (ref 0.0–0.1)
EOS ABS: 0 10*3/uL (ref 0.0–0.5)
EOS%: 0.9 % (ref 0.0–7.0)
HEMATOCRIT: 42.6 % (ref 38.4–49.9)
HGB: 14.3 g/dL (ref 13.0–17.1)
LYMPH%: 22.9 % (ref 14.0–49.0)
MCH: 29.5 pg (ref 27.2–33.4)
MCHC: 33.6 g/dL (ref 32.0–36.0)
MCV: 87.8 fL (ref 79.3–98.0)
MONO#: 0.2 10*3/uL (ref 0.1–0.9)
MONO%: 5.9 % (ref 0.0–14.0)
NEUT%: 70 % (ref 39.0–75.0)
NEUTROS ABS: 2.3 10*3/uL (ref 1.5–6.5)
PLATELETS: 199 10*3/uL (ref 140–400)
RBC: 4.85 10*6/uL (ref 4.20–5.82)
RDW: 12.5 % (ref 11.0–14.6)
WBC: 3.2 10*3/uL — AB (ref 4.0–10.3)
lymph#: 0.7 10*3/uL — ABNORMAL LOW (ref 0.9–3.3)

## 2017-01-29 LAB — COMPREHENSIVE METABOLIC PANEL
ALK PHOS: 65 U/L (ref 40–150)
ALT: 15 U/L (ref 0–55)
ANION GAP: 9 meq/L (ref 3–11)
AST: 19 U/L (ref 5–34)
Albumin: 2.2 g/dL — ABNORMAL LOW (ref 3.5–5.0)
BILIRUBIN TOTAL: 0.3 mg/dL (ref 0.20–1.20)
BUN: 11.7 mg/dL (ref 7.0–26.0)
CALCIUM: 8.6 mg/dL (ref 8.4–10.4)
CO2: 29 mEq/L (ref 22–29)
CREATININE: 0.7 mg/dL (ref 0.7–1.3)
Chloride: 96 mEq/L — ABNORMAL LOW (ref 98–109)
Glucose: 79 mg/dl (ref 70–140)
Potassium: 3.6 mEq/L (ref 3.5–5.1)
Sodium: 134 mEq/L — ABNORMAL LOW (ref 136–145)
TOTAL PROTEIN: 5.2 g/dL — AB (ref 6.4–8.3)

## 2017-01-29 LAB — MAGNESIUM: MAGNESIUM: 1.5 mg/dL (ref 1.5–2.5)

## 2017-01-29 MED ORDER — OXYCODONE-ACETAMINOPHEN 5-325 MG PO TABS: 1.0000 | ORAL_TABLET | ORAL | 0 refills | Status: DC | PRN

## 2017-01-29 MED ORDER — SODIUM CHLORIDE 0.9% FLUSH
10.0000 mL | INTRAVENOUS | Status: DC | PRN
  Administered 2017-01-29: 10 mL via INTRAVENOUS
  Filled 2017-01-29: qty 10

## 2017-01-29 MED ORDER — ONDANSETRON HCL 8 MG PO TABS: 8.0000 mg | ORAL_TABLET | Freq: Three times a day (TID) | ORAL | 2 refills | Status: DC | PRN

## 2017-01-29 MED ORDER — HEPARIN SOD (PORK) LOCK FLUSH 100 UNIT/ML IV SOLN
500.0000 [IU] | Freq: Once | INTRAVENOUS | Status: AC | PRN
  Administered 2017-01-29: 500 [IU] via INTRAVENOUS
  Filled 2017-01-29: qty 5

## 2017-01-29 MED ORDER — PROCHLORPERAZINE MALEATE 10 MG PO TABS: 10.0000 mg | ORAL_TABLET | Freq: Four times a day (QID) | ORAL | 3 refills | Status: DC | PRN

## 2017-01-29 NOTE — Telephone Encounter (Signed)
Left message with patient informing him of appt date and time for paracentesis, they did not have any availability until Thursday the 15th at 10am. Pt to call back with any questions or concerns.

## 2017-01-29 NOTE — Progress Notes (Signed)
[place] Social Work  Clinical Social Work was referred by pt  to review and complete healthcare advance directives.  Clinical Social Worker met with patient and his friend, Jenny Reichmann in [place] office.  The patient designated Orvin Netter as their primary healthcare agent and Charlsie Merles, Brooke Bonito as their secondary agent.  Patient already has an existing healthcare living will, so this was not completed.    Clinical Social Worker notarized documents and made copies for patient/family. Clinical Social Worker will send documents to medical records to be scanned into patient's chart. Clinical Social Worker encouraged patient/family to contact with any additional questions or concerns.  Loren Racer, LCSW, OSW-C Clinical Social Worker [place]  [place] Phone: [phone] Fax: [phone]

## 2017-01-29 NOTE — Progress Notes (Addendum)
  [place] OFFICE PROGRESS NOTE   Diagnosis:  Primary peritoneal mesothelioma  INTERVAL HISTORY:   Frank Tucker returns as scheduled. He completed cycle 1 cisplatin/Alimta 01/21/2017. He underwent a paracentesis on 01/22/2017 with 6 L of fluid removed.  He developed nausea day 2. The nausea worsened on day 3 and has persisted. He is taking Compazine with partial relief. On day 3 he developed alternating constipation/diarrhea. This has resolved. He does not feel the ascites has reaccumulated as quickly as in the past. He would like to have a paracentesis early next week. He continues to have low abdominal pain. He takes oxycodone as needed.  Objective:  Vital signs in last 24 hours:  Blood pressure 132/76, pulse 70, temperature 97.7 F (36.5 C), temperature source Oral, resp. rate 18, weight 222 lb 9.6 oz (101 kg), SpO2 98 %.    HEENT: No thrush or ulcers. Mucous membranes are moist. Resp: Lungs clear bilaterally. Cardio: Regular rate and rhythm. GI: Abdomen is soft, distended consistent with ascites. No organomegaly. Vascular: Trace bilateral pretibial/ankle edema. The cath without erythema.  Lab Results:  Lab Results  Component Value Date   WBC 3.2 (L) 01/29/2017   HGB 14.3 01/29/2017   HCT 42.6 01/29/2017   MCV 87.8 01/29/2017   PLT 199 01/29/2017   NEUTROABS 2.3 01/29/2017    Imaging:  No results found.  Medications: I have reviewed the patient's current medications.  Assessment/Plan: 1. Abdominal carcinomatosis-primary peritoneal mesothelioma ? Paracentesis on 12/17/2016 confirmed malignant cells consistent with carcinoma, cytokeratin and WT-1 positive ? CTs of the chest, abdomen, and pelvis with ascites and omental caking. No other evidence of metastatic disease and no primary tumor site identified ? Upper endoscopy 12/29/2016-negative for malignancy ? PET scan 01/01/2017 with extensive hypermetabolic omental caking of tumor with extensive  ascites, no primary tumor site identified ? 01/08/2017 omental mass biopsy-malignant mesothelioma ? Cycle 1 cisplatin/Alimta 01/21/2017   2. Yeast rash in the axilla and groin. Improved 01/12/2017.             3.   Port-A-Cath placement 01/20/2017             4.   Delayed nausea related to chemotherapy.             5.   Malignant ascites   Disposition: Frank Tucker appears stable. He has completed 1 cycle of cisplatin/Alimta. He is experiencing delayed nausea. He will take dexamethasone 4 mg twice a day for the next 2 days. We sent a prescription to his pharmacy for Zofran 8 mg every 8 hours as needed. He will contact the office if this is not effective.  We are arranging for a therapeutic paracentesis 02/01/2017.  He will return for a follow-up visit and cycle 2 cisplatin/Alimta on 02/11/2017. He will contact the office in the interim as outlined above or with any other problems.  Patient seen with Dr. Benay Spice. 25 minutes were spent face-to-face at today's visit with the majority of that time involved in counseling/coordination of care.    Ned Card ANP/GNP-BC   01/29/2017  10:34 AM   This was a shared visit with Ned Card.  We adjusted the antiemetic regimen.  The plan is to complete 4 cycles of chemotherapy prior to a restaging evaluation.  Julieanne Manson, MD

## 2017-02-04 ENCOUNTER — Ambulatory Visit (HOSPITAL_COMMUNITY)
Admission: RE | Admit: 2017-02-04 | Discharge: 2017-02-04 | Disposition: A | Discharge status: Home / Self Care | Payer: PRIVATE HEALTH INSURANCE | Source: Ambulatory Visit | Attending: Nurse Practitioner | Admitting: Nurse Practitioner

## 2017-02-04 DIAGNOSIS — C451 Mesothelioma of peritoneum: Secondary | ICD-10-CM | POA: Diagnosis present

## 2017-02-04 DIAGNOSIS — R180 Malignant ascites: Secondary | ICD-10-CM | POA: Insufficient documentation

## 2017-02-04 NOTE — Procedures (Signed)
Ultrasound-guided therapeutic paracentesis performed yielding 8 liters of cloudy yellow gelatinous type fluid. No immediate complications.  Frank Tucker E 12:33 PM 02/04/2017

## 2017-02-07 ENCOUNTER — Other Ambulatory Visit: Payer: Self-pay | Admitting: Oncology

## 2017-02-10 ENCOUNTER — Other Ambulatory Visit: Payer: Self-pay | Admitting: Nurse Practitioner

## 2017-02-10 DIAGNOSIS — C451 Mesothelioma of peritoneum: Secondary | ICD-10-CM

## 2017-02-11 ENCOUNTER — Ambulatory Visit (HOSPITAL_BASED_OUTPATIENT_CLINIC_OR_DEPARTMENT_OTHER): Payer: PRIVATE HEALTH INSURANCE

## 2017-02-11 ENCOUNTER — Ambulatory Visit (HOSPITAL_BASED_OUTPATIENT_CLINIC_OR_DEPARTMENT_OTHER): Payer: PRIVATE HEALTH INSURANCE | Admitting: Oncology

## 2017-02-11 ENCOUNTER — Ambulatory Visit: Payer: PRIVATE HEALTH INSURANCE | Admitting: Nutrition

## 2017-02-11 ENCOUNTER — Ambulatory Visit: Payer: PRIVATE HEALTH INSURANCE

## 2017-02-11 ENCOUNTER — Other Ambulatory Visit (HOSPITAL_BASED_OUTPATIENT_CLINIC_OR_DEPARTMENT_OTHER): Payer: PRIVATE HEALTH INSURANCE

## 2017-02-11 VITALS — BP 122/76 | HR 56 | Temp 98.2°F | Resp 18 | Ht 70.0 in | Wt 229.2 lb

## 2017-02-11 DIAGNOSIS — B372 Candidiasis of skin and nail: Secondary | ICD-10-CM | POA: Diagnosis not present

## 2017-02-11 DIAGNOSIS — Z5111 Encounter for antineoplastic chemotherapy: Secondary | ICD-10-CM

## 2017-02-11 DIAGNOSIS — C451 Mesothelioma of peritoneum: Secondary | ICD-10-CM

## 2017-02-11 DIAGNOSIS — R180 Malignant ascites: Secondary | ICD-10-CM | POA: Diagnosis not present

## 2017-02-11 DIAGNOSIS — Z95828 Presence of other vascular implants and grafts: Secondary | ICD-10-CM

## 2017-02-11 DIAGNOSIS — R600 Localized edema: Secondary | ICD-10-CM

## 2017-02-11 DIAGNOSIS — C801 Malignant (primary) neoplasm, unspecified: Secondary | ICD-10-CM

## 2017-02-11 DIAGNOSIS — C786 Secondary malignant neoplasm of retroperitoneum and peritoneum: Secondary | ICD-10-CM

## 2017-02-11 LAB — CBC WITH DIFFERENTIAL/PLATELET
BASO%: 0.1 % (ref 0.0–2.0)
Basophils Absolute: 0 10*3/uL (ref 0.0–0.1)
EOS ABS: 0 10*3/uL (ref 0.0–0.5)
EOS%: 0 % (ref 0.0–7.0)
HCT: 39.3 % (ref 38.4–49.9)
HGB: 13.3 g/dL (ref 13.0–17.1)
LYMPH#: 1.4 10*3/uL (ref 0.9–3.3)
LYMPH%: 16 % (ref 14.0–49.0)
MCH: 30.1 pg (ref 27.2–33.4)
MCHC: 33.8 g/dL (ref 32.0–36.0)
MCV: 88.9 fL (ref 79.3–98.0)
MONO#: 1.2 10*3/uL — ABNORMAL HIGH (ref 0.1–0.9)
MONO%: 13.2 % (ref 0.0–14.0)
NEUT%: 70.7 % (ref 39.0–75.0)
NEUTROS ABS: 6.3 10*3/uL (ref 1.5–6.5)
Platelets: 437 10*3/uL — ABNORMAL HIGH (ref 140–400)
RBC: 4.42 10*6/uL (ref 4.20–5.82)
RDW: 13.6 % (ref 11.0–14.6)
WBC: 8.9 10*3/uL (ref 4.0–10.3)

## 2017-02-11 LAB — COMPREHENSIVE METABOLIC PANEL
ALT: 12 U/L (ref 0–55)
AST: 13 U/L (ref 5–34)
Albumin: 2.2 g/dL — ABNORMAL LOW (ref 3.5–5.0)
Alkaline Phosphatase: 72 U/L (ref 40–150)
Anion Gap: 10 mEq/L (ref 3–11)
BUN: 8.2 mg/dL (ref 7.0–26.0)
CO2: 26 meq/L (ref 22–29)
CREATININE: 0.7 mg/dL (ref 0.7–1.3)
Calcium: 8.8 mg/dL (ref 8.4–10.4)
Chloride: 101 mEq/L (ref 98–109)
EGFR: >90 mL/min/{1.73_m2} (ref >90)
GLUCOSE: 82 mg/dL (ref 70–140)
Potassium: 4.2 mEq/L (ref 3.5–5.1)
SODIUM: 137 meq/L (ref 136–145)
TOTAL PROTEIN: 5.5 g/dL — AB (ref 6.4–8.3)
Total Bilirubin: <0.22 mg/dL (ref 0.20–1.20)

## 2017-02-11 LAB — MAGNESIUM: MAGNESIUM: 1.5 mg/dL (ref 1.5–2.5)

## 2017-02-11 LAB — TECHNOLOGIST REVIEW

## 2017-02-11 MED ORDER — SODIUM CHLORIDE 0.9 % IV SOLN
485.0000 mg/m2 | Freq: Once | INTRAVENOUS | Status: AC
  Administered 2017-02-11: 1100 mg via INTRAVENOUS
  Filled 2017-02-11: qty 40

## 2017-02-11 MED ORDER — OXYCODONE-ACETAMINOPHEN 5-325 MG PO TABS: 1.0000 | ORAL_TABLET | ORAL | 0 refills | Status: DC | PRN

## 2017-02-11 MED ORDER — PALONOSETRON HCL INJECTION 0.25 MG/5ML
INTRAVENOUS | Status: AC
  Filled 2017-02-11: qty 5

## 2017-02-11 MED ORDER — SODIUM CHLORIDE 0.9 % IV SOLN
Freq: Once | INTRAVENOUS | Status: AC
  Administered 2017-02-11: 12:00:00 via INTRAVENOUS
  Filled 2017-02-11: qty 5

## 2017-02-11 MED ORDER — SODIUM CHLORIDE 0.9% FLUSH
10.0000 mL | INTRAVENOUS | Status: DC | PRN
  Administered 2017-02-11: 10 mL
  Filled 2017-02-11: qty 10

## 2017-02-11 MED ORDER — POTASSIUM CHLORIDE 2 MEQ/ML IV SOLN
Freq: Once | INTRAVENOUS | Status: AC
  Administered 2017-02-11: 10:00:00 via INTRAVENOUS
  Filled 2017-02-11: qty 10

## 2017-02-11 MED ORDER — SODIUM CHLORIDE 0.9 % IV SOLN
75.0000 mg/m2 | Freq: Once | INTRAVENOUS | Status: AC
  Administered 2017-02-11: 170 mg via INTRAVENOUS
  Filled 2017-02-11: qty 170

## 2017-02-11 MED ORDER — SODIUM CHLORIDE 0.9% FLUSH
10.0000 mL | INTRAVENOUS | Status: DC | PRN
  Administered 2017-02-11: 10 mL via INTRAVENOUS
  Filled 2017-02-11: qty 10

## 2017-02-11 MED ORDER — PALONOSETRON HCL INJECTION 0.25 MG/5ML
0.2500 mg | Freq: Once | INTRAVENOUS | Status: AC
  Administered 2017-02-11: 0.25 mg via INTRAVENOUS

## 2017-02-11 MED ORDER — HEPARIN SOD (PORK) LOCK FLUSH 100 UNIT/ML IV SOLN
500.0000 [IU] | Freq: Once | INTRAVENOUS | Status: AC | PRN
  Administered 2017-02-11: 500 [IU]
  Filled 2017-02-11: qty 5

## 2017-02-11 NOTE — Progress Notes (Signed)
Nutrition follow-up completed with patient during chemotherapy for metastatic peritoneal carcinomatosis. Weight improved documented as 229.2 pounds February 22. Patient does have lower leg edema. Reports he feels well and is eating normally. He is still consuming Ensure Plus.  Nutrition diagnosis: Food and nutrition related knowledge deficit improved.  Intervention: Educated patient to continue increased fluids. He should also continue oral nutrition supplements one to 2 daily. Questions were answered.  Teach back method used.  Monitoring, evaluation, goals: Patient will tolerate increased calories and protein to preserve lean body mass.  Next visit: To be scheduled as needed.  **Disclaimer: This note was dictated with voice recognition software. Similar sounding words can inadvertently be transcribed and this note may contain transcription errors which may not have been corrected upon publication of note.**

## 2017-02-11 NOTE — Patient Instructions (Signed)
[facility] Discharge Instructions for Patients Receiving Chemotherapy  Today you received the following chemotherapy agents: Alimta and Cisplatin.   To help prevent nausea and vomiting after your treatment, we encourage you to take your nausea medication as directed.    If you develop nausea and vomiting that is not controlled by your nausea medication, call the clinic.   BELOW ARE SYMPTOMS THAT SHOULD BE REPORTED IMMEDIATELY:  *FEVER GREATER THAN 100.5 F  *CHILLS WITH OR WITHOUT FEVER  NAUSEA AND VOMITING THAT IS NOT CONTROLLED WITH YOUR NAUSEA MEDICATION  *UNUSUAL SHORTNESS OF BREATH  *UNUSUAL BRUISING OR BLEEDING  TENDERNESS IN MOUTH AND THROAT WITH OR WITHOUT PRESENCE OF ULCERS  *URINARY PROBLEMS  *BOWEL PROBLEMS  UNUSUAL RASH Items with * indicate a potential emergency and should be followed up as soon as possible.  Feel free to call the clinic you have any questions or concerns. The clinic phone number is [phone].  Please show the CHEMO ALERT CARD at check-in to the Emergency Department and triage nurse.   

## 2017-02-11 NOTE — Progress Notes (Signed)
  [place] OFFICE PROGRESS NOTE   Diagnosis: Peritoneal mesothelioma  INTERVAL HISTORY:   [name] returns as scheduled. The nausea resolved after he took Decadron. He has noted a decrease in the abdominal distention. He last underwent a paracentesis on 02/04/2017. He does not feel in need of a paracentesis today. He reports increased edema at the feet and lower legs.  He has a poor appetite, but he is eating.  Objective:  Vital signs in last 24 hours:  Blood pressure 122/76, pulse (!) 56, temperature 98.2 F (36.8 C), temperature source Oral, resp. rate 18, height 5\' 10"  (1.778 m), weight 229 lb 3.2 oz (104 kg), SpO2 99 %.    HEENT: No thrush or ulcers Resp: Lungs clear bilaterally Cardio: Regular rate and rhythm GI: Mildly distended, no mass, nontender, no hepatosplenomegaly Vascular: 1+ pitting edema at the lower leg and foot bilaterally   Portacath/PICC-without erythema  Lab Results:  Lab Results  Component Value Date   WBC 3.2 (L) 01/29/2017   HGB 14.3 01/29/2017   HCT 42.6 01/29/2017   MCV 87.8 01/29/2017   PLT 199 01/29/2017   NEUTROABS 2.3 01/29/2017     Medications: I have reviewed the patient's current medications.  Assessment/Plan: 1. Abdominal carcinomatosis-primary peritoneal mesothelioma ? Paracentesis on 12/17/2016 confirmed malignant cells consistent with carcinoma, cytokeratin and WT-1 positive ? CTs of the chest, abdomen, and pelvis with ascites and omental caking. No other evidence of metastatic disease and no primary tumor site identified ? Upper endoscopy 12/29/2016-negative for malignancy ? PET scan 01/01/2017 with extensive hypermetabolic omental caking of tumor with extensive ascites, no primary tumor site identified ? 01/08/2017 omental mass biopsy-malignant mesothelioma ? Cycle 1 cisplatin/Alimta 01/21/2017 ? Cycle 2 cisplatin/Alimta 02/11/2017   2. Yeast rash in the axilla and groin. Improved  01/12/2017.             3.   Port-A-Cath placement 01/20/2017             4.   Delayed nausea secondary to chemotherapy-improved with Decadron             5.   Malignant ascites    Disposition:  Frank Tucker completed one cycle of cisplatin/Alimta. The paracentesis were quite has diminished. He will be scheduled for another paracentesis on 02/17/2017.  The plan is to proceed with cycle 2 cisplatin/Alimta today. He will take Decadron prophylaxis following chemotherapy. He will use Zofran and Compazine as needed. Frank Tucker will contact us for nausea following this cycle of chemotherapy.   He will return for an office visit and chemotherapy in 3 weeks.  The leg edema secondary to Decadron and the abdominal pressure. He will elevate the legs and try support stockings.  25 minutes were spent with the patient today. The majority of the time was used for counseling and coordination of care.  Betsy Coder, MD  02/11/2017  8:31 AM

## 2017-02-12 ENCOUNTER — Ambulatory Visit: Payer: PRIVATE HEALTH INSURANCE | Admitting: Nurse Practitioner

## 2017-02-16 ENCOUNTER — Telehealth: Payer: Self-pay | Admitting: *Deleted

## 2017-02-16 MED ORDER — LORAZEPAM 1 MG PO TABS: 1.0000 mg | ORAL_TABLET | Freq: Four times a day (QID) | ORAL | 0 refills | Status: DC | PRN

## 2017-02-16 NOTE — Telephone Encounter (Signed)
Pt's call reviewed with Dr. Benay Spice: Take Decadron 4 mg BID for 2 additional days. Returned call to pt with instructions, he reports he has vomited "a lot" since last night. Last episode around 0800 today. Last BM today. Pt reports he is "managing" bowels; mild constipation. Pt has been "sipping ginger ale." He will call with update if nausea doesn't resolve by 3PM.

## 2017-02-16 NOTE — Telephone Encounter (Signed)
Returned call to pt, Dr. Benay Spice recommends Lorazepam Q6 hours PRN nausea. Script called to pharmacy. Pt will call office if unable to tolerate POs.

## 2017-02-16 NOTE — Telephone Encounter (Signed)
Patient called stating that he is having worsening nausea that is not being relieved with zofran/compazine (alternating every 4 hours). Patient has not tried ativan to help relieve. Can I send this prescription in for him.

## 2017-02-17 ENCOUNTER — Ambulatory Visit (HOSPITAL_COMMUNITY)
Admission: RE | Admit: 2017-02-17 | Discharge: 2017-02-17 | Disposition: A | Discharge status: Home / Self Care | Payer: PRIVATE HEALTH INSURANCE | Source: Ambulatory Visit | Attending: Oncology | Admitting: Oncology

## 2017-02-17 DIAGNOSIS — C451 Mesothelioma of peritoneum: Secondary | ICD-10-CM

## 2017-02-17 DIAGNOSIS — R188 Other ascites: Secondary | ICD-10-CM | POA: Diagnosis present

## 2017-02-17 NOTE — Procedures (Signed)
Ultrasound-guided therapeutic paracentesis performed yielding 9.3  liters of gelatinous, yellow  fluid. No immediate complications.

## 2017-02-18 ENCOUNTER — Encounter: Payer: Self-pay | Admitting: *Deleted

## 2017-02-18 NOTE — Progress Notes (Signed)
QI encounter 

## 2017-02-24 ENCOUNTER — Telehealth: Payer: Self-pay | Admitting: *Deleted

## 2017-02-24 ENCOUNTER — Other Ambulatory Visit: Payer: Self-pay | Admitting: *Deleted

## 2017-02-24 DIAGNOSIS — C786 Secondary malignant neoplasm of retroperitoneum and peritoneum: Secondary | ICD-10-CM

## 2017-02-24 DIAGNOSIS — C801 Malignant (primary) neoplasm, unspecified: Principal | ICD-10-CM

## 2017-02-24 DIAGNOSIS — R180 Malignant ascites: Secondary | ICD-10-CM

## 2017-02-24 NOTE — Telephone Encounter (Signed)
Call received from patient requesting paracentesis on Wednesday, 03/03/17.  Patient informed that scheduling would call him to set up paracentesis once approved by Dr. Benay Spice.  Dr. Benay Spice notified and OK for paracentesis on 03/03/17 per Dr. Benay Spice.

## 2017-02-24 NOTE — Telephone Encounter (Signed)
Call received from patient this AM requesting paracentesis next Wednesday, 03/03/17.  Patient informed that scheduling would call him with appt time once paracentesis approved by Dr. Benay Spice.  OK for paracentesis on 03/03/17 per order of Dr. Benay Spice.

## 2017-02-24 NOTE — Telephone Encounter (Signed)
"  I need to speak with the doctor's nurse to set up a paracentesis."  Call transferred [phone].

## 2017-02-28 ENCOUNTER — Other Ambulatory Visit: Payer: Self-pay | Admitting: Oncology

## 2017-03-03 ENCOUNTER — Ambulatory Visit (HOSPITAL_COMMUNITY)
Admission: RE | Admit: 2017-03-03 | Discharge: 2017-03-03 | Disposition: A | Discharge status: Home / Self Care | Payer: PRIVATE HEALTH INSURANCE | Source: Ambulatory Visit | Attending: Oncology | Admitting: Oncology

## 2017-03-03 DIAGNOSIS — R180 Malignant ascites: Secondary | ICD-10-CM | POA: Diagnosis present

## 2017-03-03 DIAGNOSIS — C801 Malignant (primary) neoplasm, unspecified: Secondary | ICD-10-CM | POA: Diagnosis present

## 2017-03-03 DIAGNOSIS — C786 Secondary malignant neoplasm of retroperitoneum and peritoneum: Secondary | ICD-10-CM | POA: Diagnosis present

## 2017-03-03 NOTE — Procedures (Signed)
PROCEDURE SUMMARY:  Successful US guided paracentesis from RLQ.  Yielded 5 L of clear yellow fluid.  No immediate complications.  Pt tolerated well.   Specimen was not sent for labs.  Ascencion Dike PA-C 03/03/2017 1:24 PM

## 2017-03-04 ENCOUNTER — Telehealth: Payer: Self-pay | Admitting: Oncology

## 2017-03-04 ENCOUNTER — Ambulatory Visit: Payer: PRIVATE HEALTH INSURANCE

## 2017-03-04 ENCOUNTER — Ambulatory Visit: Payer: PRIVATE HEALTH INSURANCE | Admitting: Nutrition

## 2017-03-04 ENCOUNTER — Other Ambulatory Visit (HOSPITAL_BASED_OUTPATIENT_CLINIC_OR_DEPARTMENT_OTHER): Payer: PRIVATE HEALTH INSURANCE

## 2017-03-04 ENCOUNTER — Ambulatory Visit (HOSPITAL_BASED_OUTPATIENT_CLINIC_OR_DEPARTMENT_OTHER): Payer: PRIVATE HEALTH INSURANCE

## 2017-03-04 ENCOUNTER — Ambulatory Visit (HOSPITAL_BASED_OUTPATIENT_CLINIC_OR_DEPARTMENT_OTHER): Payer: PRIVATE HEALTH INSURANCE | Admitting: Oncology

## 2017-03-04 VITALS — BP 119/76 | HR 63 | Temp 97.7°F | Resp 18 | Ht 70.0 in | Wt 213.4 lb

## 2017-03-04 DIAGNOSIS — R180 Malignant ascites: Secondary | ICD-10-CM

## 2017-03-04 DIAGNOSIS — Z5111 Encounter for antineoplastic chemotherapy: Secondary | ICD-10-CM

## 2017-03-04 DIAGNOSIS — R110 Nausea: Secondary | ICD-10-CM

## 2017-03-04 DIAGNOSIS — C451 Mesothelioma of peritoneum: Secondary | ICD-10-CM

## 2017-03-04 DIAGNOSIS — Z95828 Presence of other vascular implants and grafts: Secondary | ICD-10-CM

## 2017-03-04 DIAGNOSIS — R21 Rash and other nonspecific skin eruption: Secondary | ICD-10-CM

## 2017-03-04 DIAGNOSIS — C786 Secondary malignant neoplasm of retroperitoneum and peritoneum: Secondary | ICD-10-CM

## 2017-03-04 DIAGNOSIS — C801 Malignant (primary) neoplasm, unspecified: Secondary | ICD-10-CM

## 2017-03-04 LAB — COMPREHENSIVE METABOLIC PANEL
ALBUMIN: 2.3 g/dL — AB (ref 3.5–5.0)
ALK PHOS: 74 U/L (ref 40–150)
ALT: 10 U/L (ref 0–55)
AST: 11 U/L (ref 5–34)
Anion Gap: 10 mEq/L (ref 3–11)
BUN: 10.8 mg/dL (ref 7.0–26.0)
CO2: 26 mEq/L (ref 22–29)
Calcium: 8.9 mg/dL (ref 8.4–10.4)
Chloride: 99 mEq/L (ref 98–109)
Creatinine: 0.7 mg/dL (ref 0.7–1.3)
EGFR: >90 mL/min/{1.73_m2} (ref >90)
GLUCOSE: 104 mg/dL (ref 70–140)
POTASSIUM: 4.3 meq/L (ref 3.5–5.1)
SODIUM: 135 meq/L — AB (ref 136–145)
Total Bilirubin: <0.22 mg/dL (ref 0.20–1.20)
Total Protein: 6 g/dL — ABNORMAL LOW (ref 6.4–8.3)

## 2017-03-04 LAB — CBC WITH DIFFERENTIAL/PLATELET
BASO%: 0.1 % (ref 0.0–2.0)
BASOS ABS: 0 10*3/uL (ref 0.0–0.1)
EOS%: 0 % (ref 0.0–7.0)
Eosinophils Absolute: 0 10*3/uL (ref 0.0–0.5)
HCT: 33.4 % — ABNORMAL LOW (ref 38.4–49.9)
HGB: 11.7 g/dL — ABNORMAL LOW (ref 13.0–17.1)
LYMPH%: 15.2 % (ref 14.0–49.0)
MCH: 30.2 pg (ref 27.2–33.4)
MCHC: 34.9 g/dL (ref 32.0–36.0)
MCV: 86.4 fL (ref 79.3–98.0)
MONO#: 1 10*3/uL — ABNORMAL HIGH (ref 0.1–0.9)
MONO%: 17.4 % — AB (ref 0.0–14.0)
NEUT#: 3.9 10*3/uL (ref 1.5–6.5)
NEUT%: 67.3 % (ref 39.0–75.0)
Platelets: 656 10*3/uL — ABNORMAL HIGH (ref 140–400)
RBC: 3.87 10*6/uL — ABNORMAL LOW (ref 4.20–5.82)
RDW: 14.2 % (ref 11.0–14.6)
WBC: 5.7 10*3/uL (ref 4.0–10.3)
lymph#: 0.9 10*3/uL (ref 0.9–3.3)

## 2017-03-04 LAB — TECHNOLOGIST REVIEW

## 2017-03-04 LAB — MAGNESIUM: Magnesium: 1.4 mg/dl — CL (ref 1.5–2.5)

## 2017-03-04 MED ORDER — POTASSIUM CHLORIDE 2 MEQ/ML IV SOLN
Freq: Once | INTRAVENOUS | Status: AC
  Administered 2017-03-04: 10:00:00 via INTRAVENOUS
  Filled 2017-03-04: qty 10

## 2017-03-04 MED ORDER — SODIUM CHLORIDE 0.9 % IV SOLN
75.0000 mg/m2 | Freq: Once | INTRAVENOUS | Status: AC
  Administered 2017-03-04: 170 mg via INTRAVENOUS
  Filled 2017-03-04: qty 170

## 2017-03-04 MED ORDER — CYANOCOBALAMIN 1000 MCG/ML IJ SOLN
1000.0000 ug | Freq: Once | INTRAMUSCULAR | Status: AC
  Administered 2017-03-04: 1000 ug via INTRAMUSCULAR

## 2017-03-04 MED ORDER — PALONOSETRON HCL INJECTION 0.25 MG/5ML
INTRAVENOUS | Status: AC
  Filled 2017-03-04: qty 5

## 2017-03-04 MED ORDER — HEPARIN SOD (PORK) LOCK FLUSH 100 UNIT/ML IV SOLN
500.0000 [IU] | Freq: Once | INTRAVENOUS | Status: AC | PRN
  Administered 2017-03-04: 500 [IU]
  Filled 2017-03-04: qty 5

## 2017-03-04 MED ORDER — SODIUM CHLORIDE 0.9% FLUSH
10.0000 mL | INTRAVENOUS | Status: DC | PRN
  Administered 2017-03-04: 10 mL via INTRAVENOUS
  Filled 2017-03-04: qty 10

## 2017-03-04 MED ORDER — OXYCODONE-ACETAMINOPHEN 5-325 MG PO TABS: 1.0000 | ORAL_TABLET | ORAL | 0 refills | Status: DC | PRN

## 2017-03-04 MED ORDER — PALONOSETRON HCL INJECTION 0.25 MG/5ML
0.2500 mg | Freq: Once | INTRAVENOUS | Status: AC
  Administered 2017-03-04: 0.25 mg via INTRAVENOUS

## 2017-03-04 MED ORDER — CYANOCOBALAMIN 1000 MCG/ML IJ SOLN
INTRAMUSCULAR | Status: AC
  Filled 2017-03-04: qty 1

## 2017-03-04 MED ORDER — SODIUM CHLORIDE 0.9 % IV SOLN
Freq: Once | INTRAVENOUS | Status: AC
  Administered 2017-03-04: 12:00:00 via INTRAVENOUS
  Filled 2017-03-04: qty 5

## 2017-03-04 MED ORDER — SODIUM CHLORIDE 0.9 % IV SOLN
490.0000 mg/m2 | Freq: Once | INTRAVENOUS | Status: AC
  Administered 2017-03-04: 1100 mg via INTRAVENOUS
  Filled 2017-03-04: qty 40

## 2017-03-04 MED ORDER — METOCLOPRAMIDE HCL 10 MG PO TABS: 10.0000 mg | ORAL_TABLET | Freq: Three times a day (TID) | ORAL | 0 refills | Status: DC

## 2017-03-04 MED ORDER — NYSTATIN 100000 UNIT/GM EX POWD: Freq: Two times a day (BID) | CUTANEOUS | 1 refills | Status: AC

## 2017-03-04 MED ORDER — SODIUM CHLORIDE 0.9% FLUSH
10.0000 mL | INTRAVENOUS | Status: DC | PRN
  Administered 2017-03-04: 10 mL
  Filled 2017-03-04: qty 10

## 2017-03-04 MED ORDER — DEXAMETHASONE 4 MG PO TABS: 4.0000 mg | ORAL_TABLET | Freq: Two times a day (BID) | ORAL | 0 refills | Status: DC

## 2017-03-04 NOTE — Progress Notes (Signed)
[facility] OFFICE PROGRESS NOTE   Diagnosis: Peritoneal mesothelioma  INTERVAL HISTORY:   Frank Tucker returns as scheduled. He completed another treatment with Alimta/cisplatin on 02/11/2017. He reports nausea and vomiting during the week following chemotherapy. He took Decadron prophylaxis. The nausea resolved. Zofran helped the nausea. He continues to have a rash in the groin. The axillary rash has improved. No neuropathy symptoms. He underwent a paracentesis for 5 L of fluid on 03/03/2017.  Objective:  Vital signs in last 24 hours:  Blood pressure 119/76, pulse 63, temperature 97.7 F (36.5 C), temperature source Oral, resp. rate 18, height 5\' 10"  (1.778 m), weight 213 lb 6.4 oz (96.8 kg), SpO2 97 %.    HEENT: No thrush or ulcers Resp: Lungs clear bilaterally Cardio: Regular rate and rhythm GI: No hepatomegaly, mildly distended Vascular: Trace low pretibial and ankle edema bilaterally  Skin: Yeast rash in the groin bilaterally   Portacath/PICC-without erythema  Lab Results:  Lab Results  Component Value Date   WBC 5.7 03/04/2017   HGB 11.7 (L) 03/04/2017   HCT 33.4 (L) 03/04/2017   MCV 86.4 03/04/2017   PLT 656 (H) 03/04/2017   NEUTROABS 3.9 03/04/2017   Potassium 4.3, grinding 0.7, magnesium 1.4  Imaging:  US Paracentesis  Result Date: 03/03/2017 INDICATION: Peritoneal mesothelioma. Recurrent malignant ascites. Request therapeutic paracentesis of up to 5 L max. EXAM: ULTRASOUND GUIDED RIGHT LOWER QUADRANT PARACENTESIS MEDICATIONS: None. COMPLICATIONS: None immediate. PROCEDURE: Informed written consent was obtained from the patient after a discussion of the risks, benefits and alternatives to treatment. A timeout was performed prior to the initiation of the procedure. Initial ultrasound scanning demonstrates a large amount of ascites within the right lower abdominal quadrant. The right lower abdomen was prepped and draped in the usual sterile  fashion. 1% lidocaine with epinephrine was used for local anesthesia. Following this, a 19 gauge, 7-cm, Yueh catheter was introduced. An ultrasound image was saved for documentation purposes. The paracentesis was performed. The catheter was removed and a dressing was applied. The patient tolerated the procedure well without immediate post procedural complication. FINDINGS: A total of approximately 5 L of clear yellow fluid was removed. IMPRESSION: Successful ultrasound-guided paracentesis yielding 5 liters of peritoneal fluid. Read by: Ascencion Dike PA-C Electronically Signed   By: Aletta Edouard M.D.   On: 03/03/2017 13:28    Medications: I have reviewed the patient's current medications.  Assessment/Plan: 1. Abdominal carcinomatosis-primary peritoneal mesothelioma ? Paracentesis on 12/17/2016 confirmed malignant cells consistent with carcinoma, cytokeratin and WT-1 positive ? CTs of the chest, abdomen, and pelvis with ascites and omental caking. No other evidence of metastatic disease and no primary tumor site identified ? Upper endoscopy 12/29/2016-negative for malignancy ? PET scan 01/01/2017 with extensive hypermetabolic omental caking of tumor with extensive ascites, no primary tumor site identified ? 01/08/2017 omental mass biopsy-malignant mesothelioma ? Cycle 1 cisplatin/Alimta 01/21/2017 ? Cycle 2 cisplatin/Alimta 02/11/2017 ? Cycle 3 cisplatin/Alimta 03/04/2017   2. Yeast rash in the axilla and groin. Improved   3. Port-A-Cath placement 01/20/2017  4. Delayed nausea secondary to chemotherapy-persistent following cycle 2 despite Decadron prophylaxis  5. Malignant ascites    Disposition:  His overall status appears unchanged. He has completed 2 cycles of cisplatin/Alimta. The paracentesis procedures are less frequent. He will complete cycle 3 today. The plan is to schedule a restaging CT evaluation after cycle 4.  Frank Tucker has delayed nausea following chemotherapy. He will continue Decadron prophylaxis and will begin Zofran/metoclopramide beginning on  day 4. We reviewed the potential side effects of metoclopramide and he agrees to proceed.  He will let us know when he feels in need of another paracentesis. Frank Tucker will return for an office visit and the next cycle of chemotherapy on 03/25/2017.  25 minutes were spent with the patient today. The majority of the time was used for counseling and coordination of care.  Betsy Coder, MD  03/04/2017  9:08 AM

## 2017-03-04 NOTE — Patient Instructions (Signed)
The instructions for Dexamethasone have changed. Take one twice daily beginning the day after chemo. Take for 3 days.  Begin Reglan 3 days after chemo. Take before meals for 5 days then stop. Do not take Compazine while taking this medication. You may take Zofran for nausea beginning the 3rd day after treatment.

## 2017-03-04 NOTE — Telephone Encounter (Signed)
Scheduled appts per 03/04/2017 los. Patient to receive schedule in treatment area after treatment.

## 2017-03-04 NOTE — Patient Instructions (Signed)
[facility] Discharge Instructions for Patients Receiving Chemotherapy  Today you received the following chemotherapy agents: Alimta and Cisplatin.   To help prevent nausea and vomiting after your treatment, we encourage you to take your nausea medication as directed.    If you develop nausea and vomiting that is not controlled by your nausea medication, call the clinic.   BELOW ARE SYMPTOMS THAT SHOULD BE REPORTED IMMEDIATELY:  *FEVER GREATER THAN 100.5 F  *CHILLS WITH OR WITHOUT FEVER  NAUSEA AND VOMITING THAT IS NOT CONTROLLED WITH YOUR NAUSEA MEDICATION  *UNUSUAL SHORTNESS OF BREATH  *UNUSUAL BRUISING OR BLEEDING  TENDERNESS IN MOUTH AND THROAT WITH OR WITHOUT PRESENCE OF ULCERS  *URINARY PROBLEMS  *BOWEL PROBLEMS  UNUSUAL RASH Items with * indicate a potential emergency and should be followed up as soon as possible.  Feel free to call the clinic you have any questions or concerns. The clinic phone number is [phone].  Please show the CHEMO ALERT CARD at check-in to the Emergency Department and triage nurse.   

## 2017-03-04 NOTE — Patient Instructions (Signed)
Implanted Port Home Guide An implanted port is a type of central line that is placed under the skin. Central lines are used to provide IV access when treatment or nutrition needs to be given through a person's veins. Implanted ports are used for long-term IV access. An implanted port may be placed because:  You need IV medicine that would be irritating to the small veins in your hands or arms.  You need long-term IV medicines, such as antibiotics.  You need IV nutrition for a long period.  You need frequent blood draws for lab tests.  You need dialysis.  Implanted ports are usually placed in the chest area, but they can also be placed in the upper arm, the abdomen, or the leg. An implanted port has two main parts:  Reservoir. The reservoir is round and will appear as a small, raised area under your skin. The reservoir is the part where a needle is inserted to give medicines or draw blood.  Catheter. The catheter is a thin, flexible tube that extends from the reservoir. The catheter is placed into a large vein. Medicine that is inserted into the reservoir goes into the catheter and then into the vein.  How will I care for my incision site? Do not get the incision site wet. Bathe or shower as directed by your health care provider. How is my port accessed? Special steps must be taken to access the port:  Before the port is accessed, a numbing cream can be placed on the skin. This helps numb the skin over the port site.  Your health care provider uses a sterile technique to access the port. ? Your health care provider must put on a mask and sterile gloves. ? The skin over your port is cleaned carefully with an antiseptic and allowed to dry. ? The port is gently pinched between sterile gloves, and a needle is inserted into the port.  Only "non-coring" port needles should be used to access the port. Once the port is accessed, a blood return should be checked. This helps ensure that the port  is in the vein and is not clogged.  If your port needs to remain accessed for a constant infusion, a clear (transparent) bandage will be placed over the needle site. The bandage and needle will need to be changed every week, or as directed by your health care provider.  Keep the bandage covering the needle clean and dry. Do not get it wet. Follow your health care provider's instructions on how to take a shower or bath while the port is accessed.  If your port does not need to stay accessed, no bandage is needed over the port.  What is flushing? Flushing helps keep the port from getting clogged. Follow your health care provider's instructions on how and when to flush the port. Ports are usually flushed with saline solution or a medicine called heparin. The need for flushing will depend on how the port is used.  If the port is used for intermittent medicines or blood draws, the port will need to be flushed: ? After medicines have been given. ? After blood has been drawn. ? As part of routine maintenance.  If a constant infusion is running, the port may not need to be flushed.  How long will my port stay implanted? The port can stay in for as long as your health care provider thinks it is needed. When it is time for the port to come out, surgery will be   done to remove it. The procedure is similar to the one performed when the port was put in. When should I seek immediate medical care? When you have an implanted port, you should seek immediate medical care if:  You notice a bad smell coming from the incision site.  You have swelling, redness, or drainage at the incision site.  You have more swelling or pain at the port site or the surrounding area.  You have a fever that is not controlled with medicine.  This information is not intended to replace advice given to you by your health care provider. Make sure you discuss any questions you have with your health care provider. Document  Released: 12/07/2005 Document Revised: 05/14/2016 Document Reviewed: 08/14/2013 Elsevier Interactive Patient Education  2017 [employer].  

## 2017-03-04 NOTE — Progress Notes (Signed)
Nutrition follow-up completed with patient during chemotherapy for metastatic peritoneal carcinomatosis. Weight documented as 213.4 pounds March 15 decreased from 229.2 pounds February 22. Patient is status post paracentesis with 5 L fluid removed. Patient continues to have lower leg edema but reports this is improved. Patient reports nausea is improved if he eats small amounts of food States he no longer wants vegetables and feels that he is eating, high-protein diet. Complains of constipation with Ensure Plus.  Nutrition diagnosis: Food and nutrition related knowledge deficit improved.  Intervention: Patient educated to continue high-calorie high-protein foods in small frequent meals and snacks. Encouraged patient to try boost plus which has 3 g of fiber per 8 ounces or Carnation Instant Breakfast using fortified milk. Educated patient to continue increased fluids. Questions were answered.  Teach back method used.  Monitoring, evaluation, goals: Patient will continue to tolerate increased calories and protein to preserve lean body mass.  Next visit: Thursday, April 5, during infusion.  **Disclaimer: This note was dictated with voice recognition software. Similar sounding words can inadvertently be transcribed and this note may contain transcription errors which may not have been corrected upon publication of note.**

## 2017-03-09 ENCOUNTER — Other Ambulatory Visit: Payer: Self-pay | Admitting: *Deleted

## 2017-03-09 ENCOUNTER — Telehealth: Payer: Self-pay | Admitting: *Deleted

## 2017-03-09 DIAGNOSIS — C451 Mesothelioma of peritoneum: Secondary | ICD-10-CM

## 2017-03-09 DIAGNOSIS — C801 Malignant (primary) neoplasm, unspecified: Secondary | ICD-10-CM

## 2017-03-09 DIAGNOSIS — C786 Secondary malignant neoplasm of retroperitoneum and peritoneum: Secondary | ICD-10-CM

## 2017-03-09 NOTE — Telephone Encounter (Signed)
"  I'm calling to schedule an appointment for  A paracentesis.  Dr.Sherrill has instructed me to call whenever I need one which is every week or two.  Could we schedule paracentesis for this Friday?  Return number [phone]."    Denies shortness of breath, pain or pressure to abdomen or any other signs of distress.

## 2017-03-09 NOTE — Telephone Encounter (Signed)
Order placed for US paracentesis and central scheduling notified to call patient with time for paracentesis this Friday, 03/12/17.

## 2017-03-10 NOTE — Telephone Encounter (Signed)
Call from pt requesting we change paracentesis orders to reflect higher maximum volume to be removed. Per Dr. Benay Spice: No maximum volume. Order entered, radiology schedulers notified. Pt also requested physician signature on accelerated benefit insurance form. Received form by fax. Placed on MD desk for signature.

## 2017-03-12 ENCOUNTER — Ambulatory Visit (HOSPITAL_COMMUNITY)
Admission: RE | Admit: 2017-03-12 | Discharge: 2017-03-12 | Disposition: A | Discharge status: Home / Self Care | Payer: PRIVATE HEALTH INSURANCE | Source: Ambulatory Visit | Attending: Oncology | Admitting: Oncology

## 2017-03-12 DIAGNOSIS — C451 Mesothelioma of peritoneum: Secondary | ICD-10-CM | POA: Insufficient documentation

## 2017-03-12 DIAGNOSIS — R180 Malignant ascites: Secondary | ICD-10-CM | POA: Diagnosis present

## 2017-03-12 DIAGNOSIS — C786 Secondary malignant neoplasm of retroperitoneum and peritoneum: Secondary | ICD-10-CM | POA: Insufficient documentation

## 2017-03-12 DIAGNOSIS — C801 Malignant (primary) neoplasm, unspecified: Secondary | ICD-10-CM

## 2017-03-12 NOTE — Procedures (Signed)
Ultrasound-guided  therapeutic paracentesis performed yielding 6.2 liters of gelatinous, yellow fluid. No immediate complications.

## 2017-03-16 ENCOUNTER — Telehealth: Payer: Self-pay | Admitting: Oncology

## 2017-03-16 ENCOUNTER — Other Ambulatory Visit: Payer: Self-pay | Admitting: Nurse Practitioner

## 2017-03-16 ENCOUNTER — Telehealth: Payer: Self-pay | Admitting: *Deleted

## 2017-03-16 DIAGNOSIS — C801 Malignant (primary) neoplasm, unspecified: Secondary | ICD-10-CM

## 2017-03-16 DIAGNOSIS — C786 Secondary malignant neoplasm of retroperitoneum and peritoneum: Secondary | ICD-10-CM

## 2017-03-16 DIAGNOSIS — C451 Mesothelioma of peritoneum: Secondary | ICD-10-CM

## 2017-03-16 NOTE — Telephone Encounter (Signed)
Pt called to follow up on [employer] forms. Informed him they have been faxed. He will pick up originals from front desk. Pt also requested medical records to attach to the claim. Informed he will need to sign for this in HIM dept on 2nd floor. He voiced understanding. HIM notified of request.

## 2017-03-16 NOTE — Telephone Encounter (Signed)
Faxed [employer] Statement to [place] fax [phone]

## 2017-03-18 ENCOUNTER — Other Ambulatory Visit: Payer: Self-pay | Admitting: *Deleted

## 2017-03-18 DIAGNOSIS — C451 Mesothelioma of peritoneum: Secondary | ICD-10-CM

## 2017-03-18 MED ORDER — METOCLOPRAMIDE HCL 10 MG PO TABS: 10.0000 mg | ORAL_TABLET | Freq: Three times a day (TID) | ORAL | 0 refills | Status: AC

## 2017-03-18 MED ORDER — DEXAMETHASONE 4 MG PO TABS: 4.0000 mg | ORAL_TABLET | Freq: Two times a day (BID) | ORAL | 1 refills | Status: DC

## 2017-03-18 NOTE — Telephone Encounter (Signed)
Received refill request for Reglan. Noted last script was filled for 2 cycles of chemo. Called pt, he reports he continued Reglan because it helped with nausea. Still has daily nausea but he reports this is improved with Reglan and daily Zofran. Pt did not take Reglan today and reports he "can tell the difference."

## 2017-03-18 NOTE — Telephone Encounter (Signed)
Per Dr. Benay Spice: Change Reglan to T[facility] continuous. Scripts e-scribed.

## 2017-03-21 ENCOUNTER — Other Ambulatory Visit: Payer: Self-pay | Admitting: Oncology

## 2017-03-22 ENCOUNTER — Other Ambulatory Visit: Payer: Self-pay | Admitting: *Deleted

## 2017-03-22 DIAGNOSIS — C451 Mesothelioma of peritoneum: Secondary | ICD-10-CM

## 2017-03-22 MED ORDER — DEXAMETHASONE 4 MG PO TABS
4.0000 mg | ORAL_TABLET | Freq: Two times a day (BID) | ORAL | 1 refills | Status: DC
Start: 2017-03-22 — End: 2017-05-20

## 2017-03-24 ENCOUNTER — Ambulatory Visit (HOSPITAL_COMMUNITY)
Admission: RE | Admit: 2017-03-24 | Discharge: 2017-03-24 | Disposition: A | Discharge status: Home / Self Care | Payer: PRIVATE HEALTH INSURANCE | Source: Ambulatory Visit | Attending: Oncology | Admitting: Oncology

## 2017-03-24 DIAGNOSIS — R180 Malignant ascites: Secondary | ICD-10-CM | POA: Diagnosis present

## 2017-03-24 DIAGNOSIS — C451 Mesothelioma of peritoneum: Secondary | ICD-10-CM | POA: Diagnosis present

## 2017-03-24 NOTE — Procedures (Signed)
PROCEDURE SUMMARY:  Successful US guided paracentesis from right lateral abdomen.  Yielded 6.1L of thick yellow fluid.  No immediate complications.  Pt tolerated well.   Specimen was not sent for labs.  Docia Barrier PA-C 03/24/2017 11:43 AM

## 2017-03-25 ENCOUNTER — Other Ambulatory Visit (HOSPITAL_BASED_OUTPATIENT_CLINIC_OR_DEPARTMENT_OTHER): Payer: PRIVATE HEALTH INSURANCE

## 2017-03-25 ENCOUNTER — Ambulatory Visit (HOSPITAL_BASED_OUTPATIENT_CLINIC_OR_DEPARTMENT_OTHER): Payer: PRIVATE HEALTH INSURANCE

## 2017-03-25 ENCOUNTER — Ambulatory Visit (HOSPITAL_BASED_OUTPATIENT_CLINIC_OR_DEPARTMENT_OTHER): Payer: PRIVATE HEALTH INSURANCE | Admitting: Oncology

## 2017-03-25 ENCOUNTER — Ambulatory Visit: Payer: PRIVATE HEALTH INSURANCE | Admitting: Nutrition

## 2017-03-25 ENCOUNTER — Ambulatory Visit: Payer: PRIVATE HEALTH INSURANCE

## 2017-03-25 ENCOUNTER — Other Ambulatory Visit: Payer: Self-pay | Admitting: *Deleted

## 2017-03-25 VITALS — BP 125/70 | HR 64 | Resp 18

## 2017-03-25 DIAGNOSIS — Z5111 Encounter for antineoplastic chemotherapy: Secondary | ICD-10-CM

## 2017-03-25 DIAGNOSIS — C451 Mesothelioma of peritoneum: Secondary | ICD-10-CM

## 2017-03-25 DIAGNOSIS — C786 Secondary malignant neoplasm of retroperitoneum and peritoneum: Secondary | ICD-10-CM

## 2017-03-25 DIAGNOSIS — C801 Malignant (primary) neoplasm, unspecified: Principal | ICD-10-CM

## 2017-03-25 DIAGNOSIS — R109 Unspecified abdominal pain: Secondary | ICD-10-CM

## 2017-03-25 DIAGNOSIS — R180 Malignant ascites: Secondary | ICD-10-CM | POA: Diagnosis not present

## 2017-03-25 DIAGNOSIS — Z95828 Presence of other vascular implants and grafts: Secondary | ICD-10-CM

## 2017-03-25 LAB — COMPREHENSIVE METABOLIC PANEL
ALT: 8 U/L (ref 0–55)
AST: 9 U/L (ref 5–34)
Albumin: 2.1 g/dL — ABNORMAL LOW (ref 3.5–5.0)
Alkaline Phosphatase: 71 U/L (ref 40–150)
Anion Gap: 9 mEq/L (ref 3–11)
BUN: 9.3 mg/dL (ref 7.0–26.0)
CO2: 26 meq/L (ref 22–29)
Calcium: 9 mg/dL (ref 8.4–10.4)
Chloride: 101 mEq/L (ref 98–109)
Creatinine: 0.8 mg/dL (ref 0.7–1.3)
Glucose: 82 mg/dl (ref 70–140)
POTASSIUM: 4.2 meq/L (ref 3.5–5.1)
SODIUM: 137 meq/L (ref 136–145)
TOTAL PROTEIN: 5.8 g/dL — AB (ref 6.4–8.3)

## 2017-03-25 LAB — CBC WITH DIFFERENTIAL/PLATELET
BASO%: 0.2 % (ref 0.0–2.0)
Basophils Absolute: 0 10*3/uL (ref 0.0–0.1)
EOS ABS: 0 10*3/uL (ref 0.0–0.5)
EOS%: 0 % (ref 0.0–7.0)
HCT: 28.1 % — ABNORMAL LOW (ref 38.4–49.9)
HGB: 9.4 g/dL — ABNORMAL LOW (ref 13.0–17.1)
LYMPH%: 20.9 % (ref 14.0–49.0)
MCH: 29.7 pg (ref 27.2–33.4)
MCHC: 33.5 g/dL (ref 32.0–36.0)
MCV: 88.6 fL (ref 79.3–98.0)
MONO#: 1.7 10*3/uL — AB (ref 0.1–0.9)
MONO%: 31.4 % — AB (ref 0.0–14.0)
NEUT#: 2.6 10*3/uL (ref 1.5–6.5)
NEUT%: 47.5 % (ref 39.0–75.0)
Platelets: 713 10*3/uL — ABNORMAL HIGH (ref 140–400)
RBC: 3.17 10*6/uL — AB (ref 4.20–5.82)
RDW: 16.7 % — ABNORMAL HIGH (ref 11.0–14.6)
WBC: 5.5 10*3/uL (ref 4.0–10.3)
lymph#: 1.2 10*3/uL (ref 0.9–3.3)

## 2017-03-25 LAB — MAGNESIUM: Magnesium: 1.6 mg/dl (ref 1.5–2.5)

## 2017-03-25 MED ORDER — SODIUM CHLORIDE 0.9 % IV SOLN
75.0000 mg/m2 | Freq: Once | INTRAVENOUS | Status: AC
  Administered 2017-03-25: 170 mg via INTRAVENOUS
  Filled 2017-03-25: qty 170

## 2017-03-25 MED ORDER — OXYCODONE-ACETAMINOPHEN 5-325 MG PO TABS: 1.0000 | ORAL_TABLET | ORAL | 0 refills | Status: DC | PRN

## 2017-03-25 MED ORDER — SODIUM CHLORIDE 0.9 % IV SOLN
1100.0000 mg | Freq: Once | INTRAVENOUS | Status: AC
  Administered 2017-03-25: 1100 mg via INTRAVENOUS
  Filled 2017-03-25: qty 40

## 2017-03-25 MED ORDER — SODIUM CHLORIDE 0.9% FLUSH
10.0000 mL | INTRAVENOUS | Status: DC | PRN
  Administered 2017-03-25: 10 mL via INTRAVENOUS
  Filled 2017-03-25: qty 10

## 2017-03-25 MED ORDER — SODIUM CHLORIDE 0.9 % IV SOLN
Freq: Once | INTRAVENOUS | Status: AC
  Administered 2017-03-25: 14:00:00 via INTRAVENOUS
  Filled 2017-03-25: qty 5

## 2017-03-25 MED ORDER — SODIUM CHLORIDE 0.9% FLUSH
10.0000 mL | INTRAVENOUS | Status: DC | PRN
  Administered 2017-03-25: 10 mL
  Filled 2017-03-25: qty 10

## 2017-03-25 MED ORDER — HEPARIN SOD (PORK) LOCK FLUSH 100 UNIT/ML IV SOLN
500.0000 [IU] | Freq: Once | INTRAVENOUS | Status: AC | PRN
  Administered 2017-03-25: 500 [IU]
  Filled 2017-03-25: qty 5

## 2017-03-25 MED ORDER — PALONOSETRON HCL INJECTION 0.25 MG/5ML
INTRAVENOUS | Status: AC
  Filled 2017-03-25: qty 5

## 2017-03-25 MED ORDER — PALONOSETRON HCL INJECTION 0.25 MG/5ML
0.2500 mg | Freq: Once | INTRAVENOUS | Status: AC
  Administered 2017-03-25: 0.25 mg via INTRAVENOUS

## 2017-03-25 MED ORDER — POTASSIUM CHLORIDE 2 MEQ/ML IV SOLN
Freq: Once | INTRAVENOUS | Status: AC
  Administered 2017-03-25: 11:00:00 via INTRAVENOUS
  Filled 2017-03-25: qty 10

## 2017-03-25 NOTE — Progress Notes (Signed)
Nutrition follow-up completed with patient during chemotherapy for metastatic peritoneal carcinomatosis. Weight continues to decrease was documented as 206.6 pounds April  5. Patient is status post 6 L of fluid removed via paracentesis yesterday. Patient states he has continued nausea and vomiting with chemotherapy. He does continue to have lower leg edema. Also reports constipation alternating with diarrhea.  He takes MiraLAX, Senokot as needed. Repeats that he feels he is getting adequate protein.  Nutrition diagnosis:  Food and nutrition related knowledge deficit improved.  Intervention: Educated patient to continue strategies for high-calorie, high-protein foods to minimize loss of lean body mass. Encouraged patient to continue increased fluid intake before chemotherapy. Questions answered.  Teach back method used.  Monitoring, evaluation, goals: Patient will tolerate increased calories and protein to preserve lean body mass.  Next visit: To be scheduled with treatments as needed.  **Disclaimer: This note was dictated with voice recognition software. Similar sounding words can inadvertently be transcribed and this note may contain transcription errors which may not have been corrected upon publication of note.**

## 2017-03-25 NOTE — Patient Instructions (Signed)
Implanted Port Home Guide An implanted port is a type of central line that is placed under the skin. Central lines are used to provide IV access when treatment or nutrition needs to be given through a person's veins. Implanted ports are used for long-term IV access. An implanted port may be placed because:  You need IV medicine that would be irritating to the small veins in your hands or arms.  You need long-term IV medicines, such as antibiotics.  You need IV nutrition for a long period.  You need frequent blood draws for lab tests.  You need dialysis.  Implanted ports are usually placed in the chest area, but they can also be placed in the upper arm, the abdomen, or the leg. An implanted port has two main parts:  Reservoir. The reservoir is round and will appear as a small, raised area under your skin. The reservoir is the part where a needle is inserted to give medicines or draw blood.  Catheter. The catheter is a thin, flexible tube that extends from the reservoir. The catheter is placed into a large vein. Medicine that is inserted into the reservoir goes into the catheter and then into the vein.  How will I care for my incision site? Do not get the incision site wet. Bathe or shower as directed by your health care provider. How is my port accessed? Special steps must be taken to access the port:  Before the port is accessed, a numbing cream can be placed on the skin. This helps numb the skin over the port site.  Your health care provider uses a sterile technique to access the port. ? Your health care provider must put on a mask and sterile gloves. ? The skin over your port is cleaned carefully with an antiseptic and allowed to dry. ? The port is gently pinched between sterile gloves, and a needle is inserted into the port.  Only "non-coring" port needles should be used to access the port. Once the port is accessed, a blood return should be checked. This helps ensure that the port  is in the vein and is not clogged.  If your port needs to remain accessed for a constant infusion, a clear (transparent) bandage will be placed over the needle site. The bandage and needle will need to be changed every week, or as directed by your health care provider.  Keep the bandage covering the needle clean and dry. Do not get it wet. Follow your health care provider's instructions on how to take a shower or bath while the port is accessed.  If your port does not need to stay accessed, no bandage is needed over the port.  What is flushing? Flushing helps keep the port from getting clogged. Follow your health care provider's instructions on how and when to flush the port. Ports are usually flushed with saline solution or a medicine called heparin. The need for flushing will depend on how the port is used.  If the port is used for intermittent medicines or blood draws, the port will need to be flushed: ? After medicines have been given. ? After blood has been drawn. ? As part of routine maintenance.  If a constant infusion is running, the port may not need to be flushed.  How long will my port stay implanted? The port can stay in for as long as your health care provider thinks it is needed. When it is time for the port to come out, surgery will be   done to remove it. The procedure is similar to the one performed when the port was put in. When should I seek immediate medical care? When you have an implanted port, you should seek immediate medical care if:  You notice a bad smell coming from the incision site.  You have swelling, redness, or drainage at the incision site.  You have more swelling or pain at the port site or the surrounding area.  You have a fever that is not controlled with medicine.  This information is not intended to replace advice given to you by your health care provider. Make sure you discuss any questions you have with your health care provider. Document  Released: 12/07/2005 Document Revised: 05/14/2016 Document Reviewed: 08/14/2013 Elsevier Interactive Patient Education  2017 [employer].  

## 2017-03-25 NOTE — Progress Notes (Signed)
[facility] OFFICE PROGRESS NOTE   Diagnosis: Peritoneal mesothelioma  INTERVAL HISTORY:   [name] returns as scheduled. He completed cycle 3 cisplatin/Alimta 03/04/2017. He had nausea following chemotherapy, but this was improved. He continues to take oxycodone for abdominal pain. He underwent a therapeutic paracentesis yesterday and 6 L of fluid were removed. No neuropathy symptoms.  Objective:  Vital signs in last 24 hours:  Blood pressure 121/68, pulse 65, temperature 97.8 F (36.6 C), temperature source Oral, resp. rate 18, height 5\' 10"  (1.778 m), weight 206 lb 9.6 oz (93.7 kg), SpO2 99 %.    HEENT: No thrush or ulcers Resp: Lungs clear bilaterally Cardio: Regular rate and rhythm GI: No hepatosplenomegaly, soft and nontender Vascular: Pitting edema at the right greater than left lower leg and ankle Skin: Yeast rash at the groin-improved    Portacath/PICC-without erythema  Lab Results:  Lab Results  Component Value Date   WBC 5.5 03/25/2017   HGB 9.4 (L) 03/25/2017   HCT 28.1 (L) 03/25/2017   MCV 88.6 03/25/2017   PLT 713 (H) 03/25/2017   NEUTROABS 2.6 03/25/2017     Imaging:  US Paracentesis  Result Date: 03/24/2017 INDICATION: Patient with history of mesothelioma of the peritoneum, recurrent ascites. Request is made for therapeutic paracentesis. EXAM: ULTRASOUND GUIDED THERAPEUTIC PARACENTESIS MEDICATIONS: 10 mL 1% lidocaine COMPLICATIONS: None immediate. PROCEDURE: Informed written consent was obtained from the patient after a discussion of the risks, benefits and alternatives to treatment. A timeout was performed prior to the initiation of the procedure. Initial ultrasound scanning demonstrates a large amount of ascites within the right lateral abdomen. The right lateral abdomen was prepped and draped in the usual sterile fashion. 1% lidocaine was used for local anesthesia. Following this, a 19 gauge, 7-cm, Yueh catheter was introduced. An  ultrasound image was saved for documentation purposes. The paracentesis was performed. The catheter was removed and a dressing was applied. The patient tolerated the procedure well without immediate post procedural complication. FINDINGS: A total of approximately 6.1 liters of thick yellow fluid was removed. Samples were sent to the laboratory as requested by the clinical team. IMPRESSION: Successful ultrasound-guided paracentesis yielding 6.1 liters of peritoneal fluid. Read by:  Brynda Greathouse PA-C Electronically Signed   By: Corrie Mckusick D.O.   On: 03/24/2017 12:09    Medications: I have reviewed the patient's current medications.  Assessment/Plan: 1. Abdominal carcinomatosis-primary peritoneal mesothelioma ? Paracentesis on 12/17/2016 confirmed malignant cells consistent with carcinoma, cytokeratin and WT-1 positive ? CTs of the chest, abdomen, and pelvis with ascites and omental caking. No other evidence of metastatic disease and no primary tumor site identified ? Upper endoscopy 12/29/2016-negative for malignancy ? PET scan 01/01/2017 with extensive hypermetabolic omental caking of tumor with extensive ascites, no primary tumor site identified ? 01/08/2017 omental mass biopsy-malignant mesothelioma ? Cycle 1 cisplatin/Alimta 01/21/2017 ? Cycle 2 cisplatin/Alimta 02/11/2017 ? Cycle 3 cisplatin/Alimta 03/04/2017 ? Cycle 4 cisplatin/Alimta 03/25/2017   2. Yeast rash in the axilla and groin. Improved   3. Port-A-Cath placement 01/20/2017  4. Delayed nauseasecondary to chemotherapy-persistent following cycle 2 despite Decadron prophylaxis, improved with cycles 3 with the addition of Reglan  5. Malignant ascites   Disposition:  [name] appears stable. He has completed 3 cycles of cisplatin/Alimta. The plan is to proceed with cycle 4 today. He will undergo a restaging CT evaluation after this cycle. He will continue oxycodone for  pain. He will continue the current anti-emetic regimen.  [name] will undergo  a therapeutic paracentesis as needed.   We discussed his prognosis with regard to a life insurance policy.  Betsy Coder, MD  03/25/2017  9:11 AM

## 2017-03-25 NOTE — Patient Instructions (Signed)
[facility] Discharge Instructions for Patients Receiving Chemotherapy  Today you received the following chemotherapy agents: Alimta and Cisplatin.   To help prevent nausea and vomiting after your treatment, we encourage you to take your nausea medication as directed.    If you develop nausea and vomiting that is not controlled by your nausea medication, call the clinic.   BELOW ARE SYMPTOMS THAT SHOULD BE REPORTED IMMEDIATELY:  *FEVER GREATER THAN 100.5 F  *CHILLS WITH OR WITHOUT FEVER  NAUSEA AND VOMITING THAT IS NOT CONTROLLED WITH YOUR NAUSEA MEDICATION  *UNUSUAL SHORTNESS OF BREATH  *UNUSUAL BRUISING OR BLEEDING  TENDERNESS IN MOUTH AND THROAT WITH OR WITHOUT PRESENCE OF ULCERS  *URINARY PROBLEMS  *BOWEL PROBLEMS  UNUSUAL RASH Items with * indicate a potential emergency and should be followed up as soon as possible.  Feel free to call the clinic you have any questions or concerns. The clinic phone number is [phone].  Please show the CHEMO ALERT CARD at check-in to the Emergency Department and triage nurse.   

## 2017-03-26 ENCOUNTER — Other Ambulatory Visit: Payer: Self-pay | Admitting: *Deleted

## 2017-03-26 DIAGNOSIS — C451 Mesothelioma of peritoneum: Secondary | ICD-10-CM

## 2017-03-26 DIAGNOSIS — C786 Secondary malignant neoplasm of retroperitoneum and peritoneum: Secondary | ICD-10-CM

## 2017-03-26 DIAGNOSIS — C801 Malignant (primary) neoplasm, unspecified: Secondary | ICD-10-CM

## 2017-03-26 MED ORDER — ONDANSETRON HCL 8 MG PO TABS: 8.0000 mg | ORAL_TABLET | Freq: Three times a day (TID) | ORAL | 2 refills | Status: DC | PRN

## 2017-03-26 NOTE — Telephone Encounter (Signed)
Sent Zofran script to [facility] for cost comparison.

## 2017-03-31 ENCOUNTER — Encounter (HOSPITAL_COMMUNITY): Payer: Self-pay

## 2017-03-31 ENCOUNTER — Ambulatory Visit (HOSPITAL_BASED_OUTPATIENT_CLINIC_OR_DEPARTMENT_OTHER): Payer: PRIVATE HEALTH INSURANCE | Admitting: Nurse Practitioner

## 2017-03-31 ENCOUNTER — Inpatient Hospital Stay (HOSPITAL_COMMUNITY)
Admission: EM | Admit: 2017-03-31 | Discharge: 2017-04-08 | DRG: 374 | Disposition: A | Discharge status: Home / Self Care | Payer: PRIVATE HEALTH INSURANCE | Attending: Internal Medicine | Admitting: Internal Medicine

## 2017-03-31 ENCOUNTER — Telehealth: Payer: Self-pay | Admitting: *Deleted

## 2017-03-31 ENCOUNTER — Emergency Department (HOSPITAL_COMMUNITY): Payer: PRIVATE HEALTH INSURANCE

## 2017-03-31 ENCOUNTER — Encounter: Payer: Self-pay | Admitting: Nurse Practitioner

## 2017-03-31 ENCOUNTER — Encounter (HOSPITAL_COMMUNITY): Payer: Self-pay | Admitting: Radiology

## 2017-03-31 ENCOUNTER — Telehealth: Payer: Self-pay | Admitting: Oncology

## 2017-03-31 ENCOUNTER — Ambulatory Visit (HOSPITAL_BASED_OUTPATIENT_CLINIC_OR_DEPARTMENT_OTHER): Payer: PRIVATE HEALTH INSURANCE

## 2017-03-31 DIAGNOSIS — C786 Secondary malignant neoplasm of retroperitoneum and peritoneum: Principal | ICD-10-CM | POA: Diagnosis present

## 2017-03-31 DIAGNOSIS — T451X5A Adverse effect of antineoplastic and immunosuppressive drugs, initial encounter: Secondary | ICD-10-CM | POA: Diagnosis present

## 2017-03-31 DIAGNOSIS — K56600 Partial intestinal obstruction, unspecified as to cause: Secondary | ICD-10-CM | POA: Diagnosis present

## 2017-03-31 DIAGNOSIS — D6481 Anemia due to antineoplastic chemotherapy: Secondary | ICD-10-CM | POA: Diagnosis present

## 2017-03-31 DIAGNOSIS — K56609 Unspecified intestinal obstruction, unspecified as to partial versus complete obstruction: Secondary | ICD-10-CM | POA: Diagnosis present

## 2017-03-31 DIAGNOSIS — C801 Malignant (primary) neoplasm, unspecified: Secondary | ICD-10-CM

## 2017-03-31 DIAGNOSIS — K5669 Other partial intestinal obstruction: Secondary | ICD-10-CM | POA: Diagnosis present

## 2017-03-31 DIAGNOSIS — D701 Agranulocytosis secondary to cancer chemotherapy: Secondary | ICD-10-CM | POA: Diagnosis present

## 2017-03-31 DIAGNOSIS — Z808 Family history of malignant neoplasm of other organs or systems: Secondary | ICD-10-CM

## 2017-03-31 DIAGNOSIS — Z8249 Family history of ischemic heart disease and other diseases of the circulatory system: Secondary | ICD-10-CM | POA: Diagnosis not present

## 2017-03-31 DIAGNOSIS — Z4659 Encounter for fitting and adjustment of other gastrointestinal appliance and device: Secondary | ICD-10-CM

## 2017-03-31 DIAGNOSIS — R180 Malignant ascites: Secondary | ICD-10-CM | POA: Diagnosis present

## 2017-03-31 DIAGNOSIS — D638 Anemia in other chronic diseases classified elsewhere: Secondary | ICD-10-CM | POA: Diagnosis present

## 2017-03-31 DIAGNOSIS — E860 Dehydration: Secondary | ICD-10-CM

## 2017-03-31 DIAGNOSIS — E871 Hypo-osmolality and hyponatremia: Secondary | ICD-10-CM | POA: Diagnosis present

## 2017-03-31 DIAGNOSIS — C451 Mesothelioma of peritoneum: Secondary | ICD-10-CM

## 2017-03-31 DIAGNOSIS — R112 Nausea with vomiting, unspecified: Secondary | ICD-10-CM | POA: Diagnosis present

## 2017-03-31 DIAGNOSIS — B372 Candidiasis of skin and nail: Secondary | ICD-10-CM | POA: Diagnosis present

## 2017-03-31 DIAGNOSIS — R188 Other ascites: Secondary | ICD-10-CM | POA: Diagnosis not present

## 2017-03-31 DIAGNOSIS — E861 Hypovolemia: Secondary | ICD-10-CM | POA: Diagnosis present

## 2017-03-31 DIAGNOSIS — R109 Unspecified abdominal pain: Secondary | ICD-10-CM

## 2017-03-31 DIAGNOSIS — R944 Abnormal results of kidney function studies: Secondary | ICD-10-CM | POA: Diagnosis not present

## 2017-03-31 DIAGNOSIS — R5383 Other fatigue: Secondary | ICD-10-CM | POA: Diagnosis not present

## 2017-03-31 DIAGNOSIS — R634 Abnormal weight loss: Secondary | ICD-10-CM | POA: Diagnosis not present

## 2017-03-31 DIAGNOSIS — E43 Unspecified severe protein-calorie malnutrition: Secondary | ICD-10-CM | POA: Diagnosis present

## 2017-03-31 DIAGNOSIS — D630 Anemia in neoplastic disease: Secondary | ICD-10-CM | POA: Diagnosis present

## 2017-03-31 DIAGNOSIS — N179 Acute kidney failure, unspecified: Secondary | ICD-10-CM | POA: Diagnosis present

## 2017-03-31 DIAGNOSIS — R531 Weakness: Secondary | ICD-10-CM | POA: Diagnosis not present

## 2017-03-31 DIAGNOSIS — Z6828 Body mass index (BMI) 28.0-28.9, adult: Secondary | ICD-10-CM

## 2017-03-31 DIAGNOSIS — D860 Sarcoidosis of lung: Secondary | ICD-10-CM | POA: Diagnosis not present

## 2017-03-31 DIAGNOSIS — Z0189 Encounter for other specified special examinations: Secondary | ICD-10-CM | POA: Diagnosis not present

## 2017-03-31 DIAGNOSIS — E876 Hypokalemia: Secondary | ICD-10-CM | POA: Diagnosis present

## 2017-03-31 LAB — COMPREHENSIVE METABOLIC PANEL
ALT: 12 U/L (ref 0–55)
ALT: 12 U/L — ABNORMAL LOW (ref 17–63)
ANION GAP: 15 meq/L — AB (ref 3–11)
AST: 11 U/L (ref 5–34)
AST: 20 U/L (ref 15–41)
Albumin: 2.4 g/dL — ABNORMAL LOW (ref 3.5–5.0)
Albumin: 2.4 g/dL — ABNORMAL LOW (ref 3.5–5.0)
Alkaline Phosphatase: 71 U/L (ref 40–150)
Alkaline Phosphatase: 55 U/L (ref 38–126)
Anion gap: 11 (ref 5–15)
BUN: 34.1 mg/dL — ABNORMAL HIGH (ref 7.0–26.0)
BUN: 35 mg/dL — ABNORMAL HIGH (ref 6–20)
CALCIUM: 9 mg/dL (ref 8.4–10.4)
CHLORIDE: 92 meq/L — AB (ref 98–109)
CHLORIDE: 92 mmol/L — AB (ref 101–111)
CO2: 28 meq/L (ref 22–29)
CO2: 30 mmol/L (ref 22–32)
CREATININE: 1.8 mg/dL — AB (ref 0.7–1.3)
Calcium: 8.4 mg/dL — ABNORMAL LOW (ref 8.9–10.3)
Creatinine, Ser: 1.61 mg/dL — ABNORMAL HIGH (ref 0.61–1.24)
EGFR: 43 mL/min/{1.73_m2} — AB (ref >90)
GFR, EST AFRICAN AMERICAN: 54 mL/min — AB (ref >60)
GFR, EST NON AFRICAN AMERICAN: 47 mL/min — AB (ref >60)
Glucose, Bld: 99 mg/dL (ref 65–99)
Glucose: 117 mg/dl (ref 70–140)
POTASSIUM: 4.6 mmol/L (ref 3.5–5.1)
Potassium: 4.7 mEq/L (ref 3.5–5.1)
Sodium: 135 mEq/L — ABNORMAL LOW (ref 136–145)
Sodium: 133 mmol/L — ABNORMAL LOW (ref 135–145)
Total Bilirubin: 0.34 mg/dL (ref 0.20–1.20)
Total Bilirubin: 0.8 mg/dL (ref 0.3–1.2)
Total Protein: 6.2 g/dL — ABNORMAL LOW (ref 6.4–8.3)
Total Protein: 5.8 g/dL — ABNORMAL LOW (ref 6.5–8.1)

## 2017-03-31 LAB — CBC WITH DIFFERENTIAL/PLATELET
BASO%: 0 % (ref 0.0–2.0)
BASOS ABS: 0 10*3/uL (ref 0.0–0.1)
Basophils Absolute: 0 10*3/uL (ref 0.0–0.1)
Basophils Relative: 0 %
EOS ABS: 0 10*3/uL (ref 0.0–0.5)
EOS PCT: 1 %
EOS%: 0 % (ref 0.0–7.0)
Eosinophils Absolute: 0 10*3/uL (ref 0.0–0.7)
HCT: 34.5 % — ABNORMAL LOW (ref 38.4–49.9)
HCT: 32.2 % — ABNORMAL LOW (ref 39.0–52.0)
HGB: 11.9 g/dL — ABNORMAL LOW (ref 13.0–17.1)
Hemoglobin: 11.1 g/dL — ABNORMAL LOW (ref 13.0–17.0)
LYMPH%: 40.7 % (ref 14.0–49.0)
Lymphocytes Relative: 48 %
Lymphs Abs: 0.6 10*3/uL — ABNORMAL LOW (ref 0.7–4.0)
MCH: 30 pg (ref 27.2–33.4)
MCH: 30.2 pg (ref 26.0–34.0)
MCHC: 34.5 g/dL (ref 32.0–36.0)
MCHC: 34.5 g/dL (ref 30.0–36.0)
MCV: 86.9 fL (ref 79.3–98.0)
MCV: 87.5 fL (ref 78.0–100.0)
MONO#: 0.1 10*3/uL (ref 0.1–0.9)
MONO%: 9.7 % (ref 0.0–14.0)
Monocytes Absolute: 0.2 10*3/uL (ref 0.1–1.0)
Monocytes Relative: 13 %
NEUT%: 49.6 % (ref 39.0–75.0)
NEUTROS ABS: 0.6 10*3/uL — AB (ref 1.5–6.5)
Neutro Abs: 0.5 10*3/uL — ABNORMAL LOW (ref 1.7–7.7)
Neutrophils Relative %: 38 %
PLATELETS: 297 10*3/uL (ref 140–400)
PLATELETS: 313 10*3/uL (ref 150–400)
RBC: 3.97 10*6/uL — AB (ref 4.20–5.82)
RBC: 3.68 MIL/uL — AB (ref 4.22–5.81)
RDW: 16.5 % — ABNORMAL HIGH (ref 11.0–14.6)
RDW: 16.5 % — AB (ref 11.5–15.5)
WBC: 1.1 10*3/uL — AB (ref 4.0–10.3)
WBC: 1.3 10*3/uL — AB (ref 4.0–10.5)
lymph#: 0.5 10*3/uL — ABNORMAL LOW (ref 0.9–3.3)
nRBC: 0 % (ref 0–0)

## 2017-03-31 LAB — LIPASE, BLOOD: LIPASE: 11 U/L (ref 11–51)

## 2017-03-31 MED ORDER — ONDANSETRON HCL 4 MG/2ML IJ SOLN
INTRAMUSCULAR | Status: AC
  Filled 2017-03-31: qty 4

## 2017-03-31 MED ORDER — IOPAMIDOL (ISOVUE-300) INJECTION 61%
INTRAVENOUS | Status: AC
  Filled 2017-03-31: qty 75

## 2017-03-31 MED ORDER — SODIUM CHLORIDE 0.9 % IV SOLN: Freq: Once | INTRAVENOUS | Status: DC

## 2017-03-31 MED ORDER — SODIUM CHLORIDE 0.9 % IV SOLN
Freq: Once | INTRAVENOUS | Status: AC
  Administered 2017-03-31: 15:00:00 via INTRAVENOUS

## 2017-03-31 MED ORDER — SODIUM CHLORIDE 0.9 % IV SOLN
INTRAVENOUS | Status: AC
  Administered 2017-03-31 (×2): via INTRAVENOUS

## 2017-03-31 MED ORDER — ONDANSETRON HCL 4 MG/2ML IJ SOLN
4.0000 mg | Freq: Four times a day (QID) | INTRAMUSCULAR | Status: DC | PRN
  Administered 2017-04-01 – 2017-04-08 (×4): 4 mg via INTRAVENOUS
  Filled 2017-03-31 (×5): qty 2

## 2017-03-31 MED ORDER — MORPHINE SULFATE 4 MG/ML IJ SOLN
2.0000 mg | Freq: Once | INTRAMUSCULAR | Status: AC
  Administered 2017-03-31: 2 mg via INTRAVENOUS
  Filled 2017-03-31: qty 1

## 2017-03-31 MED ORDER — PROMETHAZINE HCL 25 MG/ML IJ SOLN
12.5000 mg | Freq: Four times a day (QID) | INTRAMUSCULAR | Status: DC | PRN
  Administered 2017-04-03 – 2017-04-06 (×2): 25 mg via INTRAVENOUS
  Filled 2017-03-31 (×2): qty 1

## 2017-03-31 MED ORDER — MORPHINE SULFATE (PF) 4 MG/ML IV SOLN
INTRAVENOUS | Status: AC
Start: 2017-03-31 — End: 2017-03-31
  Filled 2017-03-31: qty 1

## 2017-03-31 MED ORDER — ACETAMINOPHEN 325 MG PO TABS
650.0000 mg | ORAL_TABLET | Freq: Four times a day (QID) | ORAL | Status: DC | PRN
  Filled 2017-03-31: qty 2

## 2017-03-31 MED ORDER — ACETAMINOPHEN 650 MG RE SUPP: 650.0000 mg | Freq: Four times a day (QID) | RECTAL | Status: DC | PRN

## 2017-03-31 MED ORDER — IOPAMIDOL (ISOVUE-300) INJECTION 61%
30.0000 mL | Freq: Once | INTRAVENOUS | Status: AC
  Administered 2017-03-31: 15 mL via ORAL

## 2017-03-31 MED ORDER — SODIUM CHLORIDE 0.9 % IV BOLUS (SEPSIS)
1000.0000 mL | Freq: Once | INTRAVENOUS | Status: AC
  Administered 2017-03-31: 1000 mL via INTRAVENOUS

## 2017-03-31 MED ORDER — ONDANSETRON HCL 4 MG/2ML IJ SOLN
4.0000 mg | Freq: Once | INTRAMUSCULAR | Status: AC
  Administered 2017-03-31: 4 mg via INTRAVENOUS
  Filled 2017-03-31: qty 2

## 2017-03-31 MED ORDER — FOLIC ACID 5 MG/ML IJ SOLN
1.0000 mg | Freq: Every day | INTRAMUSCULAR | Status: DC
  Administered 2017-04-01 – 2017-04-08 (×8): 1 mg via INTRAVENOUS
  Filled 2017-03-31 (×8): qty 0.2

## 2017-03-31 MED ORDER — LIDOCAINE-PRILOCAINE 2.5-2.5 % EX CREA
TOPICAL_CREAM | CUTANEOUS | Status: DC | PRN
Start: 2017-03-31 — End: 2017-04-08

## 2017-03-31 MED ORDER — LORAZEPAM 2 MG/ML IJ SOLN: 1.0000 mg | INTRAMUSCULAR | Status: DC | PRN

## 2017-03-31 MED ORDER — IOPAMIDOL (ISOVUE-300) INJECTION 61%
INTRAVENOUS | Status: AC
  Administered 2017-03-31: 75 mL
  Filled 2017-03-31: qty 30

## 2017-03-31 MED ORDER — ONDANSETRON HCL 4 MG PO TABS: 4.0000 mg | ORAL_TABLET | Freq: Four times a day (QID) | ORAL | Status: DC | PRN

## 2017-03-31 MED ORDER — PANTOPRAZOLE SODIUM 40 MG IV SOLR
40.0000 mg | INTRAVENOUS | Status: DC
  Administered 2017-03-31 – 2017-04-07 (×8): 40 mg via INTRAVENOUS
  Filled 2017-03-31 (×8): qty 40

## 2017-03-31 MED ORDER — HYDROMORPHONE HCL 2 MG/ML IJ SOLN
0.5000 mg | INTRAMUSCULAR | Status: DC | PRN
  Administered 2017-03-31 – 2017-04-01 (×2): 1 mg via INTRAVENOUS
  Administered 2017-04-01: 0.5 mg via INTRAVENOUS
  Administered 2017-04-02 – 2017-04-03 (×3): 1 mg via INTRAVENOUS
  Administered 2017-04-03: 0.5 mg via INTRAVENOUS
  Administered 2017-04-03 – 2017-04-04 (×4): 1 mg via INTRAVENOUS
  Administered 2017-04-04 – 2017-04-05 (×8): 0.5 mg via INTRAVENOUS
  Administered 2017-04-05 – 2017-04-06 (×2): 1 mg via INTRAVENOUS
  Administered 2017-04-06 – 2017-04-07 (×3): 0.5 mg via INTRAVENOUS
  Administered 2017-04-07: 1 mg via INTRAVENOUS
  Administered 2017-04-08: 0.5 mg via INTRAVENOUS
  Filled 2017-03-31 (×28): qty 1

## 2017-03-31 MED ORDER — ONDANSETRON HCL 4 MG/2ML IJ SOLN
8.0000 mg | Freq: Once | INTRAMUSCULAR | Status: AC
  Administered 2017-03-31: 8 mg via INTRAVENOUS

## 2017-03-31 MED ORDER — HYDROMORPHONE HCL 1 MG/ML IJ SOLN
1.0000 mg | Freq: Once | INTRAMUSCULAR | Status: AC
  Administered 2017-03-31: 1 mg via INTRAVENOUS
  Filled 2017-03-31: qty 1

## 2017-03-31 NOTE — H&P (Signed)
History and Physical    JAGER KOSKA KVQ:[id number] DOB: 11-01-1961 DOA: 03/31/2017  PCP: No PCP Per Patient   Patient coming from: Home, by way of cancer center   Chief Complaint: Nausea, vomiting, abdominal discomfort   HPI: Frank Tucker is a 56 y.o. 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 Patient underwent his fourth cycle of cisplatin/Alimta on 03/25/2017 and reports developing nausea with vomiting approximately 2 days later. Since that time, his condition has progressively worsened and he reports vomiting every 2-3 hours despite using Compazine, Reglan, and Zofran at home. Over this interval, he has noted a decreased urine output, but denies any fevers or chills. He also notes that his vomitus has become brown in color without clots, red blood, or grit. He denies melena or hematochezia and has not had a bowel movement in several days. Patient also reports an 11 pound weight loss since the onset of his symptoms and has not been eating or drinking much of anything as this worsens his complaints. There are no alleviating factors identified. He denies pain per se, but reports a generalized abdominal discomfort that is constant. He has had nausea and vomiting since starting chemotherapy, but never to this degree. He denies chest pain or palpitations and denies dyspnea or cough.  ED Course: Upon arrival to the ED, patient is found to be afebrile, saturating well on room air, tachycardic in the low 100s, and with vitals otherwise stable. Chemistry panels notable for sodium of 133, BUN 35, and serum creatinine 1.61, up from an apparent baseline of 0.8. CBC is notable for leukopenia and 1300 with ANC of 600. Hemoglobin appears stable at 11.1. CT of the abdomen and  pelvis was obtained and demonstrates a transition from dilated proximal small bowel to decompressed distal small bowel compatible with a partial SBO, likely secondary to peritoneal carcinomatosis. Patient was given a liter of normal saline, treated with Zofran and Dilaudid, and NG tube was placed. He has remained hemodynamically stable in the ED and not in any respiratory distress and will be admitted to the medical-surgical unit for ongoing evaluation and management of severe nausea and vomiting with acute kidney injury secondary to partial small bowel obstruction in the setting of peritoneal carcinomatosis.  Review of Systems:  All other systems reviewed and apart from HPI, are negative.  Past Medical History:  Diagnosis Date  . Ascites   . Mesothelioma of peritoneum ([place]) 01/08/2017  . Peritoneal carcinomatosis [facility])     Past Surgical History:  Procedure Laterality Date  . ESOPHAGOGASTRODUODENOSCOPY (EGD) WITH PROPOFOL N/A 12/29/2016   Procedure: ESOPHAGOGASTRODUODENOSCOPY (EGD) WITH PROPOFOL;  Surgeon: Doran Stabler, MD;  Location: [facility] END[facility];  Service: Gastroenterology;  Laterality: N/A;  . IR GENERIC HISTORICAL  01/20/2017   IR FLUORO GUIDE PORT INSERTION RIGHT 01/20/2017 Sandi Mariscal, MD [facility]-INTER[facility] RAD  . IR GENERIC HISTORICAL  01/20/2017   IR US GUIDE VASC ACCESS RIGHT 01/20/2017 Sandi Mariscal, MD [facility]  . PARACENTESIS  12/25/2016     reports that he has never smoked. He has never used smokeless tobacco. He reports that he does not drink alcohol or use drugs.  No Known Allergies  Family History  Problem Relation Age of Onset  . Brain cancer Mother   . CAD Father  Prior to Admission medications   Medication Sig Start Date End Date Taking? Authorizing Provider  dexamethasone (DECADRON) 4 MG tablet Take 1 tablet (4 mg total) by mouth 2 (two) times daily. For 3 days. Begin day after chemo. 03/22/17  Yes Ladell Pier, MD  famotidine (PEPCID) 20 MG tablet Take 20 mg by  mouth 2 (two) times daily.   Yes Historical Provider, MD  folic acid (FOLVITE) 1 MG tablet Take 1 tablet (1 mg total) by mouth daily. 01/12/17  Yes Owens Shark, NP  lidocaine-prilocaine (EMLA) cream Apply to portacath site 1 hour prior to use. 01/12/17  Yes Owens Shark, NP  LORazepam (ATIVAN) 1 MG tablet Take 1 tablet (1 mg total) by mouth every 6 (six) hours as needed (nausea). DO NOT DRIVE 4/69/62  Yes Ladell Pier, MD  metoCLOPramide (REGLAN) 10 MG tablet Take 1 tablet (10 mg total) by mouth 3 (three) times daily before meals. DO NOT TAKE COMPAZINE. 03/18/17  Yes Ladell Pier, MD  nystatin (MYCOSTATIN/NYSTOP) powder Apply topically 2 (two) times daily. To under arm rash 03/04/17  Yes Ladell Pier, MD  ondansetron (ZOFRAN) 8 MG tablet Take 1 tablet (8 mg total) by mouth every 8 (eight) hours as needed for nausea or vomiting. Begin 3 days after chemo. 03/26/17  Yes Ladell Pier, MD  oxyCODONE-acetaminophen (PERCOCET/ROXICET) 5-325 MG tablet Take 1 tablet by mouth every 4 (four) hours as needed for severe pain. 03/25/17  Yes Ladell Pier, MD  Probiotic Product (PROBIOTIC PO) Take 1 tablet by mouth daily.   Yes Historical Provider, MD  prochlorperazine (COMPAZINE) 10 MG tablet Take 1 tablet (10 mg total) by mouth every 6 (six) hours as needed for nausea or vomiting. Patient not taking: Reported on 03/04/2017 01/29/17   Owens Shark, NP    Physical Exam: Vitals:   03/31/17 1617 03/31/17 1730  BP: 131/87 (!) 135/93  Pulse: 84 83  Resp: 18   Temp: 98.8 F (37.1 C)   TempSrc: Oral   SpO2: 98% 96%  Weight: 88.7 kg (195 lb 8 oz)   Height: 5\' 10"  (1.778 m)       Constitutional: NAD, calm, appears uncomfortable Eyes: PERTLA, lids and conjunctivae normal ENMT: Mucous membranes are moist. Posterior pharynx clear of any exudate or lesions.   Neck: normal, supple, no masses, no thyromegaly Respiratory: clear to auscultation bilaterally, no wheezing, no crackles. Normal respiratory  effort.   Cardiovascular: S1 & S2 heard, regular rate and rhythm. No extremity edema. No significant JVD. Abdomen: Mildly distended, mild generalized tenderness without rebound pain or guarding. Rare bowel sound appreciated.  Musculoskeletal: no clubbing / cyanosis. No joint deformity upper and lower extremities.   Skin: no significant rashes, lesions, ulcers. Warm, dry, well-perfused. Poor turgor. Neurologic: CN 2-12 grossly intact. Sensation intact, DTR normal. Strength 5/5 in all 4 limbs.  Psychiatric: Alert and oriented x 3. Normal mood and affect.     Labs on Admission: I have personally reviewed following labs and imaging studies  CBC:  Recent Labs Lab 03/25/17 0815 03/31/17 1417 03/31/17 1633  WBC 5.5 1.1* 1.3*  NEUTROABS 2.6 0.6* 0.5*  HGB 9.4* 11.9* 11.1*  HCT 28.1* 34.5* 32.2*  MCV 88.6 86.9 87.5  PLT 713* 297 952   Basic Metabolic Panel:  Recent Labs Lab 03/25/17 0816 03/31/17 1417 03/31/17 1633  NA 137 135* 133*  K 4.2 4.7 4.6  CL  --   --  92*  CO2 26 28 30  GLUCOSE 82 117 99  BUN 9.3 34.1* 35*  CREATININE 0.8 1.8* 1.61*  CALCIUM 9.0 9.0 8.4*  MG 1.6  --   --    GFR: Estimated Creatinine Clearance: 58.8 mL/min (A) (by C-G formula based on SCr of 1.61 mg/dL (H)). Liver Function Tests:  Recent Labs Lab 03/25/17 0816 03/31/17 1417 03/31/17 1633  AST 9 11 20   ALT 8 12 12*  ALKPHOS 71 71 55  BILITOT <0.22 0.34 0.8  PROT 5.8* 6.2* 5.8*  ALBUMIN 2.1* 2.4* 2.4*    Recent Labs Lab 03/31/17 1633  LIPASE 11   No results for input(s): AMMONIA in the last 168 hours. Coagulation Profile: No results for input(s): INR, PROTIME in the last 168 hours. Cardiac Enzymes: No results for input(s): CKTOTAL, CKMB, CKMBINDEX, TROPONINI in the last 168 hours. BNP (last 3 results) No results for input(s): PROBNP in the last 8760 hours. HbA1C: No results for input(s): HGBA1C in the last 72 hours. CBG: No results for input(s): GLUCAP in the last 168  hours. Lipid Profile: No results for input(s): CHOL, HDL, LDLCALC, TRIG, CHOLHDL, LDLDIRECT in the last 72 hours. Thyroid Function Tests: No results for input(s): TSH, T4TOTAL, FREET4, T3FREE, THYROIDAB in the last 72 hours. Anemia Panel: No results for input(s): VITAMINB12, FOLATE, FERRITIN, TIBC, IRON, RETICCTPCT in the last 72 hours. Urine analysis:    Component Value Date/Time   COLORURINE YELLOW 12/16/2016 1209   APPEARANCEUR HAZY (A) 12/16/2016 1209   LABSPEC 1.026 12/16/2016 1209   PHURINE 5.0 12/16/2016 1209   GLUCOSEU NEGATIVE 12/16/2016 1209   HGBUR NEGATIVE 12/16/2016 1209   BILIRUBINUR NEGATIVE 12/16/2016 1209   KETONESUR 80 (A) 12/16/2016 1209   PROTEINUR 100 (A) 12/16/2016 1209   NITRITE NEGATIVE 12/16/2016 1209   LEUKOCYTESUR NEGATIVE 12/16/2016 1209   Sepsis Labs: @LABRCNTIP (procalcitonin:4,lacticidven:4) )No results found for this or any previous visit (from the past 240 hour(s)).   Radiological Exams on Admission: Ct Abdomen Pelvis W Contrast  Result Date: 03/31/2017 CLINICAL DATA:  Takeshi Teasdale a 56yo male tells [place] that he had had few episodes of emesis per day for several days now. He has had recent chemo for peritoneal cancer; and that he has lost >10 lbs. Recently. He further states he has frequent par.*comment was truncated*^68mL ISOVUE-300 IOPAMIDOL (ISOVUE-300) INJECTION 61%, 59mL ISOVUE-300 IOPAMIDOL (ISOVUE-300) INJECTION 61% EXAM: CT ABDOMEN AND PELVIS WITH CONTRAST TECHNIQUE: Multidetector CT imaging of the abdomen and pelvis was performed using the standard protocol following bolus administration of intravenous contrast. CONTRAST:  48mL ISOVUE-300 IOPAMIDOL (ISOVUE-300) INJECTION 61%, 98mL ISOVUE-300 IOPAMIDOL (ISOVUE-300) INJECTION 61% COMPARISON:  CT abdomen pelvis 01/08/2018, PET-CT 01/01/2018 FINDINGS: Lower chest: Lung bases are clear. Hepatobiliary: Moderate volume intraperitoneal free fluid surrounds the liver margin and decreased in volume from in  exam of 01/08/2017. No focal hepatic lesion. Gallbladder appears normal. Pancreas: Pancreas is normal. No ductal dilatation. No pancreatic inflammation. Spleen: Normal spleen Adrenals/urinary tract: Adrenal glands and kidneys are normal. The ureters and bladder normal. Stomach/Bowel: The stomach duodenum are normal. There is fluid within the duodenum but no distention. There is some distention of the LEFT upper quadrant proximal small bowel up to 4 cm. The dilated small bowel is fluid-filled. The more distal small bowel is collapsed measuring approximately 2 cm on average. There is no clear transition point. Moderate volume of free fluid is interspersed within the leaves of the mesentery. Again noted extensive omental thickening along the ventral omental surface and peritoneal surface. Frank nodularity within the the fluid along the  RIGHT pericolic gutter (image 47, series 2) The more distal colon is chronic collapse collapse. There is some stool in the RIGHT colon LEFT colon and sigmoid colon Vascular/Lymphatic: Abdominal aorta is normal caliber. There is no retroperitoneal or periportal lymphadenopathy. No pelvic lymphadenopathy. Reproductive: Prostate normal Other: Large volume of free fluid as described in the bowel section. Omental nodularity and peritoneal nodularity described in bowel section Musculoskeletal: No aggressive osseous lesion. IMPRESSION: 1. Transition from dilated proximal small bowel to more decompressed distal small bowel consistent with a partial small bowel obstruction or developing mechanical obstruction. Peritoneal carcinomatosis is presumably the source of the obstructive pattern. 2. Persistent omental thickening and peritoneal nodularity as well as extensive peritoneal free fluid consistent with known carcinomatosis / mesothelioma . 3. Moderate volume intraperitoneal free fluid similar to comparison exams. Electronically Signed   By: Suzy Bouchard M.D.   On: 03/31/2017 18:18    EKG:  Not performed, will obtain as appropriate.   Assessment/Plan  1. Partial SBO  - Pt presents with severe N/V and generalized abd discomfort  - CT findings suggest partial SBO, likely secondary to peritoneal carcinomatosis  - There are no peritoneal signs, pt afebrile and non-toxic in appearance  - Trial conservative management with bowel rest, NGT decompression, prn antiemetics, IVF hydration, and serial exams   2. Peritoneal mesothelioma with carcinomatosis  - Pt is followed by oncology and s/p 4th cycle of cisplatin/Alimta on 03/25/17  - Persistent omental thickening and peritoneal nodularity noted on CT, with mod vol intraperitoneal fluid similar to priors   3. Neutropenia, chemotherapy-induced - ANC 500 on admission without fever or apparent source of infection - Likely chemotherapy-induced  - Neutropenic preautions, culture and start abx if febrile, trend CBC   4. Acute kidney injury  - SCr is 1.61 on admission, up from apparent baseline of ~0.8  - Likely prerenal azotemia in setting of severe N/V with 10 lb wt-loss in last week  - Avoid nephrotoxins, continue fluid-resuscitation, and repeat chem panel in am   5. Hyponatremia  - Serum sodium 133 in setting of hypovolemia  - Continue IVF hydration and repeat chem panel in am    DVT prophylaxis: SCD's Code Status: Full  Family Communication: Discussed with patient Disposition Plan: Admit to med-surg Consults called: None Admission status: Inpatient    Vianne Bulls, MD [facility] Pager [phone]  If 7PM-7AM, please contact night-coverage [email or web address] Password [facility]  03/31/2017, 7:04 PM

## 2017-03-31 NOTE — ED Notes (Signed)
Attempted to call report to floor 

## 2017-03-31 NOTE — Assessment & Plan Note (Signed)
Patient has lost approximately 11 pounds within the past 6 days.  He states that he has had minimal to no oral intake since this past weekend.  See further notes for details.

## 2017-03-31 NOTE — ED Triage Notes (Signed)
He tells [place] that he had had few episodes of emesis per day for several days now. He has had recent chemo for peritoneal cancer; and that he has lost >10 lbs. Recently. He further states he has frequent paracenteses, the most recent of which was last week. He arrives from the cancer center with one of their nurses in no distress.

## 2017-03-31 NOTE — ED Notes (Signed)
Bed: AD52 Expected date:  Expected time:  Means of arrival:  Comments: From /cancer /center vomiting/numerous.

## 2017-03-31 NOTE — ED Notes (Signed)
Pt wants port to be accessed

## 2017-03-31 NOTE — Assessment & Plan Note (Addendum)
Patient states that he developed nausea and frequent vomiting since this past Saturday, 03/27/2017.  He states that his anti--nausea medications have not helped.  He also states he's had no bowel movement since Saturday.  He reports generalized abdominal discomfort.  He continues to suffer with malignant ascites; and last underwent a therapeutic paracentesis on 03/24/2017 in which they removed approximately 6.1 L of fluid.  Also of note-patient states that his vomitus is very dark brown but no obvious blood noted.  On exam today.  Patient appears fatigued and weak.  He is also very pale.  HEENT on exam today.  Bowel sounds are very hypoactive.  Patient does have some distended/firm abdomen; but did not appear to have any abdominal tenderness with palpation.  Also, no flank pain on exam.  Sodium was down 135 and creatinine was up to 1.8.  Patient received Zofran 8 mg IV; as well as morphine 2 mg IV.  Patient was also given some IV fluid rehydration while at the [place].  Reviewed all findings with Dr. Benay Spice; he was in agreement that patient could possibly be experiencing a bowel obstruction.  Patient will be transported to the emergency department for further evaluation and management.  Brief history.  Report were called to the emergency department charge nurse; prior to the patient being transported via wheelchair to the [facility].  Per the cancer Center nurse.  CODE STATUS: Patient should be considered a full code; since there are no advanced directives in patient's chart.

## 2017-03-31 NOTE — Telephone Encounter (Signed)
"  I have really bad nausea and vomiting and would like to talk to a nurse.  Return number [phone]."   Cycle four Cisplatin/Alimta received 03-25-2017.  Returned call.  "I've been throwing up every two to three hours since Saturday (03-27-2017).  The first few days I took the compazine.  Now I'm taking the Reglan and Zofran.  The medicines are not working.  I'm getting weak.  Haven't kept anything down.  I try to drink Gatorade and water but eventually everything comes back up as a dark brown liquid.  No coffee granules just brown liquid.  My stomach hurts.  Urinating yellow amber and it's very little.  Last BM was several days ago. I cannot drive, will have to find someone to bring me in which could take two to three hours to get there."    Will notify provider.  Nurse instructions to take Lorazepam 1 mg SL and work on transportation as he may need to come in or go to the ED.Marland Kitchen

## 2017-03-31 NOTE — Assessment & Plan Note (Signed)
Patient states that he developed nausea and frequent vomiting since this past Saturday, 03/27/2017.  He states that his anti--nausea medications have not helped.  He also states he's had no bowel movement since Saturday.  He reports generalized abdominal discomfort.  He continues to suffer with malignant ascites; and last underwent a therapeutic paracentesis on 03/24/2017 in which they removed approximately 6.1 L of fluid.  On exam today.  Patient appears fatigued and weak.  He is also very pale.  HEENT on exam today.  Bowel sounds are very hypoactive.  Patient does have some distended/firm abdomen; but did not appear to have any abdominal tenderness with palpation.  Also, no flank pain on exam.  Sodium was down 135 and creatinine was up to 1.8.  Patient received Zofran 8 mg IV; as well as morphine 2 mg IV.  Patient was also given some IV fluid rehydration while at the [place].  Reviewed all findings with Dr. Benay Spice; he was in agreement that patient could possibly be experiencing a bowel obstruction.  Patient will be transported to the emergency department for further evaluation and management.  Brief history.  Report were called to the emergency department charge nurse; prior to the patient being transported via wheelchair to the [place].  Per the cancer Center nurse.  CODE STATUS: Patient should be considered a full code; since there are no advanced directives in patient's chart.

## 2017-03-31 NOTE — ED Notes (Signed)
ADMITTING Provider at bedside. 

## 2017-03-31 NOTE — Assessment & Plan Note (Signed)
Patient received cycle 4 of his cisplatin/Alimta chemotherapy therapy regimen on 03/25/2017.  There are no follow-up appointment scheduled.  This provider will review the scheduling plan with Dr. Benay Spice.

## 2017-03-31 NOTE — ED Provider Notes (Signed)
[facility] DEPT Provider Note   CSN: [id number] Arrival date & time: 03/31/17  1615     History   Chief Complaint Chief Complaint  Patient presents with  . Emesis  . Cancer    HPI Frank Tucker is a 56 y.o. male.  The history is provided by the patient.  Abdominal Pain   This is a new problem. Episode onset: 5 days. Episode frequency: intermittent; now constant. The problem has not changed since onset.The pain is located in the epigastric region and periumbilical region. The pain is moderate. Associated symptoms include nausea and vomiting. Pertinent negatives include fever and diarrhea. Associated symptoms comments: No BM for 5 days. Still passing gas.. 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    Past Medical History:  Diagnosis Date  . Ascites   . 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 Peritoneal carcinomatosis [facility])     Patient Active Problem List   Diagnosis Date Noted  . Nausea with vomiting 03/31/2017  . Dehydration 03/31/2017  . Chemotherapy induced neutropenia ([place]) 03/31/2017  . Unintentional weight loss 03/31/2017  . Port catheter in place 01/29/2017  . 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 Peritoneal carcinomatosis ([place]) 12/16/2016  . Malignant ascites 12/16/2016    Past Surgical History:  Procedure Laterality Date  . ESOPHAGOGASTRODUODENOSCOPY (EGD) WITH PROPOFOL N/A 12/29/2016   Procedure: ESOPHAGOGASTRODUODENOSCOPY (EGD) WITH PROPOFOL;  Surgeon: Doran Stabler, MD;  Location: [facility] ENDOS[facility];  Service: Gastroenterology;  Laterality: N/A;  . IR GENERIC HISTORICAL  01/20/2017   IR FLUORO GUIDE PORT INSERTION RIGHT 01/20/2017 Sandi Mariscal, MD [facility]  . IR GENERIC HISTORICAL  01/20/2017   IR US GUIDE VASC ACCESS RIGHT 01/20/2017 Sandi Mariscal, MD [facility]  . PARACENTESIS  12/25/2016       Home Medications    Prior to Admission medications   Medication Sig Start Date End Date Taking? Authorizing  Provider  dexamethasone (DECADRON) 4 MG tablet Take 1 tablet (4 mg total) by mouth 2 (two) times daily. For 3 days. Begin day after chemo. 03/22/17  Yes Ladell Pier, MD  famotidine (PEPCID) 20 MG tablet Take 20 mg by mouth 2 (two) times daily.   Yes Historical Provider, MD  folic acid (FOLVITE) 1 MG tablet Take 1 tablet (1 mg total) by mouth daily. 01/12/17  Yes Owens Shark, NP  lidocaine-prilocaine (EMLA) cream Apply to portacath site 1 hour prior to use. 01/12/17  Yes Owens Shark, NP  LORazepam (ATIVAN) 1 MG tablet Take 1 tablet (1 mg total) by mouth every 6 (six) hours as needed (nausea). DO NOT DRIVE 06/28/61  Yes Ladell Pier, MD  metoCLOPramide (REGLAN) 10 MG tablet Take 1 tablet (10 mg total) by mouth 3 (three) times daily before meals. DO NOT TAKE COMPAZINE. 03/18/17  Yes Ladell Pier, MD  nystatin (MYCOSTATIN/NYSTOP) powder Apply topically 2 (two) times daily. To under arm rash 03/04/17  Yes Ladell Pier, MD  ondansetron (ZOFRAN) 8 MG tablet Take 1 tablet (8 mg total) by mouth every 8 (eight) hours as needed for nausea or vomiting. Begin 3 days after chemo. 03/26/17  Yes Ladell Pier, MD  oxyCODONE-acetaminophen (PERCOCET/ROXICET) 5-325 MG tablet Take 1 tablet by mouth every 4 (four) hours as needed for severe pain. 03/25/17  Yes Ladell Pier, MD  Probiotic Product (PROBIOTIC PO) Take 1 tablet by mouth daily.   Yes Historical Provider, MD  prochlorperazine (COMPAZINE) 10 MG tablet Take 1 tablet (10 mg total)  by mouth every 6 (six) hours as needed for nausea or vomiting. Patient not taking: Reported on 03/04/2017 01/29/17   Owens Shark, NP    Family History Family History  Problem Relation Age of Onset  . Brain cancer Mother   . CAD Father     Social History Social History  Substance Use Topics  . Smoking status: Never Smoker  . Smokeless tobacco: Never Used  . Alcohol use No     Allergies   Patient has no known allergies.   Review of Systems Review of Systems    Constitutional: Negative for fever.  Gastrointestinal: Positive for abdominal pain, nausea and vomiting. Negative for diarrhea.   All other systems are reviewed and are negative for acute change except as noted in the HPI   Physical Exam Updated Vital Signs BP (!) 135/93   Pulse 83   Temp 98.8 F (37.1 C) (Oral)   Resp 18   Ht 5\' 10"  (1.778 m)   Wt 195 lb 8 oz (88.7 kg)   SpO2 96%   BMI 28.05 kg/m   Physical Exam  Constitutional: He is oriented to person, place, and time. He appears well-developed and well-nourished. No distress.  HENT:  Head: Normocephalic and atraumatic.  Nose: Nose normal.  Eyes: Conjunctivae and EOM are normal. Pupils are equal, round, and reactive to light. Right eye exhibits no discharge. Left eye exhibits no discharge. No scleral icterus.  Neck: Normal range of motion. Neck supple.  Cardiovascular: Normal rate and regular rhythm.  Exam reveals no gallop and no friction rub.   No murmur heard. Pulmonary/Chest: Effort normal and breath sounds normal. No stridor. No respiratory distress. He has no rales.  Abdominal: Soft. He exhibits distension. There is generalized tenderness (mild discomfort).  Musculoskeletal: He exhibits no edema or tenderness.  Neurological: He is alert and oriented to person, place, and time.  Skin: Skin is warm and dry. No rash noted. He is not diaphoretic. No erythema.  Psychiatric: He has a normal mood and affect.  Vitals reviewed.    ED Treatments / Results  Labs (all labs ordered are listed, but only abnormal results are displayed) Labs Reviewed  CBC WITH DIFFERENTIAL/PLATELET - Abnormal; Notable for the following:       Result Value   WBC 1.3 (*)    RBC 3.68 (*)    Hemoglobin 11.1 (*)    HCT 32.2 (*)    RDW 16.5 (*)    All other components within normal limits  COMPREHENSIVE METABOLIC PANEL - Abnormal; Notable for the following:    Sodium 133 (*)    Chloride 92 (*)    BUN 35 (*)    Creatinine, Ser 1.61 (*)     Calcium 8.4 (*)    Total Protein 5.8 (*)    Albumin 2.4 (*)    ALT 12 (*)    GFR calc non Af Amer 47 (*)    GFR calc Af Amer 54 (*)    All other components within normal limits  LIPASE, BLOOD    EKG  EKG Interpretation None       Radiology Ct Abdomen Pelvis W Contrast  Result Date: 03/31/2017 CLINICAL DATA:  Codee Tutson a 55yo male tells [place] that he had had few episodes of emesis per day for several days now. He has had recent chemo for peritoneal cancer; and that he has lost >10 lbs. Recently. He further states he has frequent par.*comment was truncated*^50mL ISOVUE-300 IOPAMIDOL (ISOVUE-300) INJECTION 61%, 16mL  ISOVUE-300 IOPAMIDOL (ISOVUE-300) INJECTION 61% EXAM: CT ABDOMEN AND PELVIS WITH CONTRAST TECHNIQUE: Multidetector CT imaging of the abdomen and pelvis was performed using the standard protocol following bolus administration of intravenous contrast. CONTRAST:  12mL ISOVUE-300 IOPAMIDOL (ISOVUE-300) INJECTION 61%, 79mL ISOVUE-300 IOPAMIDOL (ISOVUE-300) INJECTION 61% COMPARISON:  CT abdomen pelvis 01/08/2018, PET-CT 01/01/2018 FINDINGS: Lower chest: Lung bases are clear. Hepatobiliary: Moderate volume intraperitoneal free fluid surrounds the liver margin and decreased in volume from in exam of 01/08/2017. No focal hepatic lesion. Gallbladder appears normal. Pancreas: Pancreas is normal. No ductal dilatation. No pancreatic inflammation. Spleen: Normal spleen Adrenals/urinary tract: Adrenal glands and kidneys are normal. The ureters and bladder normal. Stomach/Bowel: The stomach duodenum are normal. There is fluid within the duodenum but no distention. There is some distention of the LEFT upper quadrant proximal small bowel up to 4 cm. The dilated small bowel is fluid-filled. The more distal small bowel is collapsed measuring approximately 2 cm on average. There is no clear transition point. Moderate volume of free fluid is interspersed within the leaves of the mesentery. Again noted  extensive omental thickening along the ventral omental surface and peritoneal surface. Frank nodularity within the the fluid along the RIGHT pericolic gutter (image 47, series 2) The more distal colon is chronic collapse collapse. There is some stool in the RIGHT colon LEFT colon and sigmoid colon Vascular/Lymphatic: Abdominal aorta is normal caliber. There is no retroperitoneal or periportal lymphadenopathy. No pelvic lymphadenopathy. Reproductive: Prostate normal Other: Large volume of free fluid as described in the bowel section. Omental nodularity and peritoneal nodularity described in bowel section Musculoskeletal: No aggressive osseous lesion. IMPRESSION: 1. Transition from dilated proximal small bowel to more decompressed distal small bowel consistent with a partial small bowel obstruction or developing mechanical obstruction. Peritoneal carcinomatosis is presumably the source of the obstructive pattern. 2. Persistent omental thickening and peritoneal nodularity as well as extensive peritoneal free fluid consistent with known carcinomatosis / mesothelioma . 3. Moderate volume intraperitoneal free fluid similar to comparison exams. Electronically Signed   By: Suzy Bouchard M.D.   On: 03/31/2017 18:18    Procedures Procedures (including critical care time)  Medications Ordered in ED Medications  iopamidol (ISOVUE-300) 61 % injection (not administered)  sodium chloride 0.9 % bolus 1,000 mL (1,000 mLs Intravenous New Bag/Given 03/31/17 1719)  ondansetron (ZOFRAN) injection 4 mg (4 mg Intravenous Given 03/31/17 1719)  HYDROmorphone (DILAUDID) injection 1 mg (1 mg Intravenous Given 03/31/17 1727)  iopamidol (ISOVUE-300) 61 % injection (75 mLs  Contrast Given 03/31/17 1748)  iopamidol (ISOVUE-300) 61 % injection 30 mL (15 mLs Oral Contrast Given 03/31/17 1640)     Initial Impression / Assessment and Plan / ED Course  I have reviewed the triage vital signs and the nursing notes.  Pertinent labs &  imaging results that were available during my care of the patient were reviewed by me and considered in my medical decision making (see chart for details).     Workup confirming partial small bowel obstruction on CT scan. NGT place. We'll admit to hospitalist service for further management.  Final Clinical Impressions(s) / ED Diagnoses   Final diagnoses:  Small bowel obstruction      Fatima Blank, MD 03/31/17 [phone]

## 2017-03-31 NOTE — Assessment & Plan Note (Signed)
Blood counts obtained today revealed a VBAC 1.1 and ANC down to 0.6.  However, patient is afebrile today.  See further notes for details of today's visit.  Reviewed all neutropenia guidelines with the patient today.  Patient will be transported to the emergency department for further evaluation and management today.

## 2017-03-31 NOTE — ED Notes (Signed)
Patient transported to CT 

## 2017-03-31 NOTE — Telephone Encounter (Signed)
Left message for patient re [place] today at 1:30 pm.

## 2017-03-31 NOTE — Progress Notes (Signed)
 SYMPTOM MANAGEMENT CLINIC    Chief Complaint: Nausea, vomiting, dehydration  HPI:  Frank Tucker 56 y.o. male diagnosed with mesothelioma of the peritoneum.  Currently undergoing cisplatin/Alimta chemotherapy therapy regimen.    No history exists.    Review of Systems  Constitutional: Positive for malaise/fatigue and weight loss.  Gastrointestinal: Positive for abdominal pain, constipation, nausea and vomiting.  Neurological: Positive for weakness.  All other systems reviewed and are negative.   Past Medical History:  Diagnosis Date  . Ascites   . Mesothelioma of peritoneum ([facility]) 01/08/2017  . Peritoneal carcinomatosis ([facility])     Past Surgical History:  Procedure Laterality Date  . ESOPHAGOGASTRODUODENOSCOPY (EGD) WITH PROPOFOL N/A 12/29/2016   Procedure: ESOPHAGOGASTRODUODENOSCOPY (EGD) WITH PROPOFOL;  Surgeon: Henry L Danis III, MD;  Location: [facility];  Service: Gastroenterology;  Laterality: N/A;  . IR GENERIC HISTORICAL  01/20/2017   IR FLUORO GUIDE PORT INSERTION RIGHT 01/20/2017 John Watts, MD [facility]  . IR GENERIC HISTORICAL  01/20/2017   IR US GUIDE VASC ACCESS RIGHT 01/20/2017 John Watts, MD [facility]  . PARACENTESIS  12/25/2016    has Peritoneal carcinomatosis ([facility]); Malignant ascites; Mesothelioma of peritoneum ([facility]); Port catheter in place; Nausea with vomiting; Dehydration; Chemotherapy induced neutropenia ([facility]); and Unintentional weight loss on his problem list.    has No Known Allergies.  Allergies as of 03/31/2017   No Known Allergies     Medication List       Accurate as of 03/31/17  5:15 PM. Always use your most recent med list.          dexamethasone 4 MG tablet Commonly known as:  DECADRON Take 1 tablet (4 mg total) by mouth 2 (two) times daily. For 3 days. Begin day after chemo.   famotidine 20 MG tablet Commonly known as:  PEPCID Take 20 mg by mouth 2 (two) times daily.   folic acid 1 MG tablet Commonly known as:   FOLVITE Take 1 tablet (1 mg total) by mouth daily.   lidocaine-prilocaine cream Commonly known as:  EMLA Apply to portacath site 1 hour prior to use.   LORazepam 1 MG tablet Commonly known as:  ATIVAN Take 1 tablet (1 mg total) by mouth every 6 (six) hours as needed (nausea). DO NOT DRIVE   metoCLOPramide 10 MG tablet Commonly known as:  REGLAN Take 1 tablet (10 mg total) by mouth 3 (three) times daily before meals. DO NOT TAKE COMPAZINE.   nystatin powder Commonly known as:  MYCOSTATIN/NYSTOP Apply topically 2 (two) times daily. To under arm rash   ondansetron 8 MG tablet Commonly known as:  ZOFRAN Take 1 tablet (8 mg total) by mouth every 8 (eight) hours as needed for nausea or vomiting. Begin 3 days after chemo.   oxyCODONE-acetaminophen 5-325 MG tablet Commonly known as:  PERCOCET/ROXICET Take 1 tablet by mouth every 4 (four) hours as needed for severe pain.   PROBIOTIC PO Take 1 tablet by mouth daily.   prochlorperazine 10 MG tablet Commonly known as:  COMPAZINE Take 1 tablet (10 mg total) by mouth every 6 (six) hours as needed for nausea or vomiting.        PHYSICAL EXAMINATION  Oncology Vitals 03/31/2017 03/31/2017  Height 178 cm 178 cm  Weight 88.678 kg 88.678 kg  Weight (lbs) 195 lbs 8 oz 195 lbs 8 oz  BMI (kg/m2) 28.05 kg/m2 28.05 kg/m2  Temp 98.8 98.3  Pulse 84 104  Resp 18 18  SpO2 98 99    BSA (m2) 2.09 m2 2.09 m2   BP Readings from Last 2 Encounters:  03/31/17 131/87  03/31/17 114/86    Physical Exam  Constitutional: He is oriented to person, place, and time. He appears dehydrated. He appears unhealthy. He has a sickly appearance.  HENT:  Head: Normocephalic and atraumatic.  Mouth/Throat: Oropharynx is clear and moist.  Eyes: Conjunctivae and EOM are normal. Pupils are equal, round, and reactive to light. Right eye exhibits no discharge. Left eye exhibits no discharge. No scleral icterus.  Neck: Normal range of motion. Neck supple. No JVD  present. No tracheal deviation present. No thyromegaly present.  Cardiovascular: Normal rate, regular rhythm, normal heart sounds and intact distal pulses.   Pulmonary/Chest: Effort normal and breath sounds normal. No respiratory distress. He has no wheezes. He has no rales. He exhibits no tenderness.  Abdominal: Soft. Bowel sounds are normal. He exhibits distension. He exhibits no mass. There is no tenderness. There is no rebound and no guarding.  Musculoskeletal: Normal range of motion. He exhibits no edema, tenderness or deformity.  Lymphadenopathy:    He has no cervical adenopathy.  Neurological: He is alert and oriented to person, place, and time.  Skin: Skin is warm and dry. No rash noted. No erythema. There is pallor.  Psychiatric: Affect normal.  Nursing note and vitals reviewed.   LABORATORY DATA:. Appointment on 03/31/2017  Component Date Value Ref Range Status  . WBC 03/31/2017 1.1* 4.0 - 10.3 10e3/uL Final  . NEUT# 03/31/2017 0.6* 1.5 - 6.5 10e3/uL Final  . HGB 03/31/2017 11.9* 13.0 - 17.1 g/dL Final  . HCT 03/31/2017 34.5* 38.4 - 49.9 % Final  . Platelets 03/31/2017 297  140 - 400 10e3/uL Final  . MCV 03/31/2017 86.9  79.3 - 98.0 fL Final  . MCH 03/31/2017 30.0  27.2 - 33.4 pg Final  . MCHC 03/31/2017 34.5  32.0 - 36.0 g/dL Final  . RBC 03/31/2017 3.97* 4.20 - 5.82 10e6/uL Final  . RDW 03/31/2017 16.5* 11.0 - 14.6 % Final  . lymph# 03/31/2017 0.5* 0.9 - 3.3 10e3/uL Final  . MONO# 03/31/2017 0.1  0.1 - 0.9 10e3/uL Final  . Eosinophils Absolute 03/31/2017 0.0  0.0 - 0.5 10e3/uL Final  . Basophils Absolute 03/31/2017 0.0  0.0 - 0.1 10e3/uL Final  . NEUT% 03/31/2017 49.6  39.0 - 75.0 % Final  . LYMPH% 03/31/2017 40.7  14.0 - 49.0 % Final  . MONO% 03/31/2017 9.7  0.0 - 14.0 % Final  . EOS% 03/31/2017 0.0  0.0 - 7.0 % Final  . BASO% 03/31/2017 0.0  0.0 - 2.0 % Final  . nRBC 03/31/2017 0  0 - 0 % Final  . Sodium 03/31/2017 135* 136 - 145 mEq/L Final  . Potassium 03/31/2017  4.7  3.5 - 5.1 mEq/L Final  . Chloride 03/31/2017 92* 98 - 109 mEq/L Final  . CO2 03/31/2017 28  22 - 29 mEq/L Final  . Glucose 03/31/2017 117  70 - 140 mg/dl Final  . BUN 03/31/2017 34.1* 7.0 - 26.0 mg/dL Final  . Creatinine 03/31/2017 1.8* 0.7 - 1.3 mg/dL Final  . Total Bilirubin 03/31/2017 0.34  0.20 - 1.20 mg/dL Final  . Alkaline Phosphatase 03/31/2017 71  40 - 150 U/L Final  . AST 03/31/2017 11  5 - 34 U/L Final  . ALT 03/31/2017 12  0 - 55 U/L Final  . Total Protein 03/31/2017 6.2* 6.4 - 8.3 g/dL Final  . Albumin 03/31/2017 2.4* 3.5 - 5.0 g/dL Final  .   Calcium 03/31/2017 9.0  8.4 - 10.4 mg/dL Final  . Anion Gap 03/31/2017 15* 3 - 11 mEq/L Final  . EGFR 03/31/2017 43* >90 ml/min/1.73 m2 Final    RADIOGRAPHIC STUDIES: No results found.  ASSESSMENT/PLAN:    Unintentional weight loss Patient has lost approximately 11 pounds within the past 6 days.  He states that he has had minimal to no oral intake since this past weekend.  See further notes for details.  Nausea with vomiting Patient states that he developed nausea and frequent vomiting since this past Saturday, 03/27/2017.  He states that his anti--nausea medications have not helped.  He also states he's had no bowel movement since Saturday.  He reports generalized abdominal discomfort.  He continues to suffer with malignant ascites; and last underwent a therapeutic paracentesis on 03/24/2017 in which they removed approximately 6.1 L of fluid.  Also of note-patient states that his vomitus is very dark brown but no obvious blood noted.  On exam today.  Patient appears fatigued and weak.  He is also very pale.  HEENT on exam today.  Bowel sounds are very hypoactive.  Patient does have some distended/firm abdomen; but did not appear to have any abdominal tenderness with palpation.  Also, no flank pain on exam.  Sodium was down 135 and creatinine was up to 1.8.  Patient received Zofran 8 mg IV; as well as morphine 2 mg IV.  Patient was  also given some IV fluid rehydration while at the cancer Center.  Reviewed all findings with Dr. Sherrill; he was in agreement that patient could possibly be experiencing a bowel obstruction.  Patient will be transported to the emergency department for further evaluation and management.  Brief history.  Report were called to the emergency department charge nurse; prior to the patient being transported via wheelchair to the cancer Center.  Per the cancer Center nurse.  CODE STATUS: Patient should be considered a full code; since there are no advanced directives in patient's chart.    Mesothelioma of peritoneum ([facility]) Patient received cycle 4 of his cisplatin/Alimta chemotherapy therapy regimen on 03/25/2017.  There are no follow-up appointment scheduled.  This provider will review the scheduling plan with Dr. Sherrill.  Dehydration Patient states that he developed nausea and frequent vomiting since this past Saturday, 03/27/2017.  He states that his anti--nausea medications have not helped.  He also states he's had no bowel movement since Saturday.  He reports generalized abdominal discomfort.  He continues to suffer with malignant ascites; and last underwent a therapeutic paracentesis on 03/24/2017 in which they removed approximately 6.1 L of fluid.  On exam today.  Patient appears fatigued and weak.  He is also very pale.  HEENT on exam today.  Bowel sounds are very hypoactive.  Patient does have some distended/firm abdomen; but did not appear to have any abdominal tenderness with palpation.  Also, no flank pain on exam.  Sodium was down 135 and creatinine was up to 1.8.  Patient received Zofran 8 mg IV; as well as morphine 2 mg IV.  Patient was also given some IV fluid rehydration while at the cancer Center.  Reviewed all findings with Dr. Sherrill; he was in agreement that patient could possibly be experiencing a bowel obstruction.  Patient will be transported to the emergency department for  further evaluation and management.  Brief history.  Report were called to the emergency department charge nurse; prior to the patient being transported via wheelchair to the cancer Center.  Per the   cancer Center nurse.  CODE STATUS: Patient should be considered a full code; since there are no advanced directives in patient's chart.  Chemotherapy induced neutropenia ([facility]) Blood counts obtained today revealed a VBAC 1.1 and ANC down to 0.6.  However, patient is afebrile today.  See further notes for details of today's visit.  Reviewed all neutropenia guidelines with the patient today.  Patient will be transported to the emergency department for further evaluation and management today.   Patient stated understanding of all instructions; and was in agreement with this plan of care. The patient knows to call the clinic with any problems, questions or concerns.   Total time spent with patient was 40 minutes;  with greater than 75 percent of that time spent in face to face counseling regarding patient's symptoms,  and coordination of care and follow up.  Disclaimer:This dictation was prepared with Dragon/digital dictation along with Smartphrase technology. Any transcriptional errors that result from this process are unintentional.  , , NP 03/31/2017   

## 2017-03-31 NOTE — Progress Notes (Signed)
Patient arrived via stretcher from ED with staff. Patient is currently on RA and is able to make his needs known. He denies any CP, HA or respiratory distress at this time. Full head to toe assessment to follow.

## 2017-03-31 NOTE — Telephone Encounter (Signed)
Patient instructed to come in for lab and [facility]  "I can get there by 1:00 pm.  Scheduling message sent.  With this call patient says he "has not taken any steroids but thinks he has some on hand.  I took the lorazepam and it may be helping a little."  Encouraged to have a trash lined box in car en route.

## 2017-03-31 NOTE — Progress Notes (Signed)
Pt transferred to ED room #12 vai w/c per Melia, RN. For possible bowel obstruction. Transported via Melia, RN  Port has been accessed with power lok in the event that pt needs scans. Dressing with biopatch and sorbaview dressing.  Port saline flushed.

## 2017-03-31 NOTE — ED Notes (Signed)
ED Provider at bedside. 

## 2017-04-01 ENCOUNTER — Inpatient Hospital Stay (HOSPITAL_COMMUNITY): Payer: PRIVATE HEALTH INSURANCE

## 2017-04-01 DIAGNOSIS — C451 Mesothelioma of peritoneum: Secondary | ICD-10-CM

## 2017-04-01 DIAGNOSIS — B372 Candidiasis of skin and nail: Secondary | ICD-10-CM

## 2017-04-01 DIAGNOSIS — E860 Dehydration: Secondary | ICD-10-CM

## 2017-04-01 DIAGNOSIS — R188 Other ascites: Secondary | ICD-10-CM

## 2017-04-01 DIAGNOSIS — R944 Abnormal results of kidney function studies: Secondary | ICD-10-CM

## 2017-04-01 DIAGNOSIS — K56609 Unspecified intestinal obstruction, unspecified as to partial versus complete obstruction: Secondary | ICD-10-CM

## 2017-04-01 DIAGNOSIS — R180 Malignant ascites: Secondary | ICD-10-CM

## 2017-04-01 DIAGNOSIS — Z4659 Encounter for fitting and adjustment of other gastrointestinal appliance and device: Secondary | ICD-10-CM

## 2017-04-01 DIAGNOSIS — D701 Agranulocytosis secondary to cancer chemotherapy: Secondary | ICD-10-CM

## 2017-04-01 LAB — BASIC METABOLIC PANEL
Anion gap: 8 (ref 5–15)
BUN: 32 mg/dL — ABNORMAL HIGH (ref 6–20)
CHLORIDE: 97 mmol/L — AB (ref 101–111)
CO2: 30 mmol/L (ref 22–32)
Calcium: 7.6 mg/dL — ABNORMAL LOW (ref 8.9–10.3)
Creatinine, Ser: 1.31 mg/dL — ABNORMAL HIGH (ref 0.61–1.24)
GFR calc non Af Amer: >60 mL/min (ref >60)
Glucose, Bld: 90 mg/dL (ref 65–99)
POTASSIUM: 3.7 mmol/L (ref 3.5–5.1)
SODIUM: 135 mmol/L (ref 135–145)

## 2017-04-01 LAB — CBC WITH DIFFERENTIAL/PLATELET
BASOS ABS: 0 10*3/uL (ref 0.0–0.1)
BASOS PCT: 0 %
EOS ABS: 0 10*3/uL (ref 0.0–0.7)
Eosinophils Relative: 0 %
HEMATOCRIT: 28.1 % — AB (ref 39.0–52.0)
Hemoglobin: 9.6 g/dL — ABNORMAL LOW (ref 13.0–17.0)
LYMPHS PCT: 45 %
Lymphs Abs: 0.7 10*3/uL (ref 0.7–4.0)
MCH: 30.2 pg (ref 26.0–34.0)
MCHC: 34.2 g/dL (ref 30.0–36.0)
MCV: 88.4 fL (ref 78.0–100.0)
MONOS PCT: 16 %
Monocytes Absolute: 0.3 10*3/uL (ref 0.1–1.0)
NEUTROS PCT: 39 %
Neutro Abs: 0.6 10*3/uL — ABNORMAL LOW (ref 1.7–7.7)
Platelets: 210 10*3/uL (ref 150–400)
RBC: 3.18 MIL/uL — ABNORMAL LOW (ref 4.22–5.81)
RDW: 16.5 % — ABNORMAL HIGH (ref 11.5–15.5)
WBC: 1.6 10*3/uL — ABNORMAL LOW (ref 4.0–10.5)

## 2017-04-01 LAB — PROTIME-INR
INR: 0.91
PROTHROMBIN TIME: 12.2 s (ref 11.4–15.2)

## 2017-04-01 LAB — HIV ANTIBODY (ROUTINE TESTING W REFLEX): HIV Screen 4th Generation wRfx: NONREACTIVE

## 2017-04-01 LAB — GLUCOSE, CAPILLARY: Glucose-Capillary: 84 mg/dL (ref 65–99)

## 2017-04-01 MED ORDER — POTASSIUM CHLORIDE 2 MEQ/ML IV SOLN
INTRAVENOUS | Status: DC
  Administered 2017-04-01 – 2017-04-04 (×8): via INTRAVENOUS
  Filled 2017-04-01 (×10): qty 1000

## 2017-04-01 NOTE — Progress Notes (Signed)
PROGRESS NOTE  Frank PRESLEY Sr.  FEO:[id number] DOB: [birth year] DOA: 03/31/2017 PCP: No PCP Per Patient Outpatient Specialists:  Subjective: Patient is passing gas this morning, denies any bowel movement since Saturday. Continue conservative management with NPO, NG tube for decompression.  Brief Narrative:  Frank Tucker is a 56 y.o. 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 Patient underwent his fourth cycle of cisplatin/Alimta on 03/25/2017 and reports developing nausea with vomiting approximately 2 days later. Since that time, his condition has progressively worsened and he reports vomiting every 2-3 hours despite using Compazine, Reglan, and Zofran at home. Over this interval, he has noted a decreased urine output, but denies any fevers or chills. He also notes that his vomitus has become brown in color without clots, red blood, or grit. He denies melena or hematochezia and has not had a bowel movement in several days. Patient also reports an 11 pound weight loss since the onset of his symptoms and has not been eating or drinking much of anything as this worsens his complaints. There are no alleviating factors identified. He denies pain per se, but reports a generalized abdominal discomfort that is constant. He has had nausea and vomiting since starting chemotherapy, but never to this degree. He denies chest pain or palpitations and denies dyspnea or cough.  Assessment & Plan:   Principal Problem:   Small bowel obstruction, partial Active Problems:   Peritoneal carcinomatosis (HCC)   Malignant ascites   Nausea with vomiting   Dehydration   Chemotherapy induced neutropenia (HCC)   Hyponatremia   AKI (acute kidney injury) ([place])   Partial small bowel  obstruction   Partial SBO  - Pt presents with severe N/V and generalized abd discomfort  - CT findings suggest partial SBO, likely secondary to peritoneal carcinomatosis  - There are no peritoneal signs, pt afebrile and non-toxic in appearance  - Trial conservative management with bowel rest, NGT decompression and when necessary antiemetics. -Aggressive ambulation, ambulate around the nursing station every 2 hours. Keep potassium above 4.  Peritoneal mesothelioma with carcinomatosis  - Pt is followed by oncology and s/p 4th cycle of cisplatin/Alimta on 03/25/17  - Persistent omental thickening and peritoneal nodularity noted on CT, with mod vol intraperitoneal fluid similar to priors   Neutropenia, chemotherapy-induced - ANC 500 on admission without fever or apparent source of infection, ANC is 0.6 today. - Likely chemotherapy-induced  - Neutropenic precautions, culture and start abx if febrile, trend CBC   Acute kidney injury  - SCr is 1.61 on admission, up from apparent baseline of ~0.8  - Scenario with N/V and BUN/creatinine ratio suggesting prerenal azotemia and dehydration. - Continue IV fluid hydration creatinine down to 1.3.   Hyponatremia  - Serum sodium 133 in setting of hypovolemia  - This is resolved after IV fluid hydration.    DVT prophylaxis:  Code Status: Full Code Family Communication:  Disposition Plan:  Diet: Diet NPO time specified Except for: Ice Chips  Consultants:   None  Procedures:   None  Antimicrobials:   None   Objective: Vitals:   03/31/17 1617 03/31/17 1730 03/31/17 2050 04/01/17 0544  BP: 131/87 (!) 135/93 (!) 129/97 (!) 127/95  Pulse: 84 83 73 67  Resp: 18  16 15   Temp: 98.8 F (37.1 C)  98.9 F (37.2 C) 97.8 F (36.6 C)  TempSrc: Oral  Oral Oral  SpO2: 98% 96% 97% 96%  Weight: 88.7 kg (195 lb 8 oz)  88.8 kg (195 lb 12.3 oz) 87.2 kg (192 lb 3.9 oz)  Height: 5\' 10"  (1.778 m)  5\' 10"  (1.778 m)     Intake/Output Summary  (Last 24 hours) at 04/01/17 1120 Last data filed at 03/31/17 2258  Gross per 24 hour  Intake           464.58 ml  Output                0 ml  Net           464.58 ml   Filed Weights   03/31/17 1617 03/31/17 2050 04/01/17 0544  Weight: 88.7 kg (195 lb 8 oz) 88.8 kg (195 lb 12.3 oz) 87.2 kg (192 lb 3.9 oz)    Examination: General exam: Appears calm and comfortable  Respiratory system: Clear to auscultation. Respiratory effort normal. Cardiovascular system: S1 & S2 heard, RRR. No JVD, murmurs, rubs, gallops or clicks. No pedal edema. Gastrointestinal system: Abdomen is nondistended, soft and nontender. No organomegaly or masses felt. Normal bowel sounds heard. Central nervous system: Alert and oriented. No focal neurological deficits. Extremities: Symmetric 5 x 5 power. Skin: No rashes, lesions or ulcers Psychiatry: Judgement and insight appear normal. Mood & affect appropriate.   Data Reviewed: I have personally reviewed following labs and imaging studies  CBC:  Recent Labs Lab 03/31/17 1417 03/31/17 1633 04/01/17 0530  WBC 1.1* 1.3* 1.6*  NEUTROABS 0.6* 0.5* 0.6*  HGB 11.9* 11.1* 9.6*  HCT 34.5* 32.2* 28.1*  MCV 86.9 87.5 88.4  PLT 297 313 245   Basic Metabolic Panel:  Recent Labs Lab 03/31/17 1417 03/31/17 1633 04/01/17 0530  NA 135* 133* 135  K 4.7 4.6 3.7  CL  --  92* 97*  CO2 28 30 30   GLUCOSE 117 99 90  BUN 34.1* 35* 32*  CREATININE 1.8* 1.61* 1.31*  CALCIUM 9.0 8.4* 7.6*   GFR: Estimated Creatinine Clearance: 66.6 mL/min (A) (by C-G formula based on SCr of 1.31 mg/dL (H)). Liver Function Tests:  Recent Labs Lab 03/31/17 1417 03/31/17 1633  AST 11 20  ALT 12 12*  ALKPHOS 71 55  BILITOT 0.34 0.8  PROT 6.2* 5.8*  ALBUMIN 2.4* 2.4*    Recent Labs Lab 03/31/17 1633  LIPASE 11   No results for input(s): AMMONIA in the last 168 hours. Coagulation Profile:  Recent Labs Lab 04/01/17 0530  INR 0.91   Cardiac Enzymes: No results for  input(s): CKTOTAL, CKMB, CKMBINDEX, TROPONINI in the last 168 hours. BNP (last 3 results) No results for input(s): PROBNP in the last 8760 hours. HbA1C: No results for input(s): HGBA1C in the last 72 hours. CBG:  Recent Labs Lab 04/01/17 0748  GLUCAP 84   Lipid Profile: No results for input(s): CHOL, HDL, LDLCALC, TRIG, CHOLHDL, LDLDIRECT in the last 72 hours. Thyroid Function Tests: No results for input(s): TSH, T4TOTAL, FREET4, T3FREE, THYROIDAB in the last 72 hours. Anemia Panel: No results for input(s): VITAMINB12, FOLATE, FERRITIN, TIBC, IRON, RETICCTPCT in the last 72 hours. Urine analysis:    Component Value Date/Time   COLORURINE YELLOW 12/16/2016 1209   APPEARANCEUR HAZY (A) 12/16/2016 1209   LABSPEC 1.026 12/16/2016 1209   PHURINE 5.0 12/16/2016 1209   GLUCOSEU NEGATIVE 12/16/2016 1209   HGBUR NEGATIVE 12/16/2016 [place] 12/16/2016 1209   KETONESUR  80 (A) 12/16/2016 1209   PROTEINUR 100 (A) 12/16/2016 1209   NITRITE NEGATIVE 12/16/2016 1209   LEUKOCYTESUR NEGATIVE 12/16/2016 1209   Sepsis Labs: @LABRCNTIP (procalcitonin:4,lacticidven:4)  )No results found for this or any previous visit (from the past 240 hour(s)).   Invalid input(s): PROCALCITONIN, LACTICACIDVEN   Radiology Studies: Dg Abd 1 View  Result Date: 04/01/2017 CLINICAL DATA:  Nasogastric tube placement. Generalized abdominal distention. Initial encounter. EXAM: ABDOMEN - 1 VIEW COMPARISON:  CT of the abdomen and pelvis performed 03/31/2017 FINDINGS: The patient's enteric tube is noted ending overlying the body of the stomach, with the side port about the gastroesophageal junction. This could be advanced perhaps 3 cm. Distended small bowel loops are again noted, concerning for partial small bowel obstruction. No free intra-abdominal air is seen. Mild left basilar atelectasis is noted. A right-sided chest port is noted ending about the distal SVC. IMPRESSION: 1. Enteric tube noted  ending overlying the body of the stomach, with the side port about the gastroesophageal junction. This could be advanced perhaps 3 cm. 2. Distended small bowel loops again seen, concerning for partial small bowel obstruction. No free intra-abdominal air seen. 3. Mild left basilar atelectasis noted. Electronically Signed   By: Garald Balding M.D.   On: 04/01/2017 06:04   Ct Abdomen Pelvis W Contrast  Result Date: 03/31/2017 CLINICAL DATA:  Karma Hiney a 89ME male tells [place] that he had had few episodes of emesis per day for several days now. He has had recent chemo for peritoneal cancer; and that he has lost >10 lbs. Recently. He further states he has frequent par.*comment was truncated*^62mL ISOVUE-300 IOPAMIDOL (ISOVUE-300) INJECTION 61%, 53mL ISOVUE-300 IOPAMIDOL (ISOVUE-300) INJECTION 61% EXAM: CT ABDOMEN AND PELVIS WITH CONTRAST TECHNIQUE: Multidetector CT imaging of the abdomen and pelvis was performed using the standard protocol following bolus administration of intravenous contrast. CONTRAST:  29mL ISOVUE-300 IOPAMIDOL (ISOVUE-300) INJECTION 61%, 34mL ISOVUE-300 IOPAMIDOL (ISOVUE-300) INJECTION 61% COMPARISON:  CT abdomen pelvis 01/08/2018, PET-CT 01/01/2018 FINDINGS: Lower chest: Lung bases are clear. Hepatobiliary: Moderate volume intraperitoneal free fluid surrounds the liver margin and decreased in volume from in exam of 01/08/2017. No focal hepatic lesion. Gallbladder appears normal. Pancreas: Pancreas is normal. No ductal dilatation. No pancreatic inflammation. Spleen: Normal spleen Adrenals/urinary tract: Adrenal glands and kidneys are normal. The ureters and bladder normal. Stomach/Bowel: The stomach duodenum are normal. There is fluid within the duodenum but no distention. There is some distention of the LEFT upper quadrant proximal small bowel up to 4 cm. The dilated small bowel is fluid-filled. The more distal small bowel is collapsed measuring approximately 2 cm on average. There is no clear  transition point. Moderate volume of free fluid is interspersed within the leaves of the mesentery. Again noted extensive omental thickening along the ventral omental surface and peritoneal surface. Frank nodularity within the the fluid along the RIGHT pericolic gutter (image 47, series 2) The more distal colon is chronic collapse collapse. There is some stool in the RIGHT colon LEFT colon and sigmoid colon Vascular/Lymphatic: Abdominal aorta is normal caliber. There is no retroperitoneal or periportal lymphadenopathy. No pelvic lymphadenopathy. Reproductive: Prostate normal Other: Large volume of free fluid as described in the bowel section. Omental nodularity and peritoneal nodularity described in bowel section Musculoskeletal: No aggressive osseous lesion. IMPRESSION: 1. Transition from dilated proximal small bowel to more decompressed distal small bowel consistent with a partial small bowel obstruction or developing mechanical obstruction. Peritoneal carcinomatosis is presumably the source of the obstructive pattern. 2. Persistent omental  thickening and peritoneal nodularity as well as extensive peritoneal free fluid consistent with known carcinomatosis / mesothelioma . 3. Moderate volume intraperitoneal free fluid similar to comparison exams. Electronically Signed   By: Suzy Bouchard M.D.   On: 03/31/2017 18:18   Dg Abd Portable 1v  Result Date: 04/01/2017 CLINICAL DATA:  Small bowel obstruction EXAM: PORTABLE ABDOMEN - 1 VIEW COMPARISON:  Abdominal radiograph of earlier today. FINDINGS: The nasogastric tube has been advanced since a tip is in the pyloric region of the proximal port in the mid gastric body. There remain loops of mildly distended gas-filled small bowel to the left of midline. IMPRESSION: Interval advancement of the esophagogastric tube since at the tip now lies in the region of the pylorus. Persistent small bowel obstruction. Electronically Signed   By: David  Martinique M.D.   On: 04/01/2017  07:27        Scheduled Meds: . folic acid  1 mg Intravenous Daily  . pantoprazole (PROTONIX) IV  40 mg Intravenous Q24H   Continuous Infusions: . lidocaine-prilocaine       LOS: 1 day    Time spent: 35 minutes    Britten Parady A, MD [facility] Pager [phone]  If 7PM-7AM, please contact night-coverage [email or web address] Password [facility] 04/01/2017, 11:20 AM

## 2017-04-01 NOTE — Plan of Care (Signed)
Problem: Safety: Goal: Ability to remain free from injury will improve Outcome: Progressing Patient will continue to remain safe and call for assistance when needing to get up

## 2017-04-01 NOTE — Progress Notes (Signed)
Initial Nutrition Assessment  DOCUMENTATION CODES:   Severe malnutrition in context of acute illness/injury  INTERVENTION:   Diet advancement per MD Will order nutritional supplements once diet advanced.  Will need to consider nutrition support if diet unable to be advanced by LOS day 8.  NUTRITION DIAGNOSIS:   Malnutrition related to acute illness (PSBO) as evidenced by percent weight loss, energy intake < or equal to 50% for > or equal to 5 days.  GOAL:   Patient will meet greater than or equal to 90% of their needs  MONITOR:   Diet advancement, Weight trends, Labs, I & O's  REASON FOR ASSESSMENT:   Malnutrition Screening Tool   ASSESSMENT:   56 y.o. 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 Patient underwent his fourth cycle of cisplatin/Alimta on 03/25/2017 and reports developing nausea with vomiting approximately 2 days later. Since that time, his condition has progressively worsened and he reports vomiting every 2-3 hours despite using Compazine, Reglan, and Zofran at home. Over this interval, he has noted a decreased urine output, but denies any fevers or chills. He also notes that his vomitus has become brown in color without clots, red blood, or grit. He denies melena or hematochezia and has not had a bowel movement in several days. Patient also reports an 11 pound weight loss since the onset of his symptoms and has not been eating or drinking much of anything as this worsens his complaints.   Patient in room with no family at bedside. Pt had NGT placed. Little output so far. Pt NPO for PSBO. Developed N/V 4 days ago after his last chemotherapy treatment. Has been unable to tolerate any food or drink during that time. Prior to these symptoms  pt was consuming 3 meals a day and focusing on protein with meals. States he is willing to try protein supplements once diet is advanced given increased weight loss. Pt is followed by [facility] (last seen 4/5).  Per chart, pt has lost 37 lb since 2/22 (16% wt loss x 2 months, significant for time frame). Pt with ascites, has had paracentesis in the past. States he has weakness in his legs and arms. Nutrition focused physical exam shows no sign of depletion of muscle mass or body fat.  Labs reviewed. Medications: IV Folic acid daily, IV Protonix daily, D5 and .45% NaCl w/ KCl infusion at 100 ml/hr -provides 408 kcal  Diet Order:  Diet NPO time specified Except for: Ice Chips  Skin:  Reviewed, no issues  Last BM:  4/7  Height:   Ht Readings from Last 1 Encounters:  03/31/17 5\' 10"  (1.778 m)    Weight:   Wt Readings from Last 1 Encounters:  04/01/17 192 lb 3.9 oz (87.2 kg)    Ideal Body Weight:  75.5 kg  BMI:  Body mass index is 27.58 kg/m.  Estimated Nutritional Needs:   Kcal:  9381-0175  Protein:  115-125g  Fluid:  2L/day  EDUCATION NEEDS:   No education needs identified at this time  Clayton Bibles, MS, RD, LDN Pager: [phone] After Hours Pager: [phone]

## 2017-04-01 NOTE — Progress Notes (Signed)
IP PROGRESS NOTE  Subjective:   Frank Tucker is known to me with a history of primary peritoneal mesothelioma. He completed cycle 4 cisplatin/Alimta on 03/25/2017. He reports the acute onset of nausea/vomiting on 03/27/2017. The nausea has persisted. He has not had a bowel movement for several days. He presented to the office chest or day and an abdominal x-ray was consistent with a small bowel obstruction. An NG tube was placed. He has persistent nausea. He reports passing a small amount of flatus this morning. He last underwent a therapeutic paracentesis on 03/24/2017.  Objective: Vital signs in last 24 hours: Blood pressure (!) 127/91, pulse 72, temperature 98.2 F (36.8 C), temperature source Oral, resp. rate 16, height 5\' 10"  (1.778 m), weight 192 lb 3.9 oz (87.2 kg), SpO2 96 %.  Intake/Output from previous day: 04/11 0701 - 04/12 0700 In: 464.6 [I.V.:464.6] Out: -   Physical Exam:  HEENT: No thrush Lungs: Clear bilaterally Cardiac: Regular rate and rhythm Abdomen: Mildly distended with ascites, no mass, nontender Extremities: No leg edema   Portacath/PICC-without erythema  Lab Results:  Recent Labs  03/31/17 1633 04/01/17 0530  WBC 1.3* 1.6*  HGB 11.1* 9.6*  HCT 32.2* 28.1*  PLT 313 210    BMET  Recent Labs  03/31/17 1633 04/01/17 0530  NA 133* 135  K 4.6 3.7  CL 92* 97*  CO2 30 30  GLUCOSE 99 90  BUN 35* 32*  CREATININE 1.61* 1.31*  CALCIUM 8.4* 7.6*    Studies/Results: Dg Abd 1 View  Result Date: 04/01/2017 CLINICAL DATA:  Nasogastric tube placement. Generalized abdominal distention. Initial encounter. EXAM: ABDOMEN - 1 VIEW COMPARISON:  CT of the abdomen and pelvis performed 03/31/2017 FINDINGS: The patient's enteric tube is noted ending overlying the body of the stomach, with the side port about the gastroesophageal junction. This could be advanced perhaps 3 cm. Distended small bowel loops are again noted, concerning for partial small bowel  obstruction. No free intra-abdominal air is seen. Mild left basilar atelectasis is noted. A right-sided chest port is noted ending about the distal SVC. IMPRESSION: 1. Enteric tube noted ending overlying the body of the stomach, with the side port about the gastroesophageal junction. This could be advanced perhaps 3 cm. 2. Distended small bowel loops again seen, concerning for partial small bowel obstruction. No free intra-abdominal air seen. 3. Mild left basilar atelectasis noted. Electronically Signed   By: Garald Balding M.D.   On: 04/01/2017 06:04   Ct Abdomen Pelvis W Contrast  Result Date: 03/31/2017 CLINICAL DATA:  Frank Tucker a 64OV male tells [place] that he had had few episodes of emesis per day for several days now. He has had recent chemo for peritoneal cancer; and that he has lost >10 lbs. Recently. He further states he has frequent par.*comment was truncated*^25mL ISOVUE-300 IOPAMIDOL (ISOVUE-300) INJECTION 61%, 78mL ISOVUE-300 IOPAMIDOL (ISOVUE-300) INJECTION 61% EXAM: CT ABDOMEN AND PELVIS WITH CONTRAST TECHNIQUE: Multidetector CT imaging of the abdomen and pelvis was performed using the standard protocol following bolus administration of intravenous contrast. CONTRAST:  35mL ISOVUE-300 IOPAMIDOL (ISOVUE-300) INJECTION 61%, 54mL ISOVUE-300 IOPAMIDOL (ISOVUE-300) INJECTION 61% COMPARISON:  CT abdomen pelvis 01/08/2018, PET-CT 01/01/2018 FINDINGS: Lower chest: Lung bases are clear. Hepatobiliary: Moderate volume intraperitoneal free fluid surrounds the liver margin and decreased in volume from in exam of 01/08/2017. No focal hepatic lesion. Gallbladder appears normal. Pancreas: Pancreas is normal. No ductal dilatation. No pancreatic inflammation. Spleen: Normal spleen Adrenals/urinary tract: Adrenal glands and kidneys are normal. The ureters  and bladder normal. Stomach/Bowel: The stomach duodenum are normal. There is fluid within the duodenum but no distention. There is some distention of the LEFT  upper quadrant proximal small bowel up to 4 cm. The dilated small bowel is fluid-filled. The more distal small bowel is collapsed measuring approximately 2 cm on average. There is no clear transition point. Moderate volume of free fluid is interspersed within the leaves of the mesentery. Again noted extensive omental thickening along the ventral omental surface and peritoneal surface. Frank nodularity within the the fluid along the RIGHT pericolic gutter (image 47, series 2) The more distal colon is chronic collapse collapse. There is some stool in the RIGHT colon LEFT colon and sigmoid colon Vascular/Lymphatic: Abdominal aorta is normal caliber. There is no retroperitoneal or periportal lymphadenopathy. No pelvic lymphadenopathy. Reproductive: Prostate normal Other: Large volume of free fluid as described in the bowel section. Omental nodularity and peritoneal nodularity described in bowel section Musculoskeletal: No aggressive osseous lesion. IMPRESSION: 1. Transition from dilated proximal small bowel to more decompressed distal small bowel consistent with a partial small bowel obstruction or developing mechanical obstruction. Peritoneal carcinomatosis is presumably the source of the obstructive pattern. 2. Persistent omental thickening and peritoneal nodularity as well as extensive peritoneal free fluid consistent with known carcinomatosis / mesothelioma . 3. Moderate volume intraperitoneal free fluid similar to comparison exams. Electronically Signed   By: Suzy Bouchard M.D.   On: 03/31/2017 18:18   Dg Abd Portable 1v  Result Date: 04/01/2017 CLINICAL DATA:  Small bowel obstruction EXAM: PORTABLE ABDOMEN - 1 VIEW COMPARISON:  Abdominal radiograph of earlier today. FINDINGS: The nasogastric tube has been advanced since a tip is in the pyloric region of the proximal port in the mid gastric body. There remain loops of mildly distended gas-filled small bowel to the left of midline. IMPRESSION: Interval  advancement of the esophagogastric tube since at the tip now lies in the region of the pylorus. Persistent small bowel obstruction. Electronically Signed   By: David  Martinique M.D.   On: 04/01/2017 07:27    Medications: I have reviewed the patient's current medications.  Assessment/Plan:  1. Abdominal carcinomatosis-primary peritoneal mesothelioma ? Paracentesis on 12/17/2016 confirmed malignant cells consistent with carcinoma, cytokeratin and WT-1 positive ? CTs of the chest, abdomen, and pelvis with ascites and omental caking. No other evidence of metastatic disease and no primary tumor site identified ? Upper endoscopy 12/29/2016-negative for malignancy ? PET scan 01/01/2017 with extensive hypermetabolic omental caking of tumor with extensive ascites, no primary tumor site identified ? 01/08/2017 omental mass biopsy-malignant mesothelioma ? Cycle 1 cisplatin/Alimta 01/21/2017 ? Cycle 2 cisplatin/Alimta 02/11/2017 ? Cycle 3 cisplatin/Alimta 03/04/2017 ? Cycle 4 cisplatin/Alimta 03/25/2017 ? CT 04/01/2017-persistent ascites and omental thickening/peritoneal nodularity, new small bowel obstruction   2. Yeast rash in the axilla and groin.  3. Port-A-Cath placement 01/20/2017  4. Delayed nauseasecondary to chemotherapy-persistent following cycle 2 despite Decadron prophylaxis, improved with cycles 3 with the addition of Reglan  5. Malignant ascites             6.   Elevated creatinine-likely secondary to dehydration versus toxicity from cisplatin             7.   Neutropenia secondary to chemotherapy  Mr. Szabo is admitted with a small bowel obstruction. The obstruction is likely related to abdominal carcinomatosis. An NG tube is in place.  The plan was for him to see Dr.Votanopoulos In May to consider cytoreductive surgery and intraperitoneal chemotherapy.  Recommendations:  1. Bowel rest and NG tube decompression  2.  Intravenous hydration, follow creatinine  3. Obtain cultures and begin broad-spectrum intravenous antibiotics for a fever, repeat neutrophil count within the next few days  4. Surgical consultation for management of the bowel obstruction, consider [facility] Johney Maine at [facility] if the obstructive symptoms do not improve with bowel rest   Please call Oncology as needed. I will be out until 04/05/2017.    LOS: 1 day   Betsy Coder, MD   04/01/2017, 1:40 PM

## 2017-04-01 NOTE — Progress Notes (Signed)
NG Tube placed to right nare as ordered. Awaiting placement results to start on LWS.

## 2017-04-02 ENCOUNTER — Telehealth: Payer: Self-pay | Admitting: Oncology

## 2017-04-02 LAB — BASIC METABOLIC PANEL
Anion gap: 9 (ref 5–15)
BUN: 22 mg/dL — AB (ref 6–20)
CO2: 28 mmol/L (ref 22–32)
CREATININE: 1.1 mg/dL (ref 0.61–1.24)
Calcium: 8 mg/dL — ABNORMAL LOW (ref 8.9–10.3)
Chloride: 97 mmol/L — ABNORMAL LOW (ref 101–111)
GFR calc Af Amer: >60 mL/min (ref >60)
GLUCOSE: 115 mg/dL — AB (ref 65–99)
POTASSIUM: 3.6 mmol/L (ref 3.5–5.1)
Sodium: 134 mmol/L — ABNORMAL LOW (ref 135–145)

## 2017-04-02 LAB — GLUCOSE, CAPILLARY: Glucose-Capillary: 116 mg/dL — ABNORMAL HIGH (ref 65–99)

## 2017-04-02 NOTE — Telephone Encounter (Signed)
S/w pt, gave appt for 4/26 @ 9.15am.

## 2017-04-02 NOTE — Progress Notes (Signed)
PROGRESS NOTE  [name]  ZGY:[id number] DOB: 1961-01-30 DOA: 03/31/2017 PCP: No PCP Per Patient Outpatient Specialists:  Subjective: Patient seen with his son at bedside, still passing gas but no bowel movement. Continued ambulation, NG tube with just over a liter of discharge. Will clamp the tube if he has bowel movement if not we need to consult general surgery in a.m.  Brief Narrative:  Frank Tucker is a 56 y.o. 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 Patient underwent his fourth cycle of cisplatin/Alimta on 03/25/2017 and reports developing nausea with vomiting approximately 2 days later. Since that time, his condition has progressively worsened and he reports vomiting every 2-3 hours despite using Compazine, Reglan, and Zofran at home. Over this interval, he has noted a decreased urine output, but denies any fevers or chills. He also notes that his vomitus has become brown in color without clots, red blood, or grit. He denies melena or hematochezia and has not had a bowel movement in several days. Patient also reports an 11 pound weight loss since the onset of his symptoms and has not been eating or drinking much of anything as this worsens his complaints. There are no alleviating factors identified. He denies pain per se, but reports a generalized abdominal discomfort that is constant. He has had nausea and vomiting since starting chemotherapy, but never to this degree. He denies chest pain or palpitations and denies dyspnea or cough.  Assessment & Plan:   Principal Problem:   Small bowel obstruction, partial Active Problems:   Peritoneal carcinomatosis (HCC)   Malignant ascites   Nausea with vomiting   Dehydration   Chemotherapy induced  neutropenia (HCC)   Hyponatremia   AKI (acute kidney injury) ([place])   Partial small bowel obstruction   Partial SBO  - Pt presents with severe N/V and generalized abd discomfort  - CT findings suggest partial SBO, likely secondary to peritoneal carcinomatosis  - There are no peritoneal signs, pt afebrile and non-toxic in appearance  - Trial conservative management with bowel rest, NGT decompression and when necessary antiemetics. -Aggressive ambulation, ambulate around the nursing station every 2 hours. Keep potassium above 4. -Continue to follow, if he has improvement will climb the 2 likely advanced diet. -If continues without bowel movement might need general surgery consultation in a.m.  Peritoneal mesothelioma with carcinomatosis  - Pt is followed by oncology and s/p 4th cycle of cisplatin/Alimta on 03/25/17  - Persistent omental thickening and peritoneal nodularity noted on CT, with mod vol intraperitoneal fluid similar to priors   Neutropenia, chemotherapy-induced - ANC 500 on admission without fever or apparent source of infection, ANC is 0.6 today. - Likely chemotherapy-induced  - Neutropenic precautions, check CBC in a.m.  Acute kidney injury  - SCr is 1.61 on admission, up from apparent baseline of ~0.8  - Scenario with N/V and BUN/creatinine ratio suggesting prerenal azotemia and dehydration. - This is resolved with IV fluid hydration creatinine is 1.1 today.  Hyponatremia  - Serum sodium 133 in setting of hypovolemia  - This is resolved after IV fluid hydration.    DVT prophylaxis:  Code Status: Full Code Family Communication:  Disposition Plan:  Diet: Diet NPO time specified Except for: Ice Chips  Consultants:   None  Procedures:   None  Antimicrobials:   None   Objective: Vitals:   04/01/17 2038 04/02/17 0141 04/02/17 0402 04/02/17 1436  BP: 125/87  114/78 (!) 128/91  Pulse: 80  74 99  Resp: 18  16 16   Temp: 97.8 F (36.6 C)  98 F (36.7  C) 97.7 F (36.5 C)  TempSrc: Oral  Oral Tympanic  SpO2: 95%  96% 99%  Weight:  86.8 kg (191 lb 5.8 oz)    Height:        Intake/Output Summary (Last 24 hours) at 04/02/17 1507 Last data filed at 04/02/17 1413  Gross per 24 hour  Intake          2611.66 ml  Output             1175 ml  Net          1436.66 ml   Filed Weights   03/31/17 2050 04/01/17 0544 04/02/17 0141  Weight: 88.8 kg (195 lb 12.3 oz) 87.2 kg (192 lb 3.9 oz) 86.8 kg (191 lb 5.8 oz)    Examination: General exam: Appears calm and comfortable  Respiratory system: Clear to auscultation. Respiratory effort normal. Cardiovascular system: S1 & S2 heard, RRR. No JVD, murmurs, rubs, gallops or clicks. No pedal edema. Gastrointestinal system: Abdomen is nondistended, soft and nontender. No organomegaly or masses felt. Normal bowel sounds heard. Central nervous system: Alert and oriented. No focal neurological deficits. Extremities: Symmetric 5 x 5 power. Skin: No rashes, lesions or ulcers Psychiatry: Judgement and insight appear normal. Mood & affect appropriate.   Data Reviewed: I have personally reviewed following labs and imaging studies  CBC:  Recent Labs Lab 03/31/17 1417 03/31/17 1633 04/01/17 0530  WBC 1.1* 1.3* 1.6*  NEUTROABS 0.6* 0.5* 0.6*  HGB 11.9* 11.1* 9.6*  HCT 34.5* 32.2* 28.1*  MCV 86.9 87.5 88.4  PLT 297 313 749   Basic Metabolic Panel:  Recent Labs Lab 03/31/17 1417 03/31/17 1633 04/01/17 0530 04/02/17 0800  NA 135* 133* 135 134*  K 4.7 4.6 3.7 3.6  CL  --  92* 97* 97*  CO2 28 30 30 28   GLUCOSE 117 99 90 115*  BUN 34.1* 35* 32* 22*  CREATININE 1.8* 1.61* 1.31* 1.10  CALCIUM 9.0 8.4* 7.6* 8.0*   GFR: Estimated Creatinine Clearance: 79.3 mL/min (by C-G formula based on SCr of 1.1 mg/dL). Liver Function Tests:  Recent Labs Lab 03/31/17 1417 03/31/17 1633  AST 11 20  ALT 12 12*  ALKPHOS 71 55  BILITOT 0.34 0.8  PROT 6.2* 5.8*  ALBUMIN 2.4* 2.4*    Recent Labs Lab  03/31/17 1633  LIPASE 11   No results for input(s): AMMONIA in the last 168 hours. Coagulation Profile:  Recent Labs Lab 04/01/17 0530  INR 0.91   Cardiac Enzymes: No results for input(s): CKTOTAL, CKMB, CKMBINDEX, TROPONINI in the last 168 hours. BNP (last 3 results) No results for input(s): PROBNP in the last 8760 hours. HbA1C: No results for input(s): HGBA1C in the last 72 hours. CBG:  Recent Labs Lab 04/01/17 0748 04/02/17 0803  GLUCAP 84 116*   Lipid Profile: No results for input(s): CHOL, HDL, LDLCALC, TRIG, CHOLHDL, LDLDIRECT in the last 72 hours. Thyroid Function Tests: No results for input(s): TSH, T4TOTAL, FREET4, T3FREE, THYROIDAB in the last 72 hours. Anemia Panel: No results for input(s): VITAMINB12, FOLATE, FERRITIN, TIBC, IRON, RETICCTPCT in the last 72 hours. Urine analysis:    Component Value Date/Time   COLORURINE YELLOW 12/16/2016 1209   APPEARANCEUR HAZY (A) 12/16/2016  1209   LABSPEC 1.026 12/16/2016 1209   PHURINE 5.0 12/16/2016 1209   GLUCOSEU NEGATIVE 12/16/2016 1209   HGBUR NEGATIVE 12/16/2016 1209   BILIRUBINUR NEGATIVE 12/16/2016 1209   KETONESUR 80 (A) 12/16/2016 1209   PROTEINUR 100 (A) 12/16/2016 1209   NITRITE NEGATIVE 12/16/2016 1209   LEUKOCYTESUR NEGATIVE 12/16/2016 1209   Sepsis Labs: @LABRCNTIP (procalcitonin:4,lacticidven:4)  )No results found for this or any previous visit (from the past 240 hour(s)).   Invalid input(s): PROCALCITONIN, LACTICACIDVEN   Radiology Studies: Dg Abd 1 View  Result Date: 04/01/2017 CLINICAL DATA:  Nasogastric tube placement. Generalized abdominal distention. Initial encounter. EXAM: ABDOMEN - 1 VIEW COMPARISON:  CT of the abdomen and pelvis performed 03/31/2017 FINDINGS: The patient's enteric tube is noted ending overlying the body of the stomach, with the side port about the gastroesophageal junction. This could be advanced perhaps 3 cm. Distended small bowel loops are again noted, concerning for  partial small bowel obstruction. No free intra-abdominal air is seen. Mild left basilar atelectasis is noted. A right-sided chest port is noted ending about the distal SVC. IMPRESSION: 1. Enteric tube noted ending overlying the body of the stomach, with the side port about the gastroesophageal junction. This could be advanced perhaps 3 cm. 2. Distended small bowel loops again seen, concerning for partial small bowel obstruction. No free intra-abdominal air seen. 3. Mild left basilar atelectasis noted. Electronically Signed   By: Garald Balding M.D.   On: 04/01/2017 06:04   Ct Abdomen Pelvis W Contrast  Result Date: 03/31/2017 CLINICAL DATA:  Brek Reece a 9MV male tells [place] that he had had few episodes of emesis per day for several days now. He has had recent chemo for peritoneal cancer; and that he has lost >10 lbs. Recently. He further states he has frequent par.*comment was truncated*^7mL ISOVUE-300 IOPAMIDOL (ISOVUE-300) INJECTION 61%, 25mL ISOVUE-300 IOPAMIDOL (ISOVUE-300) INJECTION 61% EXAM: CT ABDOMEN AND PELVIS WITH CONTRAST TECHNIQUE: Multidetector CT imaging of the abdomen and pelvis was performed using the standard protocol following bolus administration of intravenous contrast. CONTRAST:  40mL ISOVUE-300 IOPAMIDOL (ISOVUE-300) INJECTION 61%, 107mL ISOVUE-300 IOPAMIDOL (ISOVUE-300) INJECTION 61% COMPARISON:  CT abdomen pelvis 01/08/2018, PET-CT 01/01/2018 FINDINGS: Lower chest: Lung bases are clear. Hepatobiliary: Moderate volume intraperitoneal free fluid surrounds the liver margin and decreased in volume from in exam of 01/08/2017. No focal hepatic lesion. Gallbladder appears normal. Pancreas: Pancreas is normal. No ductal dilatation. No pancreatic inflammation. Spleen: Normal spleen Adrenals/urinary tract: Adrenal glands and kidneys are normal. The ureters and bladder normal. Stomach/Bowel: The stomach duodenum are normal. There is fluid within the duodenum but no distention. There is some  distention of the LEFT upper quadrant proximal small bowel up to 4 cm. The dilated small bowel is fluid-filled. The more distal small bowel is collapsed measuring approximately 2 cm on average. There is no clear transition point. Moderate volume of free fluid is interspersed within the leaves of the mesentery. Again noted extensive omental thickening along the ventral omental surface and peritoneal surface. Frank nodularity within the the fluid along the RIGHT pericolic gutter (image 47, series 2) The more distal colon is chronic collapse collapse. There is some stool in the RIGHT colon LEFT colon and sigmoid colon Vascular/Lymphatic: Abdominal aorta is normal caliber. There is no retroperitoneal or periportal lymphadenopathy. No pelvic lymphadenopathy. Reproductive: Prostate normal Other: Large volume of free fluid as described in the bowel section. Omental nodularity and peritoneal nodularity described in bowel section Musculoskeletal: No aggressive osseous lesion. IMPRESSION: 1. Transition from  dilated proximal small bowel to more decompressed distal small bowel consistent with a partial small bowel obstruction or developing mechanical obstruction. Peritoneal carcinomatosis is presumably the source of the obstructive pattern. 2. Persistent omental thickening and peritoneal nodularity as well as extensive peritoneal free fluid consistent with known carcinomatosis / mesothelioma . 3. Moderate volume intraperitoneal free fluid similar to comparison exams. Electronically Signed   By: Suzy Bouchard M.D.   On: 03/31/2017 18:18   Dg Abd Portable 1v  Result Date: 04/01/2017 CLINICAL DATA:  Small bowel obstruction EXAM: PORTABLE ABDOMEN - 1 VIEW COMPARISON:  Abdominal radiograph of earlier today. FINDINGS: The nasogastric tube has been advanced since a tip is in the pyloric region of the proximal port in the mid gastric body. There remain loops of mildly distended gas-filled small bowel to the left of midline.  IMPRESSION: Interval advancement of the esophagogastric tube since at the tip now lies in the region of the pylorus. Persistent small bowel obstruction. Electronically Signed   By: David  Martinique M.D.   On: 04/01/2017 07:27        Scheduled Meds: . folic acid  1 mg Intravenous Daily  . pantoprazole (PROTONIX) IV  40 mg Intravenous Q24H   Continuous Infusions: . dextrose 5 % and 0.45% NaCl 1,000 mL with potassium chloride 10 mEq infusion 100 mL/hr at 04/02/17 0745  . lidocaine-prilocaine       LOS: 2 days    Time spent: 35 minutes    Sacha Radloff A, MD [facility] Pager [phone]  If 7PM-7AM, please contact night-coverage [email or web address] Password [facility] 04/02/2017, 3:07 PM

## 2017-04-03 ENCOUNTER — Encounter (HOSPITAL_COMMUNITY): Payer: Self-pay | Admitting: Surgery

## 2017-04-03 LAB — GLUCOSE, CAPILLARY: GLUCOSE-CAPILLARY: 109 mg/dL — AB (ref 65–99)

## 2017-04-03 NOTE — Consult Note (Signed)
General Surgery [facility]  Reason for Consult: small bowel obstruction secondary to peritoneal carcinomatosis from mesothelioma  Referring Physician: Dr. Hartford Poli, [facility]; Dr. Julieanne Manson, medical oncology  Derenda Fennel Sr. is an 56 y.o. male.  HPI: Patient is a 56 year old male who initially developed symptoms of abdominal distention in December 2017. Patient presented to the emergency department and CT scan demonstrated likely intraperitoneal tumor with ascites. Patient underwent paracentesis and eventually a percutaneous needle biopsy of the omentum with diagnosis of peritoneal mesothelioma. Patient was started on adjuvant chemotherapy. He has required multiple episodes of paracentesis about every 2 weeks. Patient has had no prior abdominal surgery. Patient is now admitted on the medical service with signs and symptoms of small bowel obstruction. Review of his CT scan shows proximally dilated loops of small bowel with decompressed loops of small bowel distally without a clear transition point. Patient has intravenous hydration and has a nasogastric tube in position for decompression. He has been evaluated by the medical service and by his medical oncologist.  Patient has previously been seen at [facility] for planning of laparotomy with tumor debulking and intraperitoneal chemotherapy. This was tentatively planned for May 2018.  Past Medical History:  Diagnosis Date  . Ascites   . Mesothelioma of peritoneum ([place]) 01/08/2017  . Peritoneal carcinomatosis [facility])     Past Surgical History:  Procedure Laterality Date  . ESOPHAGOGASTRODUODENOSCOPY (EGD) WITH PROPOFOL N/A 12/29/2016   Procedure: ESOPHAGOGASTRODUODENOSCOPY (EGD) WITH PROPOFOL;  Surgeon: Doran Stabler, MD;  Location: [facility] ENDOSCOPY;  Service: Gastroenterology;  Laterality: N/A;  . IR GENERIC HISTORICAL  01/20/2017   IR FLUORO GUIDE PORT INSERTION RIGHT 01/20/2017 Sandi Mariscal,  MD [facility]-INTER[facility] RAD  . IR GENERIC HISTORICAL  01/20/2017   IR US GUIDE VASC ACCESS RIGHT 01/20/2017 Sandi Mariscal, MD [facility]  . PARACENTESIS  12/25/2016    Family History  Problem Relation Age of Onset  . Brain cancer Mother   . CAD Father     Social History:  reports that he has never smoked. He has never used smokeless tobacco. He reports that he does not drink alcohol or use drugs.  Allergies: No Known Allergies  Medications: I have reviewed the patient's current medications.  Results for orders placed or performed during the hospital encounter of 03/31/17 (from the past 48 hour(s))  Basic metabolic panel     Status: Abnormal   Collection Time: 04/02/17  8:00 AM  Result Value Ref Range   Sodium 134 (L) 135 - 145 mmol/L   Potassium 3.6 3.5 - 5.1 mmol/L   Chloride 97 (L) 101 - 111 mmol/L   CO2 28 22 - 32 mmol/L   Glucose, Bld 115 (H) 65 - 99 mg/dL   BUN 22 (H) 6 - 20 mg/dL   Creatinine, Ser 1.10 0.61 - 1.24 mg/dL   Calcium 8.0 (L) 8.9 - 10.3 mg/dL   GFR calc non Af Amer >60 >60 mL/min   GFR calc Af Amer >60 >60 mL/min    Comment: (NOTE) The eGFR has been calculated using the CKD EPI equation. This calculation has not been validated in all clinical situations. eGFR's persistently <60 mL/min signify possible Chronic Kidney Disease.    Anion gap 9 5 - 15  Glucose, capillary     Status: Abnormal   Collection Time: 04/02/17  8:03 AM  Result Value Ref Range   Glucose-Capillary 116 (H) 65 - 99 mg/dL  Glucose, capillary  Status: Abnormal   Collection Time: 04/03/17  7:27 AM  Result Value Ref Range   Glucose-Capillary 109 (H) 65 - 99 mg/dL   Comment 1 Notify RN    Comment 2 Document in Chart     No results found.  Review of Systems  HENT: Negative.   Eyes: Negative.   Respiratory: Negative.   Cardiovascular: Negative.   Gastrointestinal: Positive for nausea and vomiting.  Genitourinary: Negative.   Musculoskeletal: Negative.   Skin: Negative.   Neurological:  Negative.  Negative for weakness.  Endo/Heme/Allergies: Negative.   Psychiatric/Behavioral: Negative.    Blood pressure 123/87, pulse 81, temperature 97.7 F (36.5 C), temperature source Oral, resp. rate 18, height _0  (1.778 m), weight 84.9 kg (187 lb 2.7 oz), SpO2 99 %. Physical Exam  Constitutional: He is oriented to person, place, and time. He appears well-developed and well-nourished. No distress.  HENT:  Head: Normocephalic and atraumatic.  Right Ear: External ear normal.  Left Ear: External ear normal.  Mouth/Throat: Oropharynx is clear and moist.  Nasogastric tube in position with thin bilious output  Eyes: Conjunctivae are normal. Pupils are equal, round, and reactive to light. Left eye exhibits no discharge. No scleral icterus.  Neck: Normal range of motion. Neck supple. No tracheal deviation present. No thyromegaly present.  Cardiovascular: Normal rate, regular rhythm and normal heart sounds.   No murmur heard. Respiratory: Effort normal and breath sounds normal. No respiratory distress. He has no wheezes.  GI: Soft. Bowel sounds are normal. He exhibits distension (mild). He exhibits no mass. There is no tenderness. There is no rebound and no guarding.  No incisions  Musculoskeletal: Normal range of motion. He exhibits no edema or deformity.  Neurological: He is alert and oriented to person, place, and time.  Skin: Skin is warm and dry. He is not diaphoretic.  Psychiatric: He has a normal mood and affect. His behavior is normal.    Assessment/Plan: Malignant small bowel obstruction secondary to peritoneal mesothelioma  Continue NPO, IV hydration, NG decompression  Consider paracentesis for ascites if progressively symptomatic  Patient is currently clinically stable without complications from his small bowel obstruction. Obstruction is almost certainly due to neoplasm. Patient has no previous abdominal surgical history. I am concerned about operative intervention at this  time. At best, it would likely require small bowel resection. Otherwise he may require a small bowel bypass procedure for palliation. At worst, there would be significant carcinomatosis which would prevent any intervention for relieving the obstruction. Any surgical procedure at this time would likely delay or prevent his tumor debulking and intraperitoneal chemotherapy procedure at [facility].  I would like to continue management nonoperatively over the weekend. We will then attempt to contact his physicians at [facility] on Monday, 04/05/2017, and coordinate care with his surgical oncology team. Patient may possibly be transferred to their service for operative intervention and treatment. I will discuss this further with Dr. Julieanne Manson when he is available.  In the interim, general surgery will follow the patient during his hospital stay here at [facility].  Earnstine Regal, MD, [facility] Office: [place] 04/03/2017, 10:12 AM

## 2017-04-03 NOTE — Progress Notes (Signed)
PROGRESS NOTE  Frank DINGEE Sr.  WEX:[id number] DOB: [birth year] DOA: 03/31/2017 PCP: No PCP Per Patient Outpatient Specialists:  Subjective: Continuous Ambulate in the hallways, continues to pass gas daily but no bowel movement yet. Continue NG tube for decompression  Brief Narrative:  Frank Tucker is a 56 y.o. 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 Patient underwent his fourth cycle of cisplatin/Alimta on 03/25/2017 and reports developing nausea with vomiting approximately 2 days later. Since that time, his condition has progressively worsened and he reports vomiting every 2-3 hours despite using Compazine, Reglan, and Zofran at home. Over this interval, he has noted a decreased urine output, but denies any fevers or chills. He also notes that his vomitus has become brown in color without clots, red blood, or grit. He denies melena or hematochezia and has not had a bowel movement in several days. Patient also reports an 11 pound weight loss since the onset of his symptoms and has not been eating or drinking much of anything as this worsens his complaints. There are no alleviating factors identified. He denies pain per se, but reports a generalized abdominal discomfort that is constant. He has had nausea and vomiting since starting chemotherapy, but never to this degree. He denies chest pain or palpitations and denies dyspnea or cough.  Assessment & Plan:   Principal Problem:   Small bowel obstruction, partial (HCC) Active Problems:   Peritoneal carcinomatosis (HCC)   Malignant ascites   Nausea with vomiting   Dehydration   Chemotherapy induced neutropenia (HCC)   Hyponatremia   AKI (acute kidney injury) ([place])   Partial small bowel obstruction  (HCC)   Partial SBO  -Pt presents with severe N/V and generalized abd discomfort  -CT findings suggest partial SBO, likely secondary to peritoneal carcinomatosis  - Trial conservative management with bowel rest, NGT decompression and when necessary antiemetics. -Aggressive ambulation, ambulate around the nursing station every 2 hours. Keep potassium above 4. -Gen. surgery consulted, recommended to continue current conservative management. -Contact his surgeons at [facility] on Monday, he has tentative plans for tumor debulking supposed to be in May.  Ascites -Has malignant ascites secondary to his carcinomatosis, gets paracentesis every 2 weeks. -Last paracentesis on 4/4, likely he will need a paracentesis on Sunday or Monday.  Peritoneal mesothelioma with carcinomatosis  - Pt is followed by oncology and s/p 4th cycle of cisplatin/Alimta on 03/25/17  - Persistent omental thickening and peritoneal nodularity noted on CT, with mod vol intraperitoneal fluid similar to priors   Neutropenia, chemotherapy-induced - ANC 500 on admission without fever or apparent source of infection, ANC is 0.6 today. - Likely chemotherapy-induced  - Neutropenic precautions, check CBC in a.m.  Acute kidney injury  - SCr is 1.61 on admission, up from apparent baseline of ~0.8  - Likely secondary to prerenal azotemia and dehydration, this is resolved with IV fluids.  Hyponatremia  - Serum sodium 133 in setting of hypovolemia  - This is resolved after IV fluid hydration.    DVT prophylaxis:  Code Status: Full Code Family Communication:  Disposition Plan:  Diet: Diet NPO time specified Except for: Ice Chips  Consultants:   None  Procedures:   None  Antimicrobials:   None   Objective: Vitals:   04/02/17 1436 04/02/17 2034 04/03/17  0143 04/03/17 0432  BP: (!) 128/91 120/83  123/87  Pulse: 99 93  81  Resp: 16 18  18   Temp: 97.7 F (36.5 C) 98.8 F (37.1 C)  97.7 F (36.5 C)   TempSrc: Tympanic Oral  Oral  SpO2: 99% 97%  99%  Weight:   84.9 kg (187 lb 2.7 oz)   Height:        Intake/Output Summary (Last 24 hours) at 04/03/17 1134 Last data filed at 04/03/17 0700  Gross per 24 hour  Intake             2500 ml  Output             1275 ml  Net             1225 ml   Filed Weights   04/01/17 0544 04/02/17 0141 04/03/17 0143  Weight: 87.2 kg (192 lb 3.9 oz) 86.8 kg (191 lb 5.8 oz) 84.9 kg (187 lb 2.7 oz)    Examination: General exam: Appears calm and comfortable  Respiratory system: Clear to auscultation. Respiratory effort normal. Cardiovascular system: S1 & S2 heard, RRR. No JVD, murmurs, rubs, gallops or clicks. No pedal edema. Gastrointestinal system: Abdomen is nondistended, soft and nontender. No organomegaly or masses felt. Normal bowel sounds heard. Central nervous system: Alert and oriented. No focal neurological deficits. Extremities: Symmetric 5 x 5 power. Skin: No rashes, lesions or ulcers Psychiatry: Judgement and insight appear normal. Mood & affect appropriate.   Data Reviewed: I have personally reviewed following labs and imaging studies  CBC:  Recent Labs Lab 03/31/17 1417 03/31/17 1633 04/01/17 0530  WBC 1.1* 1.3* 1.6*  NEUTROABS 0.6* 0.5* 0.6*  HGB 11.9* 11.1* 9.6*  HCT 34.5* 32.2* 28.1*  MCV 86.9 87.5 88.4  PLT 297 313 419   Basic Metabolic Panel:  Recent Labs Lab 03/31/17 1417 03/31/17 1633 04/01/17 0530 04/02/17 0800  NA 135* 133* 135 134*  K 4.7 4.6 3.7 3.6  CL  --  92* 97* 97*  CO2 28 30 30 28   GLUCOSE 117 99 90 115*  BUN 34.1* 35* 32* 22*  CREATININE 1.8* 1.61* 1.31* 1.10  CALCIUM 9.0 8.4* 7.6* 8.0*   GFR: Estimated Creatinine Clearance: 79.3 mL/min (by C-G formula based on SCr of 1.1 mg/dL). Liver Function Tests:  Recent Labs Lab 03/31/17 1417 03/31/17 1633  AST 11 20  ALT 12 12*  ALKPHOS 71 55  BILITOT 0.34 0.8  PROT 6.2* 5.8*  ALBUMIN 2.4* 2.4*    Recent Labs Lab 03/31/17 1633  LIPASE  11   No results for input(s): AMMONIA in the last 168 hours. Coagulation Profile:  Recent Labs Lab 04/01/17 0530  INR 0.91   Cardiac Enzymes: No results for input(s): CKTOTAL, CKMB, CKMBINDEX, TROPONINI in the last 168 hours. BNP (last 3 results) No results for input(s): PROBNP in the last 8760 hours. HbA1C: No results for input(s): HGBA1C in the last 72 hours. CBG:  Recent Labs Lab 04/01/17 0748 04/02/17 0803 04/03/17 0727  GLUCAP 84 116* 109*   Lipid Profile: No results for input(s): CHOL, HDL, LDLCALC, TRIG, CHOLHDL, LDLDIRECT in the last 72 hours. Thyroid Function Tests: No results for input(s): TSH, T4TOTAL, FREET4, T3FREE, THYROIDAB in the last 72 hours. Anemia Panel: No results for input(s): VITAMINB12, FOLATE, FERRITIN, TIBC, IRON, RETICCTPCT in the last 72 hours. Urine analysis:    Component Value Date/Time   COLORURINE YELLOW 12/16/2016 1209   APPEARANCEUR HAZY (A) 12/16/2016 1209   LABSPEC 1.026 12/16/2016  [place] 5.0 12/16/2016 [place] 12/16/2016 1209   HGBUR NEGATIVE 12/16/2016 1209   BILIRUBINUR NEGATIVE 12/16/2016 1209   KETONESUR 80 (A) 12/16/2016 1209   PROTEINUR 100 (A) 12/16/2016 1209   NITRITE NEGATIVE 12/16/2016 1209   LEUKOCYTESUR NEGATIVE 12/16/2016 1209   Sepsis Labs: @LABRCNTIP (procalcitonin:4,lacticidven:4)  )No results found for this or any previous visit (from the past 240 hour(s)).   Invalid input(s): PROCALCITONIN, [place]   Radiology Studies: No results found.      Scheduled Meds: . folic acid  1 mg Intravenous Daily  . pantoprazole (PROTONIX) IV  40 mg Intravenous Q24H   Continuous Infusions: . dextrose 5 % and 0.45% NaCl 1,000 mL with potassium chloride 10 mEq infusion 100 mL/hr at 04/03/17 0345  . lidocaine-prilocaine       LOS: 3 days    Time spent: 35 minutes    Frank Tucker A, MD [facility] Pager [phone]  If 7PM-7AM, please contact  night-coverage [email or web address] Password [facility] 04/03/2017, 11:34 AM

## 2017-04-04 LAB — GLUCOSE, CAPILLARY: Glucose-Capillary: 100 mg/dL — ABNORMAL HIGH (ref 65–99)

## 2017-04-04 LAB — BASIC METABOLIC PANEL
ANION GAP: 8 (ref 5–15)
BUN: 18 mg/dL (ref 6–20)
CALCIUM: 7.9 mg/dL — AB (ref 8.9–10.3)
CO2: 29 mmol/L (ref 22–32)
Chloride: 98 mmol/L — ABNORMAL LOW (ref 101–111)
Creatinine, Ser: 1.16 mg/dL (ref 0.61–1.24)
Glucose, Bld: 101 mg/dL — ABNORMAL HIGH (ref 65–99)
POTASSIUM: 3.5 mmol/L (ref 3.5–5.1)
Sodium: 135 mmol/L (ref 135–145)

## 2017-04-04 MED ORDER — KCL IN DEXTROSE-NACL 10-5-0.45 MEQ/L-%-% IV SOLN
INTRAVENOUS | Status: DC
  Administered 2017-04-04 – 2017-04-05 (×2): via INTRAVENOUS
  Filled 2017-04-04 (×3): qty 1000

## 2017-04-04 MED ORDER — PHENOL 1.4 % MT LIQD
1.0000 | OROMUCOSAL | Status: DC | PRN
  Filled 2017-04-04: qty 177

## 2017-04-04 NOTE — Progress Notes (Signed)
  Subjective: CC: nausea. No complaints today. Passing some flatus. Denies abd pain  Objective: Vital signs in last 24 hours: Temp:  [98.1 F (36.7 C)-99.2 F (37.3 C)] 98.3 F (36.8 C) (04/15 0525) Pulse Rate:  [71-89] 71 (04/15 0525) Resp:  [16-18] 16 (04/15 0525) BP: (121-129)/(78-86) 125/84 (04/15 0525) SpO2:  [97 %-100 %] 100 % (04/15 0525) Weight:  [91.5 kg (201 lb 11.5 oz)] 91.5 kg (201 lb 11.5 oz) (04/15 0525) Last BM Date: 03/27/17  Intake/Output from previous day: 04/14 0701 - 04/15 0700 In: 2300 [I.V.:2300] Out: 800 [Emesis/NG output:800] Intake/Output this shift: No intake/output data recorded.  General appearance: alert and cooperative Resp: clear to auscultation bilaterally Cardio: regular rate and rhythm GI: soft, nontender. minimal distension  Lab Results:  No results for input(s): WBC, HGB, HCT, PLT in the last 72 hours. BMET  Recent Labs  04/02/17 0800 04/04/17 0500  NA 134* 135  K 3.6 3.5  CL 97* 98*  CO2 28 29  GLUCOSE 115* 101*  BUN 22* 18  CREATININE 1.10 1.16  CALCIUM 8.0* 7.9*   PT/INR No results for input(s): LABPROT, INR in the last 72 hours. ABG No results for input(s): PHART, HCO3 in the last 72 hours.  Invalid input(s): PCO2, PO2  Studies/Results: No results found.  Anti-infectives: Anti-infectives    None      Assessment/Plan: s/p * No surgery found * Continue ng and bowel rest for partial sbo  Carcinomatosis. Will contact [facility] tomorrow about transfer for possible [facility]. He is already scheduled with them in may.   LOS: 4 days    TOTH III,PAUL S 04/04/2017

## 2017-04-04 NOTE — Progress Notes (Signed)
PROGRESS NOTE  Frank KASSABIAN Sr.  NVB:[id number] DOB: Feb 22, 1961 DOA: 03/31/2017 PCP: No PCP Per Patient Outpatient Specialists:  Subjective: Continue conservative approach, continue to ambulate hallway. Per general surgery they will contact [facility] in AM for possible transfer  Brief Narrative:  Frank Tucker is a 56 y.o. 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 Patient underwent his fourth cycle of cisplatin/Alimta on 03/25/2017 and reports developing nausea with vomiting approximately 2 days later. Since that time, his condition has progressively worsened and he reports vomiting every 2-3 hours despite using Compazine, Reglan, and Zofran at home. Over this interval, he has noted a decreased urine output, but denies any fevers or chills. He also notes that his vomitus has become brown in color without clots, red blood, or grit. He denies melena or hematochezia and has not had a bowel movement in several days. Patient also reports an 11 pound weight loss since the onset of his symptoms and has not been eating or drinking much of anything as this worsens his complaints. There are no alleviating factors identified. He denies pain per se, but reports a generalized abdominal discomfort that is constant. He has had nausea and vomiting since starting chemotherapy, but never to this degree. He denies chest pain or palpitations and denies dyspnea or cough.  Assessment & Plan:   Principal Problem:   Small bowel obstruction, partial (HCC) Active Problems:   Peritoneal carcinomatosis (HCC)   Malignant ascites   Nausea with vomiting   Dehydration   Chemotherapy induced neutropenia (HCC)   Hyponatremia   AKI (acute kidney injury) ([place])   Partial small  bowel obstruction (HCC)   Partial SBO  -Pt presents with severe N/V and generalized abd discomfort  -CT findings suggest partial SBO, likely secondary to peritoneal carcinomatosis  - Trial conservative management with bowel rest, NGT decompression and when necessary antiemetics. -Aggressive ambulation, ambulate around the nursing station every 2 hours. Keep potassium above 4. -Gen. surgery consulted, recommended to continue current conservative management. -He is scheduled for tumor debulking surgery in May, per general surgery contact with [facility] in a.m.  Ascites -Has malignant ascites secondary to his carcinomatosis, gets paracentesis every 2 weeks. -Last paracentesis on 4/4, likely he will need a paracentesis on Sunday or Monday.  Peritoneal mesothelioma with carcinomatosis  - Pt is followed by oncology and s/p 4th cycle of cisplatin/Alimta on 03/25/17  - Persistent omental thickening and peritoneal nodularity noted on CT, with mod vol intraperitoneal fluid similar to priors   Neutropenia, chemotherapy-induced - ANC 500 on admission without fever or apparent source of infection, ANC is 0.6 today. - Likely chemotherapy-induced  - Neutropenic precautions, check CBC in a.m.  Acute kidney injury  - SCr is 1.61 on admission, up from apparent baseline of ~0.8  - Likely secondary to prerenal azotemia and dehydration, this is resolved with IV fluids.  Hyponatremia  - Serum sodium 133 in setting of hypovolemia  - This is resolved after IV fluid hydration.    DVT prophylaxis:  Code Status: Full Code Family Communication:  Disposition Plan:  Diet: Diet NPO time specified Except for: Ice Chips  Consultants:   None  Procedures:   None  Antimicrobials:   None   Objective: Vitals:   04/03/17 0432 04/03/17 1410 04/03/17 1952 04/04/17  0525  BP: 123/87 121/86 129/78 125/84  Pulse: 81 89 88 71  Resp: 18 18 18 16   Temp: 97.7 F (36.5 C) 98.1 F (36.7 C) 99.2 F  (37.3 C) 98.3 F (36.8 C)  TempSrc: Oral Oral Oral Oral  SpO2: 99% 97% 99% 100%  Weight:    91.5 kg (201 lb 11.5 oz)  Height:        Intake/Output Summary (Last 24 hours) at 04/04/17 1046 Last data filed at 04/04/17 0600  Gross per 24 hour  Intake             2300 ml  Output              800 ml  Net             1500 ml   Filed Weights   04/02/17 0141 04/03/17 0143 04/04/17 0525  Weight: 86.8 kg (191 lb 5.8 oz) 84.9 kg (187 lb 2.7 oz) 91.5 kg (201 lb 11.5 oz)    Examination: General exam: Appears calm and comfortable  Respiratory system: Clear to auscultation. Respiratory effort normal. Cardiovascular system: S1 & S2 heard, RRR. No JVD, murmurs, rubs, gallops or clicks. No pedal edema. Gastrointestinal system: Abdomen is nondistended, soft and nontender. No organomegaly or masses felt. Normal bowel sounds heard. Central nervous system: Alert and oriented. No focal neurological deficits. Extremities: Symmetric 5 x 5 power. Skin: No rashes, lesions or ulcers Psychiatry: Judgement and insight appear normal. Mood & affect appropriate.   Data Reviewed: I have personally reviewed following labs and imaging studies  CBC:  Recent Labs Lab 03/31/17 1417 03/31/17 1633 04/01/17 0530  WBC 1.1* 1.3* 1.6*  NEUTROABS 0.6* 0.5* 0.6*  HGB 11.9* 11.1* 9.6*  HCT 34.5* 32.2* 28.1*  MCV 86.9 87.5 88.4  PLT 297 313 355   Basic Metabolic Panel:  Recent Labs Lab 03/31/17 1417 03/31/17 1633 04/01/17 0530 04/02/17 0800 04/04/17 0500  NA 135* 133* 135 134* 135  K 4.7 4.6 3.7 3.6 3.5  CL  --  92* 97* 97* 98*  CO2 28 30 30 28 29   GLUCOSE 117 99 90 115* 101*  BUN 34.1* 35* 32* 22* 18  CREATININE 1.8* 1.61* 1.31* 1.10 1.16  CALCIUM 9.0 8.4* 7.6* 8.0* 7.9*   GFR: Estimated Creatinine Clearance: 82.8 mL/min (by C-G formula based on SCr of 1.16 mg/dL). Liver Function Tests:  Recent Labs Lab 03/31/17 1417 03/31/17 1633  AST 11 20  ALT 12 12*  ALKPHOS 71 55  BILITOT 0.34 0.8    PROT 6.2* 5.8*  ALBUMIN 2.4* 2.4*    Recent Labs Lab 03/31/17 1633  LIPASE 11   No results for input(s): AMMONIA in the last 168 hours. Coagulation Profile:  Recent Labs Lab 04/01/17 0530  INR 0.91   Cardiac Enzymes: No results for input(s): CKTOTAL, CKMB, CKMBINDEX, TROPONINI in the last 168 hours. BNP (last 3 results) No results for input(s): PROBNP in the last 8760 hours. HbA1C: No results for input(s): HGBA1C in the last 72 hours. CBG:  Recent Labs Lab 04/01/17 0748 04/02/17 0803 04/03/17 0727 04/04/17 0746  GLUCAP 84 116* 109* 100*   Lipid Profile: No results for input(s): CHOL, HDL, LDLCALC, TRIG, CHOLHDL, LDLDIRECT in the last 72 hours. Thyroid Function Tests: No results for input(s): TSH, T4TOTAL, FREET4, T3FREE, THYROIDAB in the last 72 hours. Anemia Panel: No results for input(s): VITAMINB12, FOLATE, FERRITIN, TIBC, IRON, RETICCTPCT in the last 72 hours. Urine analysis:    Component Value Date/Time   COLORURINE  YELLOW 12/16/2016 1209   APPEARANCEUR HAZY (A) 12/16/2016 1209   LABSPEC 1.026 12/16/2016 1209   PHURINE 5.0 12/16/2016 1209   GLUCOSEU NEGATIVE 12/16/2016 1209   HGBUR NEGATIVE 12/16/2016 1209   BILIRUBINUR NEGATIVE 12/16/2016 1209   KETONESUR 80 (A) 12/16/2016 1209   PROTEINUR 100 (A) 12/16/2016 1209   NITRITE NEGATIVE 12/16/2016 1209   LEUKOCYTESUR NEGATIVE 12/16/2016 1209   Sepsis Labs: @LABRCNTIP (procalcitonin:4,lacticidven:4)  )No results found for this or any previous visit (from the past 240 hour(s)).   Invalid input(s): PROCALCITONIN, [place]   Radiology Studies: No results found.      Scheduled Meds: . folic acid  1 mg Intravenous Daily  . pantoprazole (PROTONIX) IV  40 mg Intravenous Q24H   Continuous Infusions: . dextrose 5 % and 0.45% NaCl 1,000 mL with potassium chloride 10 mEq infusion 100 mL/hr at 04/04/17 1006  . lidocaine-prilocaine       LOS: 4 days    Time spent: 35  minutes    Chazlyn Cude A, MD [facility] Pager [phone]  If 7PM-7AM, please contact night-coverage [email or web address] Password [facility] 04/04/2017, 10:46 AM

## 2017-04-05 LAB — CBC WITH DIFFERENTIAL/PLATELET
BASOS ABS: 0 10*3/uL (ref 0.0–0.1)
BASOS PCT: 0 %
EOS PCT: 0 %
Eosinophils Absolute: 0 10*3/uL (ref 0.0–0.7)
HEMATOCRIT: 24 % — AB (ref 39.0–52.0)
HEMOGLOBIN: 8.3 g/dL — AB (ref 13.0–17.0)
LYMPHS PCT: 18 %
Lymphs Abs: 1 10*3/uL (ref 0.7–4.0)
MCH: 30.7 pg (ref 26.0–34.0)
MCHC: 34.6 g/dL (ref 30.0–36.0)
MCV: 88.9 fL (ref 78.0–100.0)
MONO ABS: 0.9 10*3/uL (ref 0.1–1.0)
Monocytes Relative: 15 %
Neutro Abs: 4 10*3/uL (ref 1.7–7.7)
Neutrophils Relative %: 67 %
Platelets: 226 10*3/uL (ref 150–400)
RBC: 2.7 MIL/uL — ABNORMAL LOW (ref 4.22–5.81)
RDW: 16.4 % — AB (ref 11.5–15.5)
WBC: 5.9 10*3/uL (ref 4.0–10.5)

## 2017-04-05 LAB — BASIC METABOLIC PANEL
Anion gap: 8 (ref 5–15)
BUN: 15 mg/dL (ref 6–20)
CHLORIDE: 98 mmol/L — AB (ref 101–111)
CO2: 28 mmol/L (ref 22–32)
CREATININE: 1.12 mg/dL (ref 0.61–1.24)
Calcium: 7.8 mg/dL — ABNORMAL LOW (ref 8.9–10.3)
Glucose, Bld: 99 mg/dL (ref 65–99)
Potassium: 3.5 mmol/L (ref 3.5–5.1)
SODIUM: 134 mmol/L — AB (ref 135–145)

## 2017-04-05 LAB — GLUCOSE, CAPILLARY: GLUCOSE-CAPILLARY: 94 mg/dL (ref 65–99)

## 2017-04-05 MED ORDER — POTASSIUM CHLORIDE 2 MEQ/ML IV SOLN: INTRAVENOUS | Status: DC

## 2017-04-05 MED ORDER — KCL IN DEXTROSE-NACL 20-5-0.45 MEQ/L-%-% IV SOLN
INTRAVENOUS | Status: DC
  Administered 2017-04-05 – 2017-04-06 (×2): via INTRAVENOUS
  Filled 2017-04-05 (×3): qty 1000

## 2017-04-05 NOTE — Progress Notes (Addendum)
IP PROGRESS NOTE  Subjective:   Frank Tucker reports passing small amounts of flatus. No bowel movement. The NG tube is uncomfortable.  Objective: Vital signs in last 24 hours: Blood pressure 112/62, pulse 76, temperature 97.3 F (36.3 C), temperature source Oral, resp. rate 16, height 5\' 10"  (1.778 m), weight 201 lb 11.5 oz (91.5 kg), SpO2 96 %.  Intake/Output from previous day: 04/15 0701 - 04/16 0700 In: 1615 [P.O.:480; I.V.:1135] Out: 550 [Emesis/NG output:550]  Physical Exam:  HEENT: No thrush Lungs: Clear bilaterally Cardiac: Regular rate and rhythm Abdomen: Distended, no mass Extremities: Trace low pretibial edema bilaterally   Portacath/PICC-without erythema  Lab Results: BMET  Recent Labs  04/04/17 0500 04/05/17 0600  NA 135 134*  K 3.5 3.5  CL 98* 98*  CO2 29 28  GLUCOSE 101* 99  BUN 18 15  CREATININE 1.16 1.12  CALCIUM 7.9* 7.8*    Studies/Results: No results found.  Medications: I have reviewed the patient's current medications.  Assessment/Plan:  1. Abdominal carcinomatosis-primary peritoneal mesothelioma ? Paracentesis on 12/17/2016 confirmed malignant cells consistent with carcinoma, cytokeratin and WT-1 positive ? CTs of the chest, abdomen, and pelvis with ascites and omental caking. No other evidence of metastatic disease and no primary tumor site identified ? Upper endoscopy 12/29/2016-negative for malignancy ? PET scan 01/01/2017 with extensive hypermetabolic omental caking of tumor with extensive ascites, no primary tumor site identified ? 01/08/2017 omental mass biopsy-malignant mesothelioma ? Cycle 1 cisplatin/Alimta 01/21/2017 ? Cycle 2 cisplatin/Alimta 02/11/2017 ? Cycle 3 cisplatin/Alimta 03/04/2017 ? Cycle 4 cisplatin/Alimta 03/25/2017 ? CT 04/01/2017-persistent ascites and omental thickening/peritoneal nodularity-Slightly improved, new small bowel obstruction   2. Yeast rash in the axilla and groin.  3.  Port-A-Cath placement 01/20/2017  4. Delayed nauseasecondary to chemotherapy-persistent following cycle 2 despite Decadron prophylaxis, improved with cycles 3 with the addition of Reglan  5. Malignant ascites             6.   Elevated creatinine-likely secondary to dehydration versus toxicity from cisplatin             7.   Neutropenia secondary to chemotherapy  Mr. Dagher continues to have clinical evidence of a small bowel obstruction. I discussed the case with Dr. Johney Maine. We will attempt to contact Dr.Vontanopoulos at [facility] to consider transfer they are. I attempted to contact him this morning, but could not get through the physician access line.  Recommendations:  1. Bowel rest and NG tube decompression  2. Repeat CBC to follow-up on neutrophil count. 3. Contact Dr.[facility]otanopoulos at [facility] to consider transfer  Addendum: I contacted Dr. Johney Maine at [facility]. He does not recommend surgery given the proximity to chemotherapy and continued requirement for paracentesis procedures. He feels the bowel obstruction and persistent ascites are poor prognostic features for the ability to undergo debulking surgery and experience a long-term survival. He recommends we continue bowel rest and consider a Hospice referral. I discussed the case with Dr. Hartford Poli. We will begin TNA and hope for improvement of the obstruction with further bowel rest.   LOS: 5 days   Betsy Coder, MD   04/05/2017, 8:25 AM

## 2017-04-05 NOTE — Progress Notes (Signed)
Subjective: CC:  Peritoneal mesothelioma with carcinomatosis/ SBO  No real change, less uncomfortable than at admit.  NG suction OK.  Changes sump filter.    Objective: Vital signs in last 24 hours: Temp:  [97.3 F (36.3 C)-99.2 F (37.3 C)] 99.2 F (37.3 C) (04/16 1519) Pulse Rate:  [76-89] 86 (04/16 1519) Resp:  [16-20] 16 (04/16 1519) BP: (111-122)/(62-89) 111/75 (04/16 1519) SpO2:  [96 %-99 %] 99 % (04/16 1519) Last BM Date: 03/27/17 480 PO Voided x 5 NG 550 recorded No Bm recorded Afebrile, VSS WBC 5.9H/H 8.3/24 BMP OK NO films  Intake/Output from previous day: 04/15 0701 - 04/16 0700 In: 1615 [P.O.:480; I.V.:1135] Out: 550 [Emesis/NG output:550] Intake/Output this shift: Total I/O In: -  Out: 250 [Emesis/NG output:250]  General appearance: alert, cooperative and no distress GI: soft, not tender or distended much.  No BS, some flatus.    Lab Results:   Recent Labs  04/05/17 1545  WBC 5.9  HGB 8.3*  HCT 24.0*  PLT 226    BMET  Recent Labs  04/04/17 0500 04/05/17 0600  NA 135 134*  K 3.5 3.5  CL 98* 98*  CO2 29 28  GLUCOSE 101* 99  BUN 18 15  CREATININE 1.16 1.12  CALCIUM 7.9* 7.8*   PT/INR No results for input(s): LABPROT, INR in the last 72 hours.   Recent Labs Lab 03/31/17 1417 03/31/17 1633  AST 11 20  ALT 12 12*  ALKPHOS 71 55  BILITOT 0.34 0.8  PROT 6.2* 5.8*  ALBUMIN 2.4* 2.4*     Lipase     Component Value Date/Time   LIPASE 11 03/31/2017 1633     Studies/Results: No results found. Prior to Admission medications   Medication Sig Start Date End Date Taking? Authorizing Provider  dexamethasone (DECADRON) 4 MG tablet Take 1 tablet (4 mg total) by mouth 2 (two) times daily. For 3 days. Begin day after chemo. 03/22/17  Yes Ladell Pier, MD  famotidine (PEPCID) 20 MG tablet Take 20 mg by mouth 2 (two) times daily.   Yes Historical Provider, MD  folic acid (FOLVITE) 1 MG tablet Take 1 tablet (1 mg total) by mouth  daily. 01/12/17  Yes Owens Shark, NP  lidocaine-prilocaine (EMLA) cream Apply to portacath site 1 hour prior to use. 01/12/17  Yes Owens Shark, NP  LORazepam (ATIVAN) 1 MG tablet Take 1 tablet (1 mg total) by mouth every 6 (six) hours as needed (nausea). DO NOT DRIVE 5/57/32  Yes Ladell Pier, MD  metoCLOPramide (REGLAN) 10 MG tablet Take 1 tablet (10 mg total) by mouth 3 (three) times daily before meals. DO NOT TAKE COMPAZINE. 03/18/17  Yes Ladell Pier, MD  nystatin (MYCOSTATIN/NYSTOP) powder Apply topically 2 (two) times daily. To under arm rash 03/04/17  Yes Ladell Pier, MD  ondansetron (ZOFRAN) 8 MG tablet Take 1 tablet (8 mg total) by mouth every 8 (eight) hours as needed for nausea or vomiting. Begin 3 days after chemo. 03/26/17  Yes Ladell Pier, MD  oxyCODONE-acetaminophen (PERCOCET/ROXICET) 5-325 MG tablet Take 1 tablet by mouth every 4 (four) hours as needed for severe pain. 03/25/17  Yes Ladell Pier, MD  Probiotic Product (PROBIOTIC PO) Take 1 tablet by mouth daily.   Yes Historical Provider, MD  prochlorperazine (COMPAZINE) 10 MG tablet Take 1 tablet (10 mg total) by mouth every 6 (six) hours as needed for nausea or vomiting. Patient not taking: Reported on 03/04/2017 01/29/17  Owens Shark, NP    Medications: . folic acid  1 mg Intravenous Daily  . pantoprazole (PROTONIX) IV  40 mg Intravenous Q24H   . dextrose 5 % and 0.45 % NaCl with KCl 20 mEq/L 100 mL/hr at 04/05/17 1540  . lidocaine-prilocaine      Assessment/Plan Peritoneal mesothelioma with carcinomatosis with SBO Malignant ascites Chemo-induced neutropenia Acute kidney disease FEN:  IV fluids/NPO ID: No abx DVT:  SCD only  Plan:  Dr. Johney Maine talked with Dr. Benay Spice.  Agree with transfer to [facility] for higher level Rx.        LOS: 5 days    Ronny Ruddell 04/05/2017 [phone]

## 2017-04-05 NOTE — Care Management Note (Signed)
Case Management Note  Patient Details  Name: Frank BASQUEZ Sr. MRN: [id number] Date of Birth: 05-Jun-1961  Subjective/Objective:      56 yo admitted with Malignant bowel obstruction.              Action/Plan: Pt from home with spouse. Pt independent with ADLs prior to admission. CM will continue to follow for DC needs.  Expected Discharge Date:   (unknown)               Expected Discharge Plan:  Home/Self Care  In-House Referral:     Discharge planning Services  CM Consult  Post Acute Care Choice:    Choice offered to:     DME Arranged:    DME Agency:     HH Arranged:    HH Agency:     Status of Service:  In process, will continue to follow  If discussed at Long Length of Stay Meetings, dates discussed:    Additional CommentsLynnell Catalan, RN 04/05/2017, 1:08 PM  [phone]

## 2017-04-05 NOTE — Progress Notes (Signed)
PROGRESS NOTE  Frank TASSINARI Sr.  QMG:[id number] DOB: [birth year] DOA: 03/31/2017 PCP: No PCP Per Patient Outpatient Specialists:  Subjective: Seen with his son at bedside, denies any abdominal pain. No bowel movements, still passes some gas. Await general surgery recommendation for referral to [facility].  Brief Narrative:  Frank Tucker is a 56 y.o. 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 Patient underwent his fourth cycle of cisplatin/Alimta on 03/25/2017 and reports developing nausea with vomiting approximately 2 days later. Since that time, his condition has progressively worsened and he reports vomiting every 2-3 hours despite using Compazine, Reglan, and Zofran at home. Over this interval, he has noted a decreased urine output, but denies any fevers or chills. He also notes that his vomitus has become brown in color without clots, red blood, or grit. He denies melena or hematochezia and has not had a bowel movement in several days. Patient also reports an 11 pound weight loss since the onset of his symptoms and has not been eating or drinking much of anything as this worsens his complaints. There are no alleviating factors identified. He denies pain per se, but reports a generalized abdominal discomfort that is constant. He has had nausea and vomiting since starting chemotherapy, but never to this degree. He denies chest pain or palpitations and denies dyspnea or cough.  Assessment & Plan:   Principal Problem:   Malignant Bowel Obstruction Active Problems:   Peritoneal carcinomatosis ([place])   Malignant ascites   Mesothelioma of peritoneum ([place])   Nausea with vomiting   Dehydration   Chemotherapy induced neutropenia (HCC)   Hyponatremia  AKI (acute kidney injury) ([place])   Partial SBO  -Pt presents with severe N/V and generalized abd discomfort  -CT findings suggest partial SBO, likely secondary to peritoneal carcinomatosis  - Trial conservative management with bowel rest, NGT decompression and when necessary antiemetics. -Aggressive ambulation, ambulate around the nursing station every 2 hours. Keep potassium above 4. -Gen. surgery consulted, recommended to continue current conservative management. -He is scheduled for tumor debulking surgery in May, per general surgery contact her surgeon in [facility]  Ascites -Has malignant ascites secondary to his carcinomatosis, gets paracentesis every 2 weeks. -Last paracentesis on 4/4, likely he will need a paracentesis on Sunday or Monday.  Peritoneal mesothelioma with carcinomatosis  - Pt is followed by oncology and s/p 4th cycle of cisplatin/Alimta on 03/25/17  - Persistent omental thickening and peritoneal nodularity noted on CT, with mod vol intraperitoneal fluid similar to priors   Neutropenia, chemotherapy-induced - ANC 500 on admission without fever or apparent source of infection, ANC is 0.6 today. - Likely chemotherapy-induced  - Neutropenic precautions, check CBC in a.m.  Acute kidney injury  - SCr is 1.61 on admission, up from apparent baseline of ~0.8  - Likely secondary to prerenal azotemia and dehydration, this is resolved with IV fluids.  Hyponatremia  - Serum sodium 133 in setting of hypovolemia  - This is resolved after IV fluid hydration.    DVT prophylaxis:  Code Status: Full Code Family Communication:  Disposition Plan:  Diet: Diet NPO time specified Except for: Ice Chips  Consultants:   None  Procedures:   None  Antimicrobials:   None   Objective: Vitals:   04/04/17 0525 04/04/17 1359 04/04/17 2223 04/05/17  0648  BP: 125/84 106/71 122/89 112/62  Pulse: 71 79 89 76  Resp: 16 16 20 16   Temp: 98.3 F (36.8 C) 98.3 F (36.8 C) 99 F  (37.2 C) 97.3 F (36.3 C)  TempSrc: Oral Oral Oral Oral  SpO2: 100% 99% 97% 96%  Weight: 91.5 kg (201 lb 11.5 oz)     Height:        Intake/Output Summary (Last 24 hours) at 04/05/17 1456 Last data filed at 04/05/17 0700  Gross per 24 hour  Intake             1375 ml  Output              550 ml  Net              825 ml   Filed Weights   04/02/17 0141 04/03/17 0143 04/04/17 0525  Weight: 86.8 kg (191 lb 5.8 oz) 84.9 kg (187 lb 2.7 oz) 91.5 kg (201 lb 11.5 oz)    Examination: General exam: Appears calm and comfortable  Respiratory system: Clear to auscultation. Respiratory effort normal. Cardiovascular system: S1 & S2 heard, RRR. No JVD, murmurs, rubs, gallops or clicks. No pedal edema. Gastrointestinal system: Abdomen is nondistended, soft and nontender. No organomegaly or masses felt. Normal bowel sounds heard. Central nervous system: Alert and oriented. No focal neurological deficits. Extremities: Symmetric 5 x 5 power. Skin: No rashes, lesions or ulcers Psychiatry: Judgement and insight appear normal. Mood & affect appropriate.   Data Reviewed: I have personally reviewed following labs and imaging studies  CBC:  Recent Labs Lab 03/31/17 1417 03/31/17 1633 04/01/17 0530  WBC 1.1* 1.3* 1.6*  NEUTROABS 0.6* 0.5* 0.6*  HGB 11.9* 11.1* 9.6*  HCT 34.5* 32.2* 28.1*  MCV 86.9 87.5 88.4  PLT 297 313 867   Basic Metabolic Panel:  Recent Labs Lab 03/31/17 1633 04/01/17 0530 04/02/17 0800 04/04/17 0500 04/05/17 0600  NA 133* 135 134* 135 134*  K 4.6 3.7 3.6 3.5 3.5  CL 92* 97* 97* 98* 98*  CO2 30 30 28 29 28   GLUCOSE 99 90 115* 101* 99  BUN 35* 32* 22* 18 15  CREATININE 1.61* 1.31* 1.10 1.16 1.12  CALCIUM 8.4* 7.6* 8.0* 7.9* 7.8*   GFR: Estimated Creatinine Clearance: 85.7 mL/min (by C-G formula based on SCr of 1.12 mg/dL). Liver Function Tests:  Recent Labs Lab 03/31/17 1417 03/31/17 1633  AST 11 20  ALT 12 12*  ALKPHOS 71 55  BILITOT 0.34 0.8  PROT  6.2* 5.8*  ALBUMIN 2.4* 2.4*    Recent Labs Lab 03/31/17 1633  LIPASE 11   No results for input(s): AMMONIA in the last 168 hours. Coagulation Profile:  Recent Labs Lab 04/01/17 0530  INR 0.91   Cardiac Enzymes: No results for input(s): CKTOTAL, CKMB, CKMBINDEX, TROPONINI in the last 168 hours. BNP (last 3 results) No results for input(s): PROBNP in the last 8760 hours. HbA1C: No results for input(s): HGBA1C in the last 72 hours. CBG:  Recent Labs Lab 04/01/17 0748 04/02/17 0803 04/03/17 0727 04/04/17 0746 04/05/17 0753  GLUCAP 84 116* 109* 100* 94   Lipid Profile: No results for input(s): CHOL, HDL, LDLCALC, TRIG, CHOLHDL, LDLDIRECT in the last 72 hours. Thyroid Function Tests: No results for input(s): TSH, T4TOTAL, FREET4, T3FREE, THYROIDAB in the last 72 hours. Anemia Panel: No results for input(s): VITAMINB12, FOLATE, FERRITIN, TIBC, IRON, RETICCTPCT in the last 72 hours. Urine analysis:    Component Value Date/Time  COLORURINE YELLOW 12/16/2016 1209   APPEARANCEUR HAZY (A) 12/16/2016 1209   LABSPEC 1.026 12/16/2016 1209   PHURINE 5.0 12/16/2016 1209   GLUCOSEU NEGATIVE 12/16/2016 1209   HGBUR NEGATIVE 12/16/2016 1209   BILIRUBINUR NEGATIVE 12/16/2016 1209   KETONESUR 80 (A) 12/16/2016 1209   PROTEINUR 100 (A) 12/16/2016 1209   NITRITE NEGATIVE 12/16/2016 1209   LEUKOCYTESUR NEGATIVE 12/16/2016 1209   Sepsis Labs: @LABRCNTIP (procalcitonin:4,lacticidven:4)  )No results found for this or any previous visit (from the past 240 hour(s)).   Invalid input(s): PROCALCITONIN, [place]   Radiology Studies: No results found.      Scheduled Meds: . folic acid  1 mg Intravenous Daily  . pantoprazole (PROTONIX) IV  40 mg Intravenous Q24H   Continuous Infusions: . dextrose 5 % and 0.45 % NaCl with KCl 10 mEq/L 100 mL/hr at 04/05/17 0551  . lidocaine-prilocaine       LOS: 5 days    Time spent: 35 minutes    Saket Hellstrom A, MD [facility] Pager [phone]  If 7PM-7AM, please contact night-coverage [email or web address] Password [facility] 04/05/2017, 2:56 PM

## 2017-04-06 ENCOUNTER — Inpatient Hospital Stay (HOSPITAL_COMMUNITY): Payer: PRIVATE HEALTH INSURANCE

## 2017-04-06 ENCOUNTER — Encounter: Payer: Self-pay | Admitting: *Deleted

## 2017-04-06 ENCOUNTER — Telehealth: Payer: Self-pay | Admitting: *Deleted

## 2017-04-06 DIAGNOSIS — Z0189 Encounter for other specified special examinations: Secondary | ICD-10-CM

## 2017-04-06 LAB — CBC WITH DIFFERENTIAL/PLATELET
Basophils Absolute: 0 10*3/uL (ref 0.0–0.1)
Basophils Relative: 0 %
EOS ABS: 0 10*3/uL (ref 0.0–0.7)
Eosinophils Relative: 0 %
HEMATOCRIT: 22.2 % — AB (ref 39.0–52.0)
Hemoglobin: 7.6 g/dL — ABNORMAL LOW (ref 13.0–17.0)
Lymphocytes Relative: 10 %
Lymphs Abs: 0.7 10*3/uL (ref 0.7–4.0)
MCH: 30.6 pg (ref 26.0–34.0)
MCHC: 34.2 g/dL (ref 30.0–36.0)
MCV: 89.5 fL (ref 78.0–100.0)
MONO ABS: 1.1 10*3/uL — AB (ref 0.1–1.0)
Monocytes Relative: 17 %
Neutro Abs: 4.7 10*3/uL (ref 1.7–7.7)
Neutrophils Relative %: 73 %
Platelets: 202 10*3/uL (ref 150–400)
RBC: 2.48 MIL/uL — ABNORMAL LOW (ref 4.22–5.81)
RDW: 16.6 % — AB (ref 11.5–15.5)
WBC: 6.4 10*3/uL (ref 4.0–10.5)

## 2017-04-06 LAB — BASIC METABOLIC PANEL
ANION GAP: 8 (ref 5–15)
BUN: 14 mg/dL (ref 6–20)
CHLORIDE: 102 mmol/L (ref 101–111)
CO2: 27 mmol/L (ref 22–32)
CREATININE: 1.07 mg/dL (ref 0.61–1.24)
Calcium: 7.7 mg/dL — ABNORMAL LOW (ref 8.9–10.3)
GFR calc non Af Amer: >60 mL/min (ref >60)
GLUCOSE: 108 mg/dL — AB (ref 65–99)
Potassium: 3.6 mmol/L (ref 3.5–5.1)
Sodium: 137 mmol/L (ref 135–145)

## 2017-04-06 LAB — GLUCOSE, CAPILLARY: GLUCOSE-CAPILLARY: 102 mg/dL — AB (ref 65–99)

## 2017-04-06 MED ORDER — BISACODYL 10 MG RE SUPP
10.0000 mg | Freq: Once | RECTAL | Status: AC
  Administered 2017-04-06: 10 mg via RECTAL
  Filled 2017-04-06: qty 1

## 2017-04-06 MED ORDER — POTASSIUM CHLORIDE 2 MEQ/ML IV SOLN
INTRAVENOUS | Status: DC
  Administered 2017-04-06 – 2017-04-07 (×3): via INTRAVENOUS
  Filled 2017-04-06 (×5): qty 1000

## 2017-04-06 MED ORDER — DIATRIZOATE MEGLUMINE & SODIUM 66-10 % PO SOLN
90.0000 mL | Freq: Once | ORAL | Status: AC
  Administered 2017-04-06: 90 mL via NASOGASTRIC
  Filled 2017-04-06: qty 90

## 2017-04-06 NOTE — Telephone Encounter (Signed)
Message received from Wilber Oliphant to see if [facility] has received accelerated death benefit form for patient.  Call placed back to [facility] and message left to inform him that form has not been received at this time per Bryson Corona. Fax # [phone] given to Shanon Brow to send form to [facility].   Instructed David to call [place] back with any further questions.

## 2017-04-06 NOTE — Progress Notes (Signed)
PROGRESS NOTE    Frank Tucker Sr.  WIO:[id number] DOB: 03-Jan-1961 DOA: 03/31/2017 PCP: No PCP Per Patient   Subjective: Patient denies having any bowel movements yet. Still passing gas. He has increased weakness due to NPO status.   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 Patient underwent his fourth cycle of cisplatin/Alimta on 03/25/2017 and reports developing nausea with vomiting approximately 2 days later. Since that time, his condition has progressively worsened and he reports vomiting every 2-3 hours despite using Compazine, Reglan, and Zofran at home. Over this interval, he has noted a decreased urine output, but denies any fevers or chills. He also notes that his vomitus has become brown in color without clots, red blood, or grit. He denies melena or hematochezia and has not had a bowel movement in several days. Patient also reports an 11 pound weight loss since the onset of his symptoms and has not been eating or drinking much of anything as this worsens his complaints. There are no alleviating factors identified. He denies pain per se, but reports a generalized abdominal discomfort that is constant. He has had nausea and vomiting since starting chemotherapy, but never to this degree. He denies chest pain or palpitations and denies dyspnea or cough.   Assessment & Plan:   Principal Problem:   Malignant Bowel Obstruction Active Problems:   Peritoneal carcinomatosis ([place])   Malignant ascites   Mesothelioma of peritoneum ([place])   Nausea with vomiting   Dehydration   Chemotherapy induced neutropenia (HCC)   Hyponatremia   AKI (acute kidney injury) ([place])   Partial SBO  -Pt presents with severe N/V and  generalized abd discomfort  -CT findings suggest partial SBO, likely secondary to peritoneal carcinomatosis  - Trial conservative management with bowel rest, NGT decompression and when necessary antiemetics. -Aggressive ambulation, ambulate around the nursing station every 2 hours.  - Surgical oncologist at [facility], Dr. Johney Maine, was contacted by Dr. Benay Spice and at this time, he does not recommend the patient for surgical intervention due to little response to chemotherapy.  - Continue NG tube and administer Dulcolax to assist bowel motility - Begin TPN through port a cath, remain NPO. Consider transitioning to gastrostomy tube, per Dr. Johney Maine' recommendation  Ascites -Has malignant ascites secondary to his carcinomatosis, gets paracentesis every 2 weeks. -Last paracentesis on 4/4 - Paracentesis ordered on 4/16  Peritoneal mesothelioma with carcinomatosis  - Pt is followed by oncology and s/p 4th cycle of cisplatin/Alimta on 03/25/17  - Persistent omental thickening and peritoneal nodularity noted on CT, with mod vol intraperitoneal fluid similar to priors - Dr. Learta Codding talked to Dr. Johney Maine and he recommends palliative care consult.  - Will discuss palliative care with Dr. Learta Codding.  - Patient has previously been seen at [facility] for planning of laparotomy with tumor debulking and intraperitoneal chemotherapy. This was tentatively planned for May 2018. Discuss transitioning care to [facility].  Anemia - Hgb 9.6 on admission and trending down to 7.6 today - Recheck CBC in a.m. from peripheral stick. Transfuse if it continues to be less than 8.0.  Neutropenia, chemotherapy-induced - ANC 500 on admission without fever or apparent source of infection, likely chemotherapy-induced  - ANC is WNL at 4.7 today - Neutropenic precautions,  continue to monitor CBC.   Acute kidney injury  - SCr is 1.61 on admission, up from apparent baseline of ~0.8  -  Likely secondary to prerenal azotemia and dehydration, this is resolved with IV fluids. Cr 1.07 BUN 14 today.   Hyponatremia  - Serum sodium 133 in setting of hypovolemia  - This is resolved after IV fluid hydration, serum sodium 137 today.  Hypokalemia - K 3.5, receiving dextrose 5% and 0.45% NaCl with KCl 20 mEq/L infusion.  - Consider increasing to 40 mEq/L infusion  DVT prophylaxis: SCD Code Status: Full Family Communication: Son not at bedside today Disposition Plan: Maybe palliative care, discuss with Dr. Learta Codding  Consultants:   Oncology  General surgery  IR  Procedures:   Paracentesis   Antimicrobials:  None  Objective: Vitals:   04/05/17 2051 04/06/17 0444 04/06/17 1057 04/06/17 1103  BP: 137/76 115/68 127/90 123/80  Pulse: 76 87    Resp: 18 16    Temp: 98 F (36.7 C) 99 F (37.2 C)    TempSrc: Oral Oral    SpO2: 98% 99%    Weight:      Height:        Intake/Output Summary (Last 24 hours) at 04/06/17 1109 Last data filed at 04/06/17 0658  Gross per 24 hour  Intake                0 ml  Output              850 ml  Net             -850 ml   Filed Weights   04/02/17 0141 04/03/17 0143 04/04/17 0525  Weight: 86.8 kg (191 lb 5.8 oz) 84.9 kg (187 lb 2.7 oz) 91.5 kg (201 lb 11.5 oz)    Examination:  General exam: Appears calm and comfortable  Respiratory system: Clear to auscultation. Respiratory effort normal. Cardiovascular system: S1 & S2 heard, RRR. No JVD, murmurs, rubs, gallops or clicks. No pedal edema. Gastrointestinal system: Abdomen is distended, soft and nontender. NG tube in place draining bright green bile. No organomegaly or masses felt. Normal bowel sounds heard. Central nervous system: Alert and oriented. No focal neurological deficits. Extremities: Symmetric 5 x 5 power. Skin: No rashes, lesions or ulcers Psychiatry: Judgement and insight appear normal. Mood & affect appropriate.   Data Reviewed: I have personally reviewed  following labs and imaging studies  CBC:  Recent Labs Lab 03/31/17 1417 03/31/17 1633 04/01/17 0530 04/05/17 1545 04/06/17 0552  WBC 1.1* 1.3* 1.6* 5.9 6.4  NEUTROABS 0.6* 0.5* 0.6* 4.0 4.7  HGB 11.9* 11.1* 9.6* 8.3* 7.6*  HCT 34.5* 32.2* 28.1* 24.0* 22.2*  MCV 86.9 87.5 88.4 88.9 89.5  PLT 297 313 210 226 448   Basic Metabolic Panel:  Recent Labs Lab 04/01/17 0530 04/02/17 0800 04/04/17 0500 04/05/17 0600 04/06/17 0552  NA 135 134* 135 134* 137  K 3.7 3.6 3.5 3.5 3.6  CL 97* 97* 98* 98* 102  CO2 30 28 29 28 27   GLUCOSE 90 115* 101* 99 108*  BUN 32* 22* 18 15 14   CREATININE 1.31* 1.10 1.16 1.12 1.07  CALCIUM 7.6* 8.0* 7.9* 7.8* 7.7*   GFR: Estimated Creatinine Clearance: 89.8 mL/min (by C-G formula based on SCr of 1.07 mg/dL). Liver Function Tests:  Recent Labs Lab 03/31/17 1417 03/31/17 1633  AST 11 20  ALT 12 12*  ALKPHOS 71 55  BILITOT 0.34 0.8  PROT 6.2* 5.8*  ALBUMIN 2.4*  2.4*    Recent Labs Lab 03/31/17 1633  LIPASE 11   Coagulation Profile:  Recent Labs Lab 04/01/17 0530  INR 0.91   Cardiac Enzymes: CBG:  Recent Labs Lab 04/02/17 0803 04/03/17 0727 04/04/17 0746 04/05/17 0753 04/06/17 0822  GLUCAP 116* 109* 100* 94 102*    No results found for this or any previous visit (from the past 240 hour(s)).  Radiology Studies: Dg Abd Portable 1v-small Bowel Protocol-position Verification  Result Date: 04/06/2017 CLINICAL DATA:  Check nasogastric catheter placement EXAM: PORTABLE ABDOMEN - 1 VIEW COMPARISON:  04/01/2017 FINDINGS: Scattered large and small bowel gas is noted. Nasogastric catheter is again seen within the stomach and stable. Left basilar atelectasis is noted. Right chest wall port is seen. IMPRESSION: Nasogastric catheter within the stomach. Electronically Signed   By: Inez Catalina M.D.   On: 04/06/2017 07:47   Scheduled Meds: . folic acid  1 mg Intravenous Daily  . pantoprazole (PROTONIX) IV  40 mg Intravenous Q24H    Continuous Infusions: . dextrose 5 % and 0.45 % NaCl with KCl 20 mEq/L 100 mL/hr at 04/06/17 0950  . lidocaine-prilocaine       LOS: 6 days    Time spent: 30 minutes  Hajar Sakhi, PA-Student   If 7PM-7AM, please contact night-coverage [email or web address] Password [email or web address] 04/06/2017, 11:09 AM   Birdie Hopes Pager: [phone] 04/06/2017, 12:45 PM

## 2017-04-06 NOTE — Procedures (Signed)
Ultrasound-guided  therapeutic paracentesis performed yielding 3.7 liters of gelatinous, yellow  fluid. No immediate complications.

## 2017-04-06 NOTE — Progress Notes (Signed)
Accelerated benefit claimant's statement forms received via fax from Shanon Brow with [employer].  Forms signed by Dr. Benay Spice and faxed back to [phone].

## 2017-04-06 NOTE — Progress Notes (Signed)
  Subjective: He reports flatus, no BM.  Dr. Benay Spice has talked with him and he knows they are not recommending surgery currently.  Recommending more chemo first.  His abdomen is soft and he is not distended or tender.    Objective: Vital signs in last 24 hours: Temp:  [98 F (36.7 C)-99.2 F (37.3 C)] 99 F (37.2 C) (04/17 0444) Pulse Rate:  [76-87] 87 (04/17 0444) Resp:  [16-18] 16 (04/17 0444) BP: (111-137)/(68-90) 113/75 (04/17 1130) SpO2:  [98 %-99 %] 99 % (04/17 0444) Last BM Date: 03/27/17 400 urine 450 emesis for yesterday recorded Afebrile, VSS H/H continues to decline, BMP OK Intake/Output from previous day: 04/16 0701 - 04/17 0700 In: -  Out: 850 [Urine:400; Emesis/NG output:450] Intake/Output this shift: No intake/output data recorded.  General appearance: alert, cooperative and no distress GI: soft, non-tender; bowel sounds normal; no masses,  no organomegaly  Lab Results:   Recent Labs  04/05/17 1545 04/06/17 0552  WBC 5.9 6.4  HGB 8.3* 7.6*  HCT 24.0* 22.2*  PLT 226 202    BMET  Recent Labs  04/05/17 0600 04/06/17 0552  NA 134* 137  K 3.5 3.6  CL 98* 102  CO2 28 27  GLUCOSE 99 108*  BUN 15 14  CREATININE 1.12 1.07  CALCIUM 7.8* 7.7*   PT/INR No results for input(s): LABPROT, INR in the last 72 hours.   Recent Labs Lab 03/31/17 1417 03/31/17 1633  AST 11 20  ALT 12 12*  ALKPHOS 71 55  BILITOT 0.34 0.8  PROT 6.2* 5.8*  ALBUMIN 2.4* 2.4*     Lipase     Component Value Date/Time   LIPASE 11 03/31/2017 1633     Studies/Results: Dg Abd Portable 1v-small Bowel Protocol-position Verification  Result Date: 04/06/2017 CLINICAL DATA:  Check nasogastric catheter placement EXAM: PORTABLE ABDOMEN - 1 VIEW COMPARISON:  04/01/2017 FINDINGS: Scattered large and small bowel gas is noted. Nasogastric catheter is again seen within the stomach and stable. Left basilar atelectasis is noted. Right chest wall port is seen. IMPRESSION:  Nasogastric catheter within the stomach. Electronically Signed   By: Inez Catalina M.D.   On: 04/06/2017 07:47    Medications: . bisacodyl  10 mg Rectal Once  . folic acid  1 mg Intravenous Daily  . pantoprazole (PROTONIX) IV  40 mg Intravenous Q24H    Assessment/Plan Peritoneal mesothelioma with carcinomatosis with SBO Malignant ascites IR theraputic paracentesis 04/06/17 Chemo-induced neutropenia Acute kidney disease FEN:  IV fluids/NPO ID: No abx DVT:  SCD only   Plan:  SBP started this AM, will wait for next film.          LOS: 6 days    Bleu Minerd 04/06/2017 [phone]

## 2017-04-07 DIAGNOSIS — D630 Anemia in neoplastic disease: Secondary | ICD-10-CM

## 2017-04-07 DIAGNOSIS — K5669 Other partial intestinal obstruction: Secondary | ICD-10-CM

## 2017-04-07 DIAGNOSIS — D6481 Anemia due to antineoplastic chemotherapy: Secondary | ICD-10-CM

## 2017-04-07 LAB — BASIC METABOLIC PANEL
Anion gap: 6 (ref 5–15)
BUN: 12 mg/dL (ref 6–20)
CHLORIDE: 103 mmol/L (ref 101–111)
CO2: 27 mmol/L (ref 22–32)
CREATININE: 1.07 mg/dL (ref 0.61–1.24)
Calcium: 7.6 mg/dL — ABNORMAL LOW (ref 8.9–10.3)
GFR calc Af Amer: >60 mL/min (ref >60)
Glucose, Bld: 118 mg/dL — ABNORMAL HIGH (ref 65–99)
Potassium: 4 mmol/L (ref 3.5–5.1)
SODIUM: 136 mmol/L (ref 135–145)

## 2017-04-07 LAB — CBC
HCT: 21.3 % — ABNORMAL LOW (ref 39.0–52.0)
HEMOGLOBIN: 7.3 g/dL — AB (ref 13.0–17.0)
MCH: 31.1 pg (ref 26.0–34.0)
MCHC: 34.3 g/dL (ref 30.0–36.0)
MCV: 90.6 fL (ref 78.0–100.0)
PLATELETS: 221 10*3/uL (ref 150–400)
RBC: 2.35 MIL/uL — AB (ref 4.22–5.81)
RDW: 17.2 % — ABNORMAL HIGH (ref 11.5–15.5)
WBC: 5 10*3/uL (ref 4.0–10.5)

## 2017-04-07 MED ORDER — METOCLOPRAMIDE HCL 5 MG/ML IJ SOLN: 5.0000 mg | Freq: Four times a day (QID) | INTRAMUSCULAR | Status: DC | PRN

## 2017-04-07 MED ORDER — BISACODYL 10 MG RE SUPP: 10.0000 mg | Freq: Two times a day (BID) | RECTAL | Status: DC | PRN

## 2017-04-07 MED ORDER — ALUM & MAG HYDROXIDE-SIMETH 200-200-20 MG/5ML PO SUSP: 30.0000 mL | Freq: Four times a day (QID) | ORAL | Status: DC | PRN

## 2017-04-07 MED ORDER — POLYETHYLENE GLYCOL 3350 17 G PO PACK
17.0000 g | PACK | Freq: Every day | ORAL | Status: DC
  Administered 2017-04-08: 17 g via ORAL
  Filled 2017-04-07 (×2): qty 1

## 2017-04-07 MED ORDER — LIP MEDEX EX OINT
1.0000 "application " | TOPICAL_OINTMENT | Freq: Two times a day (BID) | CUTANEOUS | Status: DC
  Administered 2017-04-07 (×2): 1 via TOPICAL
  Filled 2017-04-07: qty 7

## 2017-04-07 MED ORDER — MAGIC MOUTHWASH
15.0000 mL | Freq: Four times a day (QID) | ORAL | Status: DC | PRN
  Filled 2017-04-07: qty 15

## 2017-04-07 MED ORDER — MENTHOL 3 MG MT LOZG: 1.0000 | LOZENGE | OROMUCOSAL | Status: DC | PRN

## 2017-04-07 NOTE — Progress Notes (Signed)
[facility] PROGRESS NOTE  Frank KOPS Sr. 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 Patient underwent his fourth cycle of cisplatin/Alimta on 03/25/2017 and reports developing nausea with vomiting approximately 2 days later. Since that time, his condition has progressively worsened and he reports vomiting every 2-3 hours despite using Compazine, Reglan, and Zofran at home. Over this interval, he has noted a decreased urine output, but denies any fevers or chills. He also notes that his vomitus has become brown in color without clots, red blood, or grit. He denies melena or hematochezia and has not had a bowel movement in several days. Patient also reports an 11 pound weight loss since the onset of his symptoms and has not been eating or drinking much of anything as this worsens his complaints. There are no alleviating factors identified. 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  Assessment/Plan: 1-Partial malignant SBO: -denying SBO, nausea and vomiting -currently reported 2 BM's overnight and again this morning. -Surgical oncologist at [facility], Dr. Johney Maine, was contacted by Dr. Benay Spice and at this time, he does not recommend the patient for surgical intervention due to little response to chemotherapy.  -repeat x-ray suggesting improvement on SBO -will discussed with CCS, but most likely will d/c NGT and advance diet to clear liquid,  2-malignant ascites -patient s/p paracentesis on 4/17 -3.7 L removed -patient currently receiving paracentesis  every 2 weeks  3-Peritoneal mesothelioma with carcinomatosis  -actively follow by oncology (Dr. Learta Codding) and also at [facility] by By Dr. Johney Maine. -minimal response to chemotherapy and per [facility] Doctor rec's, patient will benefit for palliative care at this point. -will follow rec's; but patient essentially declining palliative and will like to continue full spectrum of treatment.  4-Anemia and Neutropenia -chemotherapy induced -will follow trend -continue neutropenic precautions -follow oncology rec's, for transfusion   5-AKI -due to dehydration from GI loses and poor PO intake -will continue IVF's and supportive care -Renal function improved  6-hyponatremia improved and back to normal after IVF's given  7-hypokalemia -will continue repletion as needed  -follow BMET to monitor trend   Code Status: Full Family Communication: no family at bedside  Disposition Plan: will discussed with CCS, but most likely will d/c NGT and advance diet. Per oncology service, plan is to follow up with oncology surgeon at [facility].   Consultants:  General surgery  Oncology service   Procedures:  See below for x-ray reports   Antibiotics:  None   HPI/Subjective: Afebrile, no CP, no nausea, no vomiting and denies SOB. NGT in place, clamped currently. Had BM overnight and again this morning. Still passing gas.  Objective: Vitals:   04/06/17 2041 04/07/17 1415  BP:  126/78  Pulse: 86 86  Resp: 18 16  Temp: 98 F (36.7 C) 98 F (36.7 C)    Intake/Output Summary (Last 24 hours) at 04/07/17 2153 Last data filed at 04/07/17 1415  Gross per 24 hour  Intake              100 ml  Output                0 ml  Net              100 ml   Filed Weights   04/03/17 0143 04/04/17 0525 04/07/17 0107  Weight: 84.9 kg (187 lb 2.7 oz) 91.5 kg (201 lb 11.5 oz) 87.1 kg (192 lb)    Exam:   General: afebrile, no CP, no nausea, no vomiting. Reports BM overnight and again this  morning. NGT in place, clamped currently.  Cardiovascular: S1 and S2, no rubs, no gallops  Respiratory: CTA bilaterally  Abdomen: soft, NT, positive BS, no guarding  Musculoskeletal: no edema, no cyanosis  Data Reviewed: Basic Metabolic Panel:  Recent Labs Lab 04/02/17 0800 04/04/17 0500 04/05/17 0600 04/06/17 0552 04/07/17 0556  NA 134* 135 134* 137 136  K 3.6 3.5 3.5 3.6 4.0  CL 97* 98* 98* 102 103  CO2 28 29 28 27 27   GLUCOSE 115* 101* 99 108* 118*  BUN 22* 18 15 14 12   CREATININE 1.10 1.16 1.12 1.07 1.07  CALCIUM 8.0* 7.9* 7.8* 7.7* 7.6*   CBC:  Recent Labs Lab 04/01/17 0530 04/05/17 1545 04/06/17 0552 04/07/17 0556  WBC 1.6* 5.9 6.4 5.0  NEUTROABS 0.6* 4.0 4.7  --   HGB 9.6* 8.3* 7.6* 7.3*  HCT 28.1* 24.0* 22.2* 21.3*  MCV 88.4 88.9 89.5 90.6  PLT 210 226 202 221   BNP (last 3 results)  Recent Labs  12/16/16 1209  BNP 8.5    CBG:  Recent Labs Lab 04/02/17 0803 04/03/17 0727 04/04/17 0746 04/05/17 0753 04/06/17 0822  GLUCAP 116* 109* 100* 94 102*    Studies: US Paracentesis  Result Date: 04/06/2017 INDICATION: Patient with history of primary peritoneal mesothelioma with abdominal carcinomatosis, recurrent malignant ascites. Request made for therapeutic paracentesis. EXAM: ULTRASOUND GUIDED THERAPEUTIC PARACENTESIS MEDICATIONS: None. COMPLICATIONS: None immediate. PROCEDURE: Informed written consent was obtained from the patient after a discussion of the risks, benefits and alternatives to treatment. A timeout was performed prior to the initiation of the procedure. Initial ultrasound scanning demonstrates a moderate amount of ascites within the left mid to lower abdominal quadrant. The left mid to lower abdomen was prepped and draped in the usual sterile fashion. 1% lidocaine was used for local anesthesia. Following this, a Yueh catheter was introduced. An ultrasound image was saved for documentation purposes. The paracentesis was performed. The  catheter was removed and a dressing was applied. The patient tolerated the procedure well without immediate post procedural complication. FINDINGS: A total of approximately 3.7 liters of gelatinous, yellow fluid was removed. IMPRESSION: Successful ultrasound-guided therapeutic paracentesis yielding 3.7 liters of peritoneal fluid. Read by: Rowe Robert, PA-C Electronically Signed   By: Corrie Mckusick D.O.   On: 04/06/2017 15:45   Dg Abd Portable 1v-small Bowel Obstruction Protocol-initial, 8 Hr Delay  Result Date: 04/06/2017 CLINICAL DATA:  8 hour delay, small bowel obstruction protocol EXAM: PORTABLE ABDOMEN - 1 VIEW COMPARISON:  Earlier same day FINDINGS: Nasogastric tube present within the stomach. Previously administered contrast has passed entirely or nearly entirely into and throughout the colon. No sign of any dilated loops on this single view. IMPRESSION: Contrast passed into and throughout the colon. Electronically Signed   By: Nelson Chimes M.D.   On: 04/06/2017 19:37   Dg Abd Portable 1v-small Bowel Protocol-position Verification  Result Date: 04/06/2017 CLINICAL DATA:  Check nasogastric catheter placement EXAM: PORTABLE ABDOMEN - 1 VIEW COMPARISON:  04/01/2017 FINDINGS: Scattered large and small bowel gas is noted. Nasogastric catheter is again seen within the stomach and stable. Left  basilar atelectasis is noted. Right chest wall port is seen. IMPRESSION: Nasogastric catheter within the stomach. Electronically Signed   By: Inez Catalina M.D.   On: 04/06/2017 07:47    Scheduled Meds: . folic acid  1 mg Intravenous Daily  . lip balm  1 application Topical BID  . pantoprazole (PROTONIX) IV  40 mg Intravenous Q24H  . polyethylene glycol  17 g Oral Daily   Continuous Infusions: . dextrose 5 % and 0.45% NaCl 1,000 mL with potassium chloride 40 mEq infusion 50 mL/hr at 04/07/17 1100  . lidocaine-prilocaine      Principal Problem:   Malignant Bowel Obstruction Active Problems:   Peritoneal  carcinomatosis ([place])   Malignant ascites   Mesothelioma of peritoneum ([place])   Nausea with vomiting   Dehydration   Chemotherapy induced neutropenia ([facility])   Hyponatremia   AKI (acute kidney injury) ([place])    Time spent: 25 minutes    Barton Dubois  [facility] Pager [phone]. If 7PM-7AM, please contact night-coverage at [email or web address], password [facility] 04/07/2017, 9:53 PM  LOS: 7 days

## 2017-04-07 NOTE — Progress Notes (Signed)
Subjective: He feels better, 3 Bm's since yesterday.  Contrast in colon from SB protocol.    Objective: Vital signs in last 24 hours: Temp:  [98 F (36.7 C)] 98 F (36.7 C) (04/17 2041) Pulse Rate:  [86-90] 86 (04/17 2041) Resp:  [18] 18 (04/17 2041) BP: (123-126)/(70-79) 123/79 (04/17 2041) SpO2:  [96 %-98 %] 98 % (04/17 2041) Weight:  [87.1 kg (192 lb)] 87.1 kg (192 lb) (04/18 0107) Last BM Date: 03/27/17 500 from the NG BM x 1  Afebrile, VSS Labs OK SB protocol shows good deal of contrast in colon Intake/Output from previous day: 04/17 0701 - 04/18 0700 In: -  Out: 700 [Urine:200; Emesis/NG output:500] Intake/Output this shift: No intake/output data recorded.  General appearance: alert, cooperative and no distress GI: soft, non-tender; bowel sounds normal; no masses,  no organomegaly  Lab Results:   Recent Labs  04/06/17 0552 04/07/17 0556  WBC 6.4 5.0  HGB 7.6* 7.3*  HCT 22.2* 21.3*  PLT 202 221    BMET  Recent Labs  04/06/17 0552 04/07/17 0556  NA 137 136  K 3.6 4.0  CL 102 103  CO2 27 27  GLUCOSE 108* 118*  BUN 14 12  CREATININE 1.07 1.07  CALCIUM 7.7* 7.6*   PT/INR No results for input(s): LABPROT, INR in the last 72 hours.   Recent Labs Lab 03/31/17 1417 03/31/17 1633  AST 11 20  ALT 12 12*  ALKPHOS 71 55  BILITOT 0.34 0.8  PROT 6.2* 5.8*  ALBUMIN 2.4* 2.4*     Lipase     Component Value Date/Time   LIPASE 11 03/31/2017 1633     Studies/Results: US Paracentesis  Result Date: 04/06/2017 INDICATION: Patient with history of primary peritoneal mesothelioma with abdominal carcinomatosis, recurrent malignant ascites. Request made for therapeutic paracentesis. EXAM: ULTRASOUND GUIDED THERAPEUTIC PARACENTESIS MEDICATIONS: None. COMPLICATIONS: None immediate. PROCEDURE: Informed written consent was obtained from the patient after a discussion of the risks, benefits and alternatives to treatment. A timeout was performed prior to the  initiation of the procedure. Initial ultrasound scanning demonstrates a moderate amount of ascites within the left mid to lower abdominal quadrant. The left mid to lower abdomen was prepped and draped in the usual sterile fashion. 1% lidocaine was used for local anesthesia. Following this, a Yueh catheter was introduced. An ultrasound image was saved for documentation purposes. The paracentesis was performed. The catheter was removed and a dressing was applied. The patient tolerated the procedure well without immediate post procedural complication. FINDINGS: A total of approximately 3.7 liters of gelatinous, yellow fluid was removed. IMPRESSION: Successful ultrasound-guided therapeutic paracentesis yielding 3.7 liters of peritoneal fluid. Read by: Rowe Robert, PA-C Electronically Signed   By: Corrie Mckusick D.O.   On: 04/06/2017 15:45   Dg Abd Portable 1v-small Bowel Obstruction Protocol-initial, 8 Hr Delay  Result Date: 04/06/2017 CLINICAL DATA:  8 hour delay, small bowel obstruction protocol EXAM: PORTABLE ABDOMEN - 1 VIEW COMPARISON:  Earlier same day FINDINGS: Nasogastric tube present within the stomach. Previously administered contrast has passed entirely or nearly entirely into and throughout the colon. No sign of any dilated loops on this single view. IMPRESSION: Contrast passed into and throughout the colon. Electronically Signed   By: Nelson Chimes M.D.   On: 04/06/2017 19:37   Dg Abd Portable 1v-small Bowel Protocol-position Verification  Result Date: 04/06/2017 CLINICAL DATA:  Check nasogastric catheter placement EXAM: PORTABLE ABDOMEN - 1 VIEW COMPARISON:  04/01/2017 FINDINGS: Scattered large and small bowel gas  is noted. Nasogastric catheter is again seen within the stomach and stable. Left basilar atelectasis is noted. Right chest wall port is seen. IMPRESSION: Nasogastric catheter within the stomach. Electronically Signed   By: Inez Catalina M.D.   On: 04/06/2017 07:47    Medications: .  folic acid  1 mg Intravenous Daily  . lip balm  1 application Topical BID  . pantoprazole (PROTONIX) IV  40 mg Intravenous Q24H  . polyethylene glycol  17 g Oral Daily    Assessment/Plan Peritoneal mesothelioma with carcinomatosis with SBO Malignant ascites IR theraputic paracentesis 04/06/17 Chemo-induced neutropenia Acute kidney disease FEN: IV fluids/NPO ID: No abx DVT: SCD only  PlaN :  D/c NG and start clears; see how he does.       LOS: 7 days    Awa Bachicha 04/07/2017 [phone]

## 2017-04-07 NOTE — Progress Notes (Signed)
IP PROGRESS NOTE  Subjective:   Frank Tucker had 2 bowel movements. No nausea or pain at present. He underwent a paracentesis for 3.7 L of fluid yesterday.  Objective: Vital signs in last 24 hours: Blood pressure 123/79, pulse 86, temperature 98 F (36.7 C), temperature source Oral, resp. rate 18, height 5\' 10"  (1.778 m), weight 192 lb (87.1 kg), SpO2 98 %.  Intake/Output from previous day: 04/17 0701 - 04/18 0700 In: -  Out: 700 [Urine:200; Emesis/NG output:500]  Physical Exam:  HEENT: No thrush Lungs: Clear bilaterally Cardiac: Regular rate and rhythm Abdomen: Mildly distended, no mass Extremities: Trace low pretibial edema bilaterally   Portacath/PICC-without erythema  Lab Results: BMET  Recent Labs  04/06/17 0552 04/07/17 0556  NA 137 136  K 3.6 4.0  CL 102 103  CO2 27 27  GLUCOSE 108* 118*  BUN 14 12  CREATININE 1.07 1.07  CALCIUM 7.7* 7.6*    Studies/Results: US Paracentesis  Result Date: 04/06/2017 INDICATION: Patient with history of primary peritoneal mesothelioma with abdominal carcinomatosis, recurrent malignant ascites. Request made for therapeutic paracentesis. EXAM: ULTRASOUND GUIDED THERAPEUTIC PARACENTESIS MEDICATIONS: None. COMPLICATIONS: None immediate. PROCEDURE: Informed written consent was obtained from the patient after a discussion of the risks, benefits and alternatives to treatment. A timeout was performed prior to the initiation of the procedure. Initial ultrasound scanning demonstrates a moderate amount of ascites within the left mid to lower abdominal quadrant. The left mid to lower abdomen was prepped and draped in the usual sterile fashion. 1% lidocaine was used for local anesthesia. Following this, a Yueh catheter was introduced. An ultrasound image was saved for documentation purposes. The paracentesis was performed. The catheter was removed and a dressing was applied. The patient tolerated the procedure well without immediate post  procedural complication. FINDINGS: A total of approximately 3.7 liters of gelatinous, yellow fluid was removed. IMPRESSION: Successful ultrasound-guided therapeutic paracentesis yielding 3.7 liters of peritoneal fluid. Read by: Rowe Robert, PA-C Electronically Signed   By: Corrie Mckusick D.O.   On: 04/06/2017 15:45   Dg Abd Portable 1v-small Bowel Obstruction Protocol-initial, 8 Hr Delay  Result Date: 04/06/2017 CLINICAL DATA:  8 hour delay, small bowel obstruction protocol EXAM: PORTABLE ABDOMEN - 1 VIEW COMPARISON:  Earlier same day FINDINGS: Nasogastric tube present within the stomach. Previously administered contrast has passed entirely or nearly entirely into and throughout the colon. No sign of any dilated loops on this single view. IMPRESSION: Contrast passed into and throughout the colon. Electronically Signed   By: Nelson Chimes M.D.   On: 04/06/2017 19:37   Dg Abd Portable 1v-small Bowel Protocol-position Verification  Result Date: 04/06/2017 CLINICAL DATA:  Check nasogastric catheter placement EXAM: PORTABLE ABDOMEN - 1 VIEW COMPARISON:  04/01/2017 FINDINGS: Scattered large and small bowel gas is noted. Nasogastric catheter is again seen within the stomach and stable. Left basilar atelectasis is noted. Right chest wall port is seen. IMPRESSION: Nasogastric catheter within the stomach. Electronically Signed   By: Inez Catalina M.D.   On: 04/06/2017 07:47    Medications: I have reviewed the patient's current medications.  Assessment/Plan:  1. Abdominal carcinomatosis-primary peritoneal mesothelioma ? Paracentesis on 12/17/2016 confirmed malignant cells consistent with carcinoma, cytokeratin and WT-1 positive ? CTs of the chest, abdomen, and pelvis with ascites and omental caking. No other evidence of metastatic disease and no primary tumor site identified ? Upper endoscopy 12/29/2016-negative for malignancy ? PET scan 01/01/2017 with extensive hypermetabolic omental caking of tumor with  extensive ascites, no primary  tumor site identified ? 01/08/2017 omental mass biopsy-malignant mesothelioma ? Cycle 1 cisplatin/Alimta 01/21/2017 ? Cycle 2 cisplatin/Alimta 02/11/2017 ? Cycle 3 cisplatin/Alimta 03/04/2017 ? Cycle 4 cisplatin/Alimta 03/25/2017 ? CT 04/01/2017-persistent ascites and omental thickening/peritoneal nodularity-Slightly improved, new small bowel obstruction   2. Yeast rash in the axilla and groin.  3. Port-A-Cath placement 01/20/2017  4. Delayed nauseasecondary to chemotherapy-persistent following cycle 2 despite Decadron prophylaxis, improved with cycles 3 with the addition of Reglan  5. Malignant ascites             6.   Elevated creatinine-likely secondary to dehydration versus toxicity from cisplatin             7.   Neutropenia secondary to chemotherapy-resolved             8.   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 He is now having bowel movements. I discussed the case with Dr. Johney Maine. The plan is to remove the NG tube and advanced to a liquid diet. I discussed treatment plans with Mr. Kretschmer. I doubt he will benefit from further Alimta/cisplatin. I will contact Dr. Johney Maine to discuss Mr. Tuller current status and surgical options.  Recommendations:  1. Remove NG tube and advanced diet as recommended by surgery 2. Transfuse packed red blood cells for symptomatic anemia 3. I will contact Dr.Votanopoulos at [facility] to schedule an appointment for as soon as possible    LOS: 7 days   Betsy Coder, MD   04/07/2017, 2:16 PM

## 2017-04-08 LAB — BASIC METABOLIC PANEL
Anion gap: 8 (ref 5–15)
BUN: 10 mg/dL (ref 6–20)
CALCIUM: 7.8 mg/dL — AB (ref 8.9–10.3)
CO2: 26 mmol/L (ref 22–32)
CREATININE: 1.13 mg/dL (ref 0.61–1.24)
Chloride: 102 mmol/L (ref 101–111)
GFR calc Af Amer: >60 mL/min (ref >60)
GLUCOSE: 110 mg/dL — AB (ref 65–99)
Potassium: 4.6 mmol/L (ref 3.5–5.1)
SODIUM: 136 mmol/L (ref 135–145)

## 2017-04-08 LAB — CBC
HEMATOCRIT: 26.1 % — AB (ref 39.0–52.0)
Hemoglobin: 8.5 g/dL — ABNORMAL LOW (ref 13.0–17.0)
MCH: 29.1 pg (ref 26.0–34.0)
MCHC: 32.6 g/dL (ref 30.0–36.0)
MCV: 89.4 fL (ref 78.0–100.0)
PLATELETS: 344 10*3/uL (ref 150–400)
RBC: 2.92 MIL/uL — ABNORMAL LOW (ref 4.22–5.81)
RDW: 17.7 % — AB (ref 11.5–15.5)
WBC: 4.8 10*3/uL (ref 4.0–10.5)

## 2017-04-08 LAB — GLUCOSE, CAPILLARY: GLUCOSE-CAPILLARY: 100 mg/dL — AB (ref 65–99)

## 2017-04-08 LAB — MAGNESIUM: MAGNESIUM: 1.1 mg/dL — AB (ref 1.7–2.4)

## 2017-04-08 MED ORDER — POLYETHYLENE GLYCOL 3350 17 G PO PACK: 17.0000 g | PACK | Freq: Every day | ORAL | 0 refills | Status: AC

## 2017-04-08 MED ORDER — BISACODYL 10 MG RE SUPP: 10.0000 mg | Freq: Two times a day (BID) | RECTAL | 0 refills | Status: AC | PRN

## 2017-04-08 MED ORDER — FAMOTIDINE 20 MG PO TABS
40.0000 mg | ORAL_TABLET | Freq: Every day | ORAL | Status: DC
Start: 2017-04-08 — End: 2017-04-20

## 2017-04-08 MED ORDER — PANTOPRAZOLE SODIUM 40 MG PO TBEC: 40.0000 mg | DELAYED_RELEASE_TABLET | Freq: Two times a day (BID) | ORAL | 1 refills | Status: AC

## 2017-04-08 MED ORDER — HEPARIN SOD (PORK) LOCK FLUSH 100 UNIT/ML IV SOLN
500.0000 [IU] | Freq: Once | INTRAVENOUS | Status: DC
  Filled 2017-04-08: qty 5

## 2017-04-08 MED ORDER — MAGNESIUM OXIDE 400 MG PO TABS: 400.0000 mg | ORAL_TABLET | Freq: Every day | ORAL | 1 refills | Status: DC

## 2017-04-08 MED ORDER — ACETAMINOPHEN 325 MG PO TABS: 650.0000 mg | ORAL_TABLET | Freq: Four times a day (QID) | ORAL | 0 refills | Status: AC | PRN

## 2017-04-08 MED ORDER — BOOST HIGH PROTEIN PO LIQD: 1.0000 | Freq: Three times a day (TID) | ORAL | 0 refills | Status: DC

## 2017-04-08 NOTE — Progress Notes (Signed)
Subjective: CC:  SBO  He had allot at one point and got nauseated last PM  Seems to be doing well, + BM's.   Objective: Vital signs in last 24 hours: Temp:  [98 F (36.7 C)-98.1 F (36.7 C)] 98 F (36.7 C) (04/19 0547) Pulse Rate:  [79-89] 89 (04/19 0547) Resp:  [16-20] 20 (04/19 0547) BP: (118-126)/(78-81) 118/80 (04/19 0547) SpO2:  [98 %-100 %] 99 % (04/19 0547) Last BM Date: 04/07/17   3000 IV PO not recorded BM x 1 recorded, he told me he had 3 stools yesterday when I saw him. Afebrile,  VSS Labs OK  Intake/Output from previous day: 04/18 0701 - 04/19 0700 In: 2963.3 [I.V.:2863.3] Out: -  Intake/Output this shift: No intake/output data recorded.  General appearance: alert, cooperative and no distress GI: soft, non-tender; bowel sounds normal; no masses,  no organomegaly  Lab Results:   Recent Labs  04/07/17 0556 04/08/17 0528  WBC 5.0 4.8  HGB 7.3* 8.5*  HCT 21.3* 26.1*  PLT 221 344    BMET  Recent Labs  04/07/17 0556 04/08/17 0528  NA 136 136  K 4.0 4.6  CL 103 102  CO2 27 26  GLUCOSE 118* 110*  BUN 12 10  CREATININE 1.07 1.13  CALCIUM 7.6* 7.8*   PT/INR No results for input(s): LABPROT, INR in the last 72 hours.  No results for input(s): AST, ALT, ALKPHOS, BILITOT, PROT, ALBUMIN in the last 168 hours.   Lipase     Component Value Date/Time   LIPASE 11 03/31/2017 1633     Studies/Results: US Paracentesis  Result Date: 04/06/2017 INDICATION: Patient with history of primary peritoneal mesothelioma with abdominal carcinomatosis, recurrent malignant ascites. Request made for therapeutic paracentesis. EXAM: ULTRASOUND GUIDED THERAPEUTIC PARACENTESIS MEDICATIONS: None. COMPLICATIONS: None immediate. PROCEDURE: Informed written consent was obtained from the patient after a discussion of the risks, benefits and alternatives to treatment. A timeout was performed prior to the initiation of the procedure. Initial ultrasound scanning  demonstrates a moderate amount of ascites within the left mid to lower abdominal quadrant. The left mid to lower abdomen was prepped and draped in the usual sterile fashion. 1% lidocaine was used for local anesthesia. Following this, a Yueh catheter was introduced. An ultrasound image was saved for documentation purposes. The paracentesis was performed. The catheter was removed and a dressing was applied. The patient tolerated the procedure well without immediate post procedural complication. FINDINGS: A total of approximately 3.7 liters of gelatinous, yellow fluid was removed. IMPRESSION: Successful ultrasound-guided therapeutic paracentesis yielding 3.7 liters of peritoneal fluid. Read by: Rowe Robert, PA-C Electronically Signed   By: Corrie Mckusick D.O.   On: 04/06/2017 15:45   Dg Abd Portable 1v-small Bowel Obstruction Protocol-initial, 8 Hr Delay  Result Date: 04/06/2017 CLINICAL DATA:  8 hour delay, small bowel obstruction protocol EXAM: PORTABLE ABDOMEN - 1 VIEW COMPARISON:  Earlier same day FINDINGS: Nasogastric tube present within the stomach. Previously administered contrast has passed entirely or nearly entirely into and throughout the colon. No sign of any dilated loops on this single view. IMPRESSION: Contrast passed into and throughout the colon. Electronically Signed   By: Nelson Chimes M.D.   On: 04/06/2017 19:37    Medications: . folic acid  1 mg Intravenous Daily  . lip balm  1 application Topical BID  . pantoprazole (PROTONIX) IV  40 mg Intravenous Q24H  . polyethylene glycol  17 g Oral Daily   . dextrose 5 % and 0.45% NaCl 1,000  mL with potassium chloride 40 mEq infusion 50 mL/hr at 04/07/17 2231  . lidocaine-prilocaine      Assessment/Plan Peritoneal mesothelioma with carcinomatosis with SBO Malignant ascites IR theraputic paracentesis 04/06/17 Chemo-induced neutropenia Acute kidney disease FEN: IV fluids/Clears liquids =>> full liquids ID: No abx DVT: SCD  only   Plan:  Full liquids and see how he does.     LOS: 8 days    Jhoselyn Ruffini 04/08/2017 [phone]

## 2017-04-08 NOTE — Progress Notes (Signed)
Nutrition Follow-up  DOCUMENTATION CODES:   Severe malnutrition in context of acute illness/injury  INTERVENTION:   Monitor magnesium, potassium, and phosphorus daily for at least 3 days, MD to replete as needed, as pt is at risk for refeeding syndrome given severe malnutrition, poor PO intake > 7 days and low Mg levels.  Diet advancement per MD Will monitor for PO intake and needs   If G-tube is placed: Recommend initiating Osmolite 1.5 @ 15 ml/hr and advance every 12 hours to goal rate of 65 ml/hr. 30 ml Prostat daily. This provides 2440 kcal and 112g protein.  NUTRITION DIAGNOSIS:   Malnutrition related to acute illness (PSBO) as evidenced by percent weight loss, energy intake < or equal to 50% for > or equal to 5 days.  Ongoing.  GOAL:   Patient will meet greater than or equal to 90% of their needs  Not meeting.  MONITOR:   PO intake, Labs, Weight trends, I & O's  ASSESSMENT:   56 y.o. 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 Patient underwent his fourth cycle of cisplatin/Alimta on 03/25/2017 and reports developing nausea with vomiting approximately 2 days later. Since that time, his condition has progressively worsened and he reports vomiting every 2-3 hours despite using Compazine, Reglan, and Zofran at home. Over this interval, he has noted a decreased urine output, but denies any fevers or chills. He also notes that his vomitus has become brown in color without clots, red blood, or grit. He denies melena or hematochezia and has not had a bowel movement in several days. Patient also reports an 11 pound weight loss since the onset of his symptoms and has not been eating or drinking much of anything as this worsens his complaints.   Pt with  diet just advanced to clear liquids. Has been NPO x 7 days now with poor PO intakes PTA. Per surgery note, SBO resolved per x-ray. If tolerates clear liquids, will order Boost Breeze supplements. Pt may need nutrition support if unable to tolerate PO intake. Tube feeding recommendations provided above.  Medications: IV folic acid daily, IV Protonix daily, Miralax packet daily, IV Zofran PRN Labs reviewed: CBGs: 100 Low Mg  Diet Order:  Diet clear liquid Room service appropriate? Yes; Fluid consistency: Thin  Skin:  Reviewed, no issues  Last BM:  4/19  Height:   Ht Readings from Last 1 Encounters:  03/31/17 5\' 10"  (1.778 m)    Weight:   Wt Readings from Last 1 Encounters:  04/07/17 192 lb (87.1 kg)    Ideal Body Weight:  75.5 kg  BMI:  Body mass index is 27.55 kg/m.  Estimated Nutritional Needs:   Kcal:  2694-8546  Protein:  115-125g  Fluid:  2L/day  EDUCATION NEEDS:   No education needs identified at this time  Clayton Bibles, MS, RD, LDN Pager: [phone] After Hours Pager: [phone]

## 2017-04-08 NOTE — Discharge Summary (Signed)
Physician Discharge Summary  Frank CENTRELLA Sr. JOA:[id number] DOB: 09/23/1961 DOA: 03/31/2017  PCP: No PCP Per Patient  Admit date: 03/31/2017 Discharge date: 04/08/2017  Time spent: 35 minutes  Recommendations for Outpatient Follow-up:  1. Repeat BMET and Mg level to follow electrolytes and renal function  2. Repeat CBC to follow pancytopenia    Discharge Diagnoses:  Principal Problem:   Malignant Bowel Obstruction Active Problems:   Peritoneal carcinomatosis ([place])   Malignant ascites   Mesothelioma of peritoneum ([place])   Nausea with vomiting   Dehydration   Chemotherapy induced neutropenia (HCC)   Hyponatremia   AKI (acute kidney injury) ([place])   Discharge Condition: stable and improved. Patient discharge home with instructions to follow up full liquid diet/puree diet until follow up with oncology surgeon. Also instructed to follow feeding supplements.  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 Patient underwent his fourth cycle of cisplatin/Alimta on 03/25/2017 and reports developing nausea with vomiting approximately 2 days later. Since that time, his condition has progressively worsened and he reports vomiting every 2-3 hours despite using Compazine, Reglan, and Zofran at home. Over this interval, he has noted a decreased urine output, but denies any fevers or chills. He also notes that his vomitus has become brown in color without clots, red blood, or grit. He denies melena or hematochezia and has not had a  bowel movement in several days. Patient also reports an 11 pound weight loss since the onset of his symptoms and has not been eating or drinking much of anything as this worsens his complaints. There are no alleviating factors identified. 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  Hospital Course:  1-Partial malignant SBO: -denying SOB, nausea and vomiting -continue to pass gas and have 2 more BM's overnight and 1 this morning.  -Surgical oncologist at [facility], Dr. Johney Maine, was contacted by Dr. Benay Spice and at this time, he does not recommend the patient for surgical intervention due to little response to chemotherapy.  -repeat x-ray suggesting improvement/resolution of SBO -will discharge with instructions to follow up full liquid diet/puree diet at the max and to use PRN laxatives. -follow up with oncology surgery as an outpatient for definitive decision of treatment/intervention.  2-malignant ascites -Patient s/p paracentesis on 4/17 -3.7 L removed -patient currently receiving paracentesis every 2 weeks -abd soft and no significant ascites seen   3-Peritoneal mesothelioma with carcinomatosis  -actively follow by oncology (Dr. Learta Codding) and also at [facility] by By Dr. Johney Maine. -minimal response to chemotherapy and per [facility] Doctor rec's, patient will benefit for palliative care at this point. -will follow rec's; but patient essentially declining palliative and will like to continue full spectrum of treatment. Needs face to face discussion with oncology surgeon.  4-Anemia and Neutropenia -chemotherapy induced -will recommend CBC to follow trend -follow oncology rec's, for transfusion  -Hgb at discharge 8.5  5-AKI -due to dehydration from GI loses and poor PO intake -resolved with IVF's -advise to maintained  good hydration   6-hyponatremia improved and back to  normal after IVF's given -repeat BMET at follow up to check on electrolytes trend   7-hypokalemia -follow BMET to monitor trend -Repleted   8-severe protein calorie malnutrition  -advise to follow feeding supplements   Procedures:  Paracentesis   See below for x-ray reports   Consultations:  Oncology  IR  CCS  Discharge Exam: Vitals:   04/08/17 0547 04/08/17 1406  BP: 118/80 105/77  Pulse: 89 80  Resp: 20 18  Temp: 98 F (36.7 C) 98.4 F (36.9 C)    General: afebrile, no CP, no nausea, no vomiting. Reports to continue having BM's and passing gas.  Cardiovascular: S1 and S2, no rubs, no gallops  Respiratory: CTA bilaterally  Abdomen: soft, no guarding, positive BS, no significant ascites appreciated  Musculoskeletal: no edema, no cyanosis  Discharge Instructions   Discharge Instructions    Discharge instructions    Complete by:  As directed    Keep yourself well hydrated and take medications as prescribed  Maintain full liquid diet to puree diet (maximum consistency), at least until follow up with oncologist surgeon. Use feeding supplements Follow up with oncology service as previously scheduled     Current Discharge Medication List    START taking these medications   Details  acetaminophen (TYLENOL) 325 MG tablet Take 2 tablets (650 mg total) by mouth every 6 (six) hours as needed for mild pain or headache (or Fever >/= 101). Qty: 35 tablet, Refills: 0    bisacodyl (DULCOLAX) 10 MG suppository Place 1 suppository (10 mg total) rectally every 12 (twelve) hours as needed for mild constipation or moderate constipation. Qty: 12 suppository, Refills: 0    feeding supplement (BOOST HIGH PROTEIN) LIQD Take 237 mLs by mouth 3 (three) times daily between meals. Qty: 21330 mL, Refills: 0    magnesium oxide (MAG-OX) 400 MG tablet Take 1 tablet (400 mg total) by mouth daily. Qty: 30 tablet, Refills: 1    pantoprazole (PROTONIX) 40 MG tablet Take 1 tablet  (40 mg total) by mouth 2 (two) times daily. Qty: 60 tablet, Refills: 1    polyethylene glycol (MIRALAX / GLYCOLAX) packet Take 17 g by mouth daily. Qty: 14 each, Refills: 0      CONTINUE these medications which have CHANGED   Details  famotidine (PEPCID) 20 MG tablet Take 2 tablets (40 mg total) by mouth at bedtime.   Associated Diagnoses: Peritoneal carcinomatosis ([place])      CONTINUE these medications which have NOT CHANGED   Details  dexamethasone (DECADRON) 4 MG tablet Take 1 tablet (4 mg total) by mouth 2 (two) times daily. For 3 days. Begin day after chemo. Qty: 6 tablet, Refills: 1   Associated Diagnoses: Mesothelioma of peritoneum ([place])    folic acid (FOLVITE) 1 MG tablet Take 1 tablet (1 mg total) by mouth daily. Qty: 30 tablet, Refills: 4   Associated Diagnoses: Mesothelioma of peritoneum ([place])    lidocaine-prilocaine (EMLA) cream Apply to portacath site 1 hour prior to use. Qty: 30 g, Refills: 0   Associated Diagnoses: Mesothelioma of peritoneum (HCC)    LORazepam (ATIVAN) 1 MG tablet Take 1 tablet (1 mg total) by mouth every 6 (six) hours as needed (nausea). DO NOT DRIVE Qty: 30 tablet, Refills: 0    metoCLOPramide (REGLAN) 10 MG tablet Take 1 tablet (10 mg total) by mouth 3 (three) times daily before meals. DO NOT TAKE COMPAZINE. Qty: 90 tablet, Refills: 0  nystatin (MYCOSTATIN/NYSTOP) powder Apply topically 2 (two) times daily. To under arm rash Qty: 45 g, Refills: 1   Associated Diagnoses: Peritoneal carcinomatosis (HCC)    ondansetron (ZOFRAN) 8 MG tablet Take 1 tablet (8 mg total) by mouth every 8 (eight) hours as needed for nausea or vomiting. Begin 3 days after chemo. Qty: 20 tablet, Refills: 2   Associated Diagnoses: Mesothelioma of peritoneum ([place]); Peritoneal carcinomatosis ([place])    oxyCODONE-acetaminophen (PERCOCET/ROXICET) 5-325 MG tablet Take 1 tablet by mouth every 4 (four) hours as needed for severe pain. Qty: 60 tablet, Refills: 0   Associated  Diagnoses: Peritoneal carcinomatosis ([place])    Probiotic Product (PROBIOTIC PO) Take 1 tablet by mouth daily.      STOP taking these medications     prochlorperazine (COMPAZINE) 10 MG tablet        No Known Allergies Follow-up Information    Betsy Coder, MD Follow up today.   Specialty:  Oncology Why:  contact office for follow up appointment details. Contact information: [place] [address] [phone]           The results of significant diagnostics from this hospitalization (including imaging, microbiology, ancillary and laboratory) are listed below for reference.    Significant Diagnostic Studies: Dg Abd 1 View  Result Date: 04/01/2017 CLINICAL DATA:  Nasogastric tube placement. Generalized abdominal distention. Initial encounter. EXAM: ABDOMEN - 1 VIEW COMPARISON:  CT of the abdomen and pelvis performed 03/31/2017 FINDINGS: The patient's enteric tube is noted ending overlying the body of the stomach, with the side port about the gastroesophageal junction. This could be advanced perhaps 3 cm. Distended small bowel loops are again noted, concerning for partial small bowel obstruction. No free intra-abdominal air is seen. Mild left basilar atelectasis is noted. A right-sided chest port is noted ending about the distal SVC. IMPRESSION: 1. Enteric tube noted ending overlying the body of the stomach, with the side port about the gastroesophageal junction. This could be advanced perhaps 3 cm. 2. Distended small bowel loops again seen, concerning for partial small bowel obstruction. No free intra-abdominal air seen. 3. Mild left basilar atelectasis noted. Electronically Signed   By: Garald Balding M.D.   On: 04/01/2017 06:04   Ct Abdomen Pelvis W Contrast  Result Date: 03/31/2017 CLINICAL DATA:  Cainan Trull a 41HK male tells [place] that he had had few episodes of emesis per day for several days now. He has had recent chemo for peritoneal cancer; and that  he has lost >10 lbs. Recently. He further states he has frequent par.*comment was truncated*^30mL ISOVUE-300 IOPAMIDOL (ISOVUE-300) INJECTION 61%, 10mL ISOVUE-300 IOPAMIDOL (ISOVUE-300) INJECTION 61% EXAM: CT ABDOMEN AND PELVIS WITH CONTRAST TECHNIQUE: Multidetector CT imaging of the abdomen and pelvis was performed using the standard protocol following bolus administration of intravenous contrast. CONTRAST:  41mL ISOVUE-300 IOPAMIDOL (ISOVUE-300) INJECTION 61%, 11mL ISOVUE-300 IOPAMIDOL (ISOVUE-300) INJECTION 61% COMPARISON:  CT abdomen pelvis 01/08/2018, PET-CT 01/01/2018 FINDINGS: Lower chest: Lung bases are clear. Hepatobiliary: Moderate volume intraperitoneal free fluid surrounds the liver margin and decreased in volume from in exam of 01/08/2017. No focal hepatic lesion. Gallbladder appears normal. Pancreas: Pancreas is normal. No ductal dilatation. No pancreatic inflammation. Spleen: Normal spleen Adrenals/urinary tract: Adrenal glands and kidneys are normal. The ureters and bladder normal. Stomach/Bowel: The stomach duodenum are normal. There is fluid within the duodenum but no distention. There is some distention of the LEFT upper quadrant proximal small bowel up to 4 cm. The dilated small bowel is fluid-filled.  The more distal small bowel is collapsed measuring approximately 2 cm on average. There is no clear transition point. Moderate volume of free fluid is interspersed within the leaves of the mesentery. Again noted extensive omental thickening along the ventral omental surface and peritoneal surface. Frank nodularity within the the fluid along the RIGHT pericolic gutter (image 47, series 2) The more distal colon is chronic collapse collapse. There is some stool in the RIGHT colon LEFT colon and sigmoid colon Vascular/Lymphatic: Abdominal aorta is normal caliber. There is no retroperitoneal or periportal lymphadenopathy. No pelvic lymphadenopathy. Reproductive: Prostate normal Other: Large volume of  free fluid as described in the bowel section. Omental nodularity and peritoneal nodularity described in bowel section Musculoskeletal: No aggressive osseous lesion. IMPRESSION: 1. Transition from dilated proximal small bowel to more decompressed distal small bowel consistent with a partial small bowel obstruction or developing mechanical obstruction. Peritoneal carcinomatosis is presumably the source of the obstructive pattern. 2. Persistent omental thickening and peritoneal nodularity as well as extensive peritoneal free fluid consistent with known carcinomatosis / mesothelioma . 3. Moderate volume intraperitoneal free fluid similar to comparison exams. Electronically Signed   By: Suzy Bouchard M.D.   On: 03/31/2017 18:18   US Paracentesis  Result Date: 04/06/2017 INDICATION: Patient with history of primary peritoneal mesothelioma with abdominal carcinomatosis, recurrent malignant ascites. Request made for therapeutic paracentesis. EXAM: ULTRASOUND GUIDED THERAPEUTIC PARACENTESIS MEDICATIONS: None. COMPLICATIONS: None immediate. PROCEDURE: Informed written consent was obtained from the patient after a discussion of the risks, benefits and alternatives to treatment. A timeout was performed prior to the initiation of the procedure. Initial ultrasound scanning demonstrates a moderate amount of ascites within the left mid to lower abdominal quadrant. The left mid to lower abdomen was prepped and draped in the usual sterile fashion. 1% lidocaine was used for local anesthesia. Following this, a Yueh catheter was introduced. An ultrasound image was saved for documentation purposes. The paracentesis was performed. The catheter was removed and a dressing was applied. The patient tolerated the procedure well without immediate post procedural complication. FINDINGS: A total of approximately 3.7 liters of gelatinous, yellow fluid was removed. IMPRESSION: Successful ultrasound-guided therapeutic paracentesis yielding 3.7  liters of peritoneal fluid. Read by: Rowe Robert, PA-C Electronically Signed   By: Corrie Mckusick D.O.   On: 04/06/2017 15:45   US Paracentesis  Result Date: 03/24/2017 INDICATION: Patient with history of mesothelioma of the peritoneum, recurrent ascites. Request is made for therapeutic paracentesis. EXAM: ULTRASOUND GUIDED THERAPEUTIC PARACENTESIS MEDICATIONS: 10 mL 1% lidocaine COMPLICATIONS: None immediate. PROCEDURE: Informed written consent was obtained from the patient after a discussion of the risks, benefits and alternatives to treatment. A timeout was performed prior to the initiation of the procedure. Initial ultrasound scanning demonstrates a large amount of ascites within the right lateral abdomen. The right lateral abdomen was prepped and draped in the usual sterile fashion. 1% lidocaine was used for local anesthesia. Following this, a 19 gauge, 7-cm, Yueh catheter was introduced. An ultrasound image was saved for documentation purposes. The paracentesis was performed. The catheter was removed and a dressing was applied. The patient tolerated the procedure well without immediate post procedural complication. FINDINGS: A total of approximately 6.1 liters of thick yellow fluid was removed. Samples were sent to the laboratory as requested by the clinical team. IMPRESSION: Successful ultrasound-guided paracentesis yielding 6.1 liters of peritoneal fluid. Read by:  Brynda Greathouse PA-C Electronically Signed   By: Corrie Mckusick D.O.   On: 03/24/2017 12:09   US  Paracentesis  Result Date: 03/12/2017 INDICATION: Patient with history of mesothelioma of the peritoneum, recurrent ascites. Request made for therapeutic paracentesis. EXAM: ULTRASOUND GUIDED THERAPEUTIC PARACENTESIS MEDICATIONS: None. COMPLICATIONS: None immediate. PROCEDURE: Informed written consent was obtained from the patient after a discussion of the risks, benefits and alternatives to treatment. A timeout was performed prior to the  initiation of the procedure. Initial ultrasound scanning demonstrates a large amount of ascites within the left lower abdominal quadrant. The left lower abdomen was prepped and draped in the usual sterile fashion. 1% lidocaine was used for local anesthesia. Following this, a Yueh catheter was introduced. An ultrasound image was saved for documentation purposes. The paracentesis was performed. The catheter was removed and a dressing was applied. The patient tolerated the procedure well without immediate post procedural complication. FINDINGS: A total of approximately 6.2 liters of gelatinous, yellow fluid was removed. IMPRESSION: Successful ultrasound-guided therapeutic paracentesis yielding 6.2 liters of peritoneal fluid. Read by: Rowe Robert, PA-C Electronically Signed   By: Corrie Mckusick D.O.   On: 03/12/2017 14:32   Dg Abd Portable 1v-small Bowel Obstruction Protocol-initial, 8 Hr Delay  Result Date: 04/06/2017 CLINICAL DATA:  8 hour delay, small bowel obstruction protocol EXAM: PORTABLE ABDOMEN - 1 VIEW COMPARISON:  Earlier same day FINDINGS: Nasogastric tube present within the stomach. Previously administered contrast has passed entirely or nearly entirely into and throughout the colon. No sign of any dilated loops on this single view. IMPRESSION: Contrast passed into and throughout the colon. Electronically Signed   By: Nelson Chimes M.D.   On: 04/06/2017 19:37   Dg Abd Portable 1v-small Bowel Protocol-position Verification  Result Date: 04/06/2017 CLINICAL DATA:  Check nasogastric catheter placement EXAM: PORTABLE ABDOMEN - 1 VIEW COMPARISON:  04/01/2017 FINDINGS: Scattered large and small bowel gas is noted. Nasogastric catheter is again seen within the stomach and stable. Left basilar atelectasis is noted. Right chest wall port is seen. IMPRESSION: Nasogastric catheter within the stomach. Electronically Signed   By: Inez Catalina M.D.   On: 04/06/2017 07:47   Dg Abd Portable 1v  Result Date:  04/01/2017 CLINICAL DATA:  Small bowel obstruction EXAM: PORTABLE ABDOMEN - 1 VIEW COMPARISON:  Abdominal radiograph of earlier today. FINDINGS: The nasogastric tube has been advanced since a tip is in the pyloric region of the proximal port in the mid gastric body. There remain loops of mildly distended gas-filled small bowel to the left of midline. IMPRESSION: Interval advancement of the esophagogastric tube since at the tip now lies in the region of the pylorus. Persistent small bowel obstruction. Electronically Signed   By: David  Martinique M.D.   On: 04/01/2017 07:27   Labs: Basic Metabolic Panel:  Recent Labs Lab 04/04/17 0500 04/05/17 0600 04/06/17 0552 04/07/17 0556 04/08/17 0528  NA 135 134* 137 136 136  K 3.5 3.5 3.6 4.0 4.6  CL 98* 98* 102 103 102  CO2 29 28 27 27 26   GLUCOSE 101* 99 108* 118* 110*  BUN 18 15 14 12 10   CREATININE 1.16 1.12 1.07 1.07 1.13  CALCIUM 7.9* 7.8* 7.7* 7.6* 7.8*  MG  --   --   --   --  1.1*   CBC:  Recent Labs Lab 04/05/17 1545 04/06/17 0552 04/07/17 0556 04/08/17 0528  WBC 5.9 6.4 5.0 4.8  NEUTROABS 4.0 4.7  --   --   HGB 8.3* 7.6* 7.3* 8.5*  HCT 24.0* 22.2* 21.3* 26.1*  MCV 88.9 89.5 90.6 89.4  PLT 226 202 221 344  BNP (last 3 results)  Recent Labs  12/16/16 1209  BNP 8.5   CBG:  Recent Labs Lab 04/03/17 0727 04/04/17 0746 04/05/17 0753 04/06/17 0822 04/08/17 0755  GLUCAP 109* 100* 94 102* 100*   Signed:  Barton Dubois MD.  [facility] 04/08/2017, 2:48 PM

## 2017-04-15 ENCOUNTER — Ambulatory Visit (HOSPITAL_BASED_OUTPATIENT_CLINIC_OR_DEPARTMENT_OTHER): Payer: PRIVATE HEALTH INSURANCE | Admitting: Oncology

## 2017-04-15 ENCOUNTER — Ambulatory Visit: Payer: PRIVATE HEALTH INSURANCE

## 2017-04-15 ENCOUNTER — Other Ambulatory Visit (HOSPITAL_BASED_OUTPATIENT_CLINIC_OR_DEPARTMENT_OTHER): Payer: PRIVATE HEALTH INSURANCE

## 2017-04-15 ENCOUNTER — Telehealth: Payer: Self-pay | Admitting: Oncology

## 2017-04-15 VITALS — BP 132/74 | HR 90 | Temp 98.2°F | Resp 18 | Ht 70.0 in | Wt 205.8 lb

## 2017-04-15 DIAGNOSIS — C786 Secondary malignant neoplasm of retroperitoneum and peritoneum: Secondary | ICD-10-CM

## 2017-04-15 DIAGNOSIS — C801 Malignant (primary) neoplasm, unspecified: Principal | ICD-10-CM

## 2017-04-15 DIAGNOSIS — K56609 Unspecified intestinal obstruction, unspecified as to partial versus complete obstruction: Secondary | ICD-10-CM | POA: Diagnosis not present

## 2017-04-15 DIAGNOSIS — R180 Malignant ascites: Secondary | ICD-10-CM

## 2017-04-15 DIAGNOSIS — D6481 Anemia due to antineoplastic chemotherapy: Secondary | ICD-10-CM | POA: Diagnosis not present

## 2017-04-15 DIAGNOSIS — C451 Mesothelioma of peritoneum: Secondary | ICD-10-CM

## 2017-04-15 DIAGNOSIS — R1111 Vomiting without nausea: Secondary | ICD-10-CM | POA: Diagnosis not present

## 2017-04-15 DIAGNOSIS — Z95828 Presence of other vascular implants and grafts: Secondary | ICD-10-CM

## 2017-04-15 DIAGNOSIS — R944 Abnormal results of kidney function studies: Secondary | ICD-10-CM | POA: Diagnosis not present

## 2017-04-15 DIAGNOSIS — D630 Anemia in neoplastic disease: Secondary | ICD-10-CM | POA: Diagnosis not present

## 2017-04-15 LAB — CBC WITH DIFFERENTIAL/PLATELET
BASO%: 0.1 % (ref 0.0–2.0)
Basophils Absolute: 0 10*3/uL (ref 0.0–0.1)
EOS ABS: 0 10*3/uL (ref 0.0–0.5)
EOS%: 0.1 % (ref 0.0–7.0)
HCT: 25.4 % — ABNORMAL LOW (ref 38.4–49.9)
HEMOGLOBIN: 8.6 g/dL — AB (ref 13.0–17.1)
LYMPH%: 9.2 % — ABNORMAL LOW (ref 14.0–49.0)
MCH: 30.9 pg (ref 27.2–33.4)
MCHC: 33.9 g/dL (ref 32.0–36.0)
MCV: 91.4 fL (ref 79.3–98.0)
MONO#: 1.4 10*3/uL — ABNORMAL HIGH (ref 0.1–0.9)
MONO%: 15.5 % — AB (ref 0.0–14.0)
NEUT%: 75.1 % — ABNORMAL HIGH (ref 39.0–75.0)
NEUTROS ABS: 6.8 10*3/uL — AB (ref 1.5–6.5)
Platelets: 609 10*3/uL — ABNORMAL HIGH (ref 140–400)
RBC: 2.78 10*6/uL — ABNORMAL LOW (ref 4.20–5.82)
RDW: 19 % — AB (ref 11.0–14.6)
WBC: 9.1 10*3/uL (ref 4.0–10.3)
lymph#: 0.8 10*3/uL — ABNORMAL LOW (ref 0.9–3.3)

## 2017-04-15 LAB — COMPREHENSIVE METABOLIC PANEL
ALBUMIN: 1.8 g/dL — AB (ref 3.5–5.0)
ALK PHOS: 111 U/L (ref 40–150)
ALT: 7 U/L (ref 0–55)
AST: 13 U/L (ref 5–34)
Anion Gap: 11 mEq/L (ref 3–11)
BILIRUBIN TOTAL: 0.25 mg/dL (ref 0.20–1.20)
BUN: 13.1 mg/dL (ref 7.0–26.0)
CO2: 25 mEq/L (ref 22–29)
CREATININE: 1.5 mg/dL — AB (ref 0.7–1.3)
Calcium: 7.9 mg/dL — ABNORMAL LOW (ref 8.4–10.4)
Chloride: 93 mEq/L — ABNORMAL LOW (ref 98–109)
EGFR: 50 mL/min/{1.73_m2} — ABNORMAL LOW (ref >90)
GLUCOSE: 92 mg/dL (ref 70–140)
Potassium: 5 mEq/L (ref 3.5–5.1)
SODIUM: 129 meq/L — AB (ref 136–145)
Total Protein: 5.3 g/dL — ABNORMAL LOW (ref 6.4–8.3)

## 2017-04-15 LAB — MAGNESIUM: Magnesium: 1 mg/dl — CL (ref 1.5–2.5)

## 2017-04-15 MED ORDER — SODIUM CHLORIDE 0.9% FLUSH
10.0000 mL | INTRAVENOUS | Status: DC | PRN
  Administered 2017-04-15: 10 mL via INTRAVENOUS
  Filled 2017-04-15: qty 10

## 2017-04-15 MED ORDER — HEPARIN SOD (PORK) LOCK FLUSH 100 UNIT/ML IV SOLN
500.0000 [IU] | Freq: Once | INTRAVENOUS | Status: AC | PRN
  Administered 2017-04-15: 500 [IU] via INTRAVENOUS
  Filled 2017-04-15: qty 5

## 2017-04-15 MED ORDER — OXYCODONE-ACETAMINOPHEN 5-325 MG PO TABS: 1.0000 | ORAL_TABLET | ORAL | 0 refills | Status: DC | PRN

## 2017-04-15 NOTE — Progress Notes (Signed)
[place] OFFICE PROGRESS NOTE   Diagnosis: Peritoneal mesothelioma  INTERVAL HISTORY:   Mr. Canter was discharged in the hospital 04/08/2017 after admission with a small bowel obstruction. He began having bowel movements prior to discharge. He continues to have bowel movements, some are solid. He reports several episodes of vomiting since discharge from the hospital. He generally is tolerating a liquid diet. He feels "weak ". The abdominal distention is returning. He takes 3-4 oxycodone tablets per day for relief of abdominal pain.  Objective:  Vital signs in last 24 hours:  Blood pressure 132/74, pulse 90, temperature 98.2 F (36.8 C), temperature source Oral, resp. rate 18, height 5\' 10"  (1.778 m), weight 205 lb 12.8 oz (93.4 kg), SpO2 98 %.    HEENT: No thrush or ulcers Resp: Lungs clear bilaterally Cardio: Regular rate and rhythm GI: Mildly distended, soft, no mass, nontender Vascular: Trace ankle edema bilaterally   Portacath/PICC-without erythema  Lab Results:  Lab Results  Component Value Date   WBC 9.1 04/15/2017   HGB 8.6 (L) 04/15/2017   HCT 25.4 (L) 04/15/2017   MCV 91.4 04/15/2017   PLT 609 (H) 04/15/2017   NEUTROABS 6.8 (H) 04/15/2017  Potassium 5.0, BUN 13.1, creatinine 1.5, albumin 1.8   Medications: I have reviewed the patient's current medications.  Assessment/Plan: 1. Abdominal carcinomatosis-primary peritoneal mesothelioma ? Paracentesis on 12/17/2016 confirmed malignant cells consistent with carcinoma, cytokeratin and WT-1 positive ? CTs of the chest, abdomen, and pelvis with ascites and omental caking. No other evidence of metastatic disease and no primary tumor site identified ? Upper endoscopy 12/29/2016-negative for malignancy ? PET scan 01/01/2017 with extensive hypermetabolic omental caking of tumor with extensive ascites, no primary tumor site identified ? 01/08/2017 omental mass biopsy-malignant mesothelioma ? Cycle 1  cisplatin/Alimta 01/21/2017 ? Cycle 2 cisplatin/Alimta 02/11/2017 ? Cycle 3 cisplatin/Alimta 03/04/2017 ? Cycle 4 cisplatin/Alimta 03/25/2017 ? CT 04/01/2017-persistent ascites and omental thickening/peritoneal nodularity-Slightly improved, new small bowel obstruction   2. Yeast rash in the axilla and groin.  3. Port-A-Cath placement 01/20/2017  4. Delayed nauseasecondary to chemotherapy-persistent following cycle 2 despite Decadron prophylaxis, improved with cycles 3 with the addition of Reglan  5. Malignant ascites             6.   Elevated creatinine-likely secondary to dehydration versus toxicity from cisplatin             7.   Neutropenia secondary to chemotherapy-resolved             8.   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   Admission 03/31/2017 with a partial small bowel obstruction-improved with bowel rest    Disposition:  Mr. Rahmani has intermittent episodes of vomiting, but is having bowel movements and tolerates a liquid diet. He is scheduled for an appointment with Dr. Johney Maine tomorrow to consider surgical options. I doubt he will benefit from additional Alimta/cisplatin.  Mr. Minkin will continue a liquid diet. He will discuss advancing to a mechanical soft diet with Dr. Johney Maine.  The chemistry panel is consistent with mild dehydration. I am reluctant to administer IV fluids with the ascites and need for intermittent paracentesis procedures. He will increase the magnesium to twice daily.  Mr. Knutzen will be scheduled for an office visit and a therapeutic paracentesis 04/20/2017. He will push liquids as tolerated.  25 minutes were spent with the patient today. The majority of the time was  used for counseling and coordination of care.  Betsy Coder, MD  04/15/2017  10:37 AM

## 2017-04-15 NOTE — Telephone Encounter (Signed)
Gave patient AVS and calender per 4/26 los. [facility] to contact patient with US Paracentesis

## 2017-04-20 ENCOUNTER — Ambulatory Visit (HOSPITAL_BASED_OUTPATIENT_CLINIC_OR_DEPARTMENT_OTHER): Payer: PRIVATE HEALTH INSURANCE | Admitting: Oncology

## 2017-04-20 ENCOUNTER — Ambulatory Visit (HOSPITAL_COMMUNITY)
Admission: RE | Admit: 2017-04-20 | Discharge: 2017-04-20 | Disposition: A | Discharge status: Home / Self Care | Payer: PRIVATE HEALTH INSURANCE | Source: Ambulatory Visit | Attending: Oncology | Admitting: Oncology

## 2017-04-20 ENCOUNTER — Other Ambulatory Visit: Payer: Self-pay | Admitting: *Deleted

## 2017-04-20 ENCOUNTER — Other Ambulatory Visit (HOSPITAL_BASED_OUTPATIENT_CLINIC_OR_DEPARTMENT_OTHER): Payer: PRIVATE HEALTH INSURANCE

## 2017-04-20 ENCOUNTER — Ambulatory Visit: Payer: PRIVATE HEALTH INSURANCE

## 2017-04-20 VITALS — BP 123/87 | HR 92 | Temp 98.2°F | Resp 17 | Ht 70.0 in | Wt 214.3 lb

## 2017-04-20 DIAGNOSIS — D6481 Anemia due to antineoplastic chemotherapy: Secondary | ICD-10-CM

## 2017-04-20 DIAGNOSIS — C786 Secondary malignant neoplasm of retroperitoneum and peritoneum: Secondary | ICD-10-CM

## 2017-04-20 DIAGNOSIS — C801 Malignant (primary) neoplasm, unspecified: Secondary | ICD-10-CM | POA: Insufficient documentation

## 2017-04-20 DIAGNOSIS — C451 Mesothelioma of peritoneum: Secondary | ICD-10-CM

## 2017-04-20 DIAGNOSIS — K56609 Unspecified intestinal obstruction, unspecified as to partial versus complete obstruction: Secondary | ICD-10-CM

## 2017-04-20 DIAGNOSIS — R944 Abnormal results of kidney function studies: Secondary | ICD-10-CM | POA: Diagnosis not present

## 2017-04-20 DIAGNOSIS — Z95828 Presence of other vascular implants and grafts: Secondary | ICD-10-CM

## 2017-04-20 DIAGNOSIS — D630 Anemia in neoplastic disease: Secondary | ICD-10-CM | POA: Diagnosis not present

## 2017-04-20 DIAGNOSIS — R180 Malignant ascites: Secondary | ICD-10-CM

## 2017-04-20 LAB — BASIC METABOLIC PANEL
ANION GAP: 10 meq/L (ref 3–11)
BUN: 18.2 mg/dL (ref 7.0–26.0)
CALCIUM: 8.5 mg/dL (ref 8.4–10.4)
CO2: 27 mEq/L (ref 22–29)
Chloride: 94 mEq/L — ABNORMAL LOW (ref 98–109)
Creatinine: 1.7 mg/dL — ABNORMAL HIGH (ref 0.7–1.3)
EGFR: 46 mL/min/{1.73_m2} — ABNORMAL LOW (ref >90)
GLUCOSE: 109 mg/dL (ref 70–140)
POTASSIUM: 4.9 meq/L (ref 3.5–5.1)
Sodium: 131 mEq/L — ABNORMAL LOW (ref 136–145)

## 2017-04-20 LAB — CBC WITH DIFFERENTIAL/PLATELET
BASO%: 0.2 % (ref 0.0–2.0)
Basophils Absolute: 0 10*3/uL (ref 0.0–0.1)
EOS%: 0.2 % (ref 0.0–7.0)
Eosinophils Absolute: 0 10*3/uL (ref 0.0–0.5)
HEMATOCRIT: 24.5 % — AB (ref 38.4–49.9)
HGB: 8 g/dL — ABNORMAL LOW (ref 13.0–17.1)
LYMPH#: 1.2 10*3/uL (ref 0.9–3.3)
LYMPH%: 10.5 % — ABNORMAL LOW (ref 14.0–49.0)
MCH: 31.1 pg (ref 27.2–33.4)
MCHC: 32.7 g/dL (ref 32.0–36.0)
MCV: 95.3 fL (ref 79.3–98.0)
MONO#: 1.3 10*3/uL — ABNORMAL HIGH (ref 0.1–0.9)
MONO%: 12.1 % (ref 0.0–14.0)
NEUT#: 8.6 10*3/uL — ABNORMAL HIGH (ref 1.5–6.5)
NEUT%: 77 % — AB (ref 39.0–75.0)
Platelets: 523 10*3/uL — ABNORMAL HIGH (ref 140–400)
RBC: 2.57 10*6/uL — ABNORMAL LOW (ref 4.20–5.82)
RDW: 20 % — ABNORMAL HIGH (ref 11.0–14.6)
WBC: 11.1 10*3/uL — ABNORMAL HIGH (ref 4.0–10.3)

## 2017-04-20 LAB — MAGNESIUM: Magnesium: 1.2 mg/dl — CL (ref 1.5–2.5)

## 2017-04-20 MED ORDER — SODIUM CHLORIDE 0.9% FLUSH
10.0000 mL | INTRAVENOUS | Status: DC | PRN
  Administered 2017-04-20: 10 mL via INTRAVENOUS
  Filled 2017-04-20: qty 10

## 2017-04-20 MED ORDER — SODIUM CHLORIDE 0.9% FLUSH
10.0000 mL | Freq: Once | INTRAVENOUS | Status: AC
  Administered 2017-04-20: 10 mL
  Filled 2017-04-20: qty 10

## 2017-04-20 MED ORDER — HEPARIN SOD (PORK) LOCK FLUSH 100 UNIT/ML IV SOLN
500.0000 [IU] | Freq: Once | INTRAVENOUS | Status: AC
  Administered 2017-04-20: 500 [IU] via INTRAVENOUS
  Filled 2017-04-20: qty 5

## 2017-04-20 NOTE — Progress Notes (Signed)
[place] OFFICE PROGRESS NOTE   Diagnosis: Peritoneal mesothelioma  INTERVAL HISTORY:   Mr. Kisling returns as scheduled. He is tolerating a regular diet. He is having bowel movements. He had one episode of emesis this week. The abdomen is more distended. He has increased pain and dyspnea when the abdomen becomes this distended. He has noted leg edema. He saw Dr.Votanopoulos on 04/16/2017. He recommends cytoreductive surgery with intraperitoneal chemotherapy.   Objective:  Vital signs in last 24 hours:  Blood pressure 123/87, pulse 92, temperature 98.2 F (36.8 C), temperature source Oral, resp. rate 17, height 5\' 10"  (1.778 m), weight 214 lb 4.8 oz (97.2 kg).    HEENT: No thrush or ulcers Resp: Lungs clear bilaterally Cardio: Regular rate and rhythm GI: The abdomen is distended with ascites, no mass Vascular: Pitting edema at the lower leg bilaterally   Portacath/PICC-without erythema  Lab Results:  Lab Results  Component Value Date   WBC 11.1 (H) 04/20/2017   HGB 8.0 (L) 04/20/2017   HCT 24.5 (L) 04/20/2017   MCV 95.3 04/20/2017   PLT 523 (H) 04/20/2017   NEUTROABS 8.6 (H) 04/20/2017    CMP     Component Value Date/Time   NA 131 (L) 04/20/2017 1023   K 4.9 04/20/2017 1023   CL 102 04/08/2017 0528   CO2 27 04/20/2017 1023   GLUCOSE 109 04/20/2017 1023   BUN 18.2 04/20/2017 1023   CREATININE 1.7 (H) 04/20/2017 1023   CALCIUM 8.5 04/20/2017 1023   PROT 5.3 (L) 04/15/2017 0902   ALBUMIN 1.8 (L) 04/15/2017 0902   AST 13 04/15/2017 0902   ALT 7 04/15/2017 0902   ALKPHOS 111 04/15/2017 0902   BILITOT 0.25 04/15/2017 0902   GFRNONAA >60 04/08/2017 0528   GFRAA >60 04/08/2017 0528     Medications: I have reviewed the patient's current medications.  Assessment/Plan: 1. Abdominal carcinomatosis-primary peritoneal mesothelioma ? Paracentesis on 12/17/2016 confirmed malignant cells consistent with carcinoma, cytokeratin and WT-1  positive ? CTs of the chest, abdomen, and pelvis with ascites and omental caking. No other evidence of metastatic disease and no primary tumor site identified ? Upper endoscopy 12/29/2016-negative for malignancy ? PET scan 01/01/2017 with extensive hypermetabolic omental caking of tumor with extensive ascites, no primary tumor site identified ? 01/08/2017 omental mass biopsy-malignant mesothelioma ? Cycle 1 cisplatin/Alimta 01/21/2017 ? Cycle 2 cisplatin/Alimta 02/11/2017 ? Cycle 3 cisplatin/Alimta 03/04/2017 ? Cycle 4 cisplatin/Alimta 03/25/2017 ? CT 04/01/2017-persistent ascites and omental thickening/peritoneal nodularity-Slightly improved, new small bowel obstruction   2. Yeast rash in the axilla and groin.  3. Port-A-Cath placement 01/20/2017  4. Delayed nauseasecondary to chemotherapy-persistent following cycle 2 despite Decadron prophylaxis, improved with cycles 3 with the addition of Reglan  5. Malignant ascites  6. Elevated creatinine-likely secondary to dehydration versus toxicity from cisplatin  7. Neutropenia secondary to chemotherapy-resolved  8. 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   Admission 03/31/2017 with a partial small bowel obstruction-improved with bowel rest   Disposition:  The bowel obstruction symptoms are much improved. He is now tolerating a regular diet. I discussed the case with Dr. Johney Maine today. He feels Mr. Gotcher has benefited from the Alimta/cisplatin. He recommends proceeding with another cycle of chemotherapy prior to cytoreductive surgery. Surgery is being scheduled for 06/14/2017.  Mr. Lauderback will complete another cycle of chemotherapy within the next one to 2 weeks. He is scheduled for a paracentesis today. The  elevated BUN/creatinine is likely related to intravascular depletion. I recommended he push fluids. We  will check a chemistry panel prior to administering further cisplatin.  He will return for an office visit after the cycle of chemotherapy.  30 minutes were spent with the patient today. The majority of the time was used for counseling and coordination of care.  Betsy Coder, MD  04/20/2017  11:52 AM

## 2017-04-20 NOTE — Procedures (Signed)
Ultrasound-guided therapeutic paracentesis performed yielding 8.8 liters of gelatinous, yellow fluid. No immediate complications.

## 2017-04-21 ENCOUNTER — Ambulatory Visit (HOSPITAL_COMMUNITY): Payer: PRIVATE HEALTH INSURANCE

## 2017-04-22 ENCOUNTER — Telehealth: Payer: Self-pay | Admitting: Oncology

## 2017-04-22 NOTE — Telephone Encounter (Signed)
Spoke with patient re next appointment for 5/11. Patient will get new schedule when he comes in 5/11. Appointments scheduled per last amended los 5/1.

## 2017-04-24 ENCOUNTER — Other Ambulatory Visit: Payer: Self-pay | Admitting: Oncology

## 2017-04-26 ENCOUNTER — Telehealth: Payer: Self-pay

## 2017-04-26 MED ORDER — MAGNESIUM OXIDE 400 MG PO TABS: 400.0000 mg | ORAL_TABLET | Freq: Two times a day (BID) | ORAL | 1 refills | Status: AC

## 2017-04-26 NOTE — Telephone Encounter (Signed)
Called pt, informed him magnesium can be purchased over the counter. Escribed BID dose for pt to compare with pharmacy.

## 2017-04-26 NOTE — Telephone Encounter (Signed)
Pt called for mag oxide refill. The Rx states 1 tab/day. Pt states Dr Benay Spice increased this to 2 tab/day and pharmacy needs new Rx for this. No note seen in chart.

## 2017-04-27 ENCOUNTER — Other Ambulatory Visit: Payer: Self-pay | Admitting: *Deleted

## 2017-04-27 MED ORDER — LORAZEPAM 1 MG PO TABS: 1.0000 mg | ORAL_TABLET | Freq: Four times a day (QID) | ORAL | 0 refills | Status: DC | PRN

## 2017-04-28 ENCOUNTER — Telehealth: Payer: Self-pay | Admitting: *Deleted

## 2017-04-28 ENCOUNTER — Other Ambulatory Visit: Payer: Self-pay | Admitting: *Deleted

## 2017-04-28 DIAGNOSIS — C451 Mesothelioma of peritoneum: Secondary | ICD-10-CM

## 2017-04-28 NOTE — Telephone Encounter (Signed)
Pt called requesting Paracentesis to be set up for Monday am  05/03/17. Pt's    Phone     [phone].

## 2017-04-30 ENCOUNTER — Ambulatory Visit (HOSPITAL_COMMUNITY): Payer: PRIVATE HEALTH INSURANCE

## 2017-04-30 ENCOUNTER — Ambulatory Visit: Payer: PRIVATE HEALTH INSURANCE

## 2017-04-30 ENCOUNTER — Telehealth: Payer: Self-pay | Admitting: Oncology

## 2017-04-30 ENCOUNTER — Ambulatory Visit (HOSPITAL_BASED_OUTPATIENT_CLINIC_OR_DEPARTMENT_OTHER): Payer: PRIVATE HEALTH INSURANCE

## 2017-04-30 ENCOUNTER — Other Ambulatory Visit (HOSPITAL_BASED_OUTPATIENT_CLINIC_OR_DEPARTMENT_OTHER): Payer: PRIVATE HEALTH INSURANCE

## 2017-04-30 ENCOUNTER — Ambulatory Visit (HOSPITAL_BASED_OUTPATIENT_CLINIC_OR_DEPARTMENT_OTHER): Payer: PRIVATE HEALTH INSURANCE | Admitting: Oncology

## 2017-04-30 VITALS — BP 104/75 | HR 102

## 2017-04-30 VITALS — BP 84/55 | HR 101 | Resp 16

## 2017-04-30 VITALS — BP 106/66 | HR 95 | Temp 97.6°F | Resp 18

## 2017-04-30 DIAGNOSIS — Z95828 Presence of other vascular implants and grafts: Secondary | ICD-10-CM

## 2017-04-30 DIAGNOSIS — Z452 Encounter for adjustment and management of vascular access device: Secondary | ICD-10-CM

## 2017-04-30 DIAGNOSIS — C451 Mesothelioma of peritoneum: Secondary | ICD-10-CM

## 2017-04-30 DIAGNOSIS — C801 Malignant (primary) neoplasm, unspecified: Secondary | ICD-10-CM

## 2017-04-30 DIAGNOSIS — R42 Dizziness and giddiness: Secondary | ICD-10-CM

## 2017-04-30 DIAGNOSIS — R109 Unspecified abdominal pain: Secondary | ICD-10-CM

## 2017-04-30 DIAGNOSIS — R180 Malignant ascites: Secondary | ICD-10-CM

## 2017-04-30 DIAGNOSIS — D6481 Anemia due to antineoplastic chemotherapy: Secondary | ICD-10-CM

## 2017-04-30 DIAGNOSIS — E860 Dehydration: Secondary | ICD-10-CM | POA: Diagnosis not present

## 2017-04-30 DIAGNOSIS — R944 Abnormal results of kidney function studies: Secondary | ICD-10-CM

## 2017-04-30 DIAGNOSIS — R1110 Vomiting, unspecified: Secondary | ICD-10-CM

## 2017-04-30 DIAGNOSIS — D630 Anemia in neoplastic disease: Secondary | ICD-10-CM

## 2017-04-30 DIAGNOSIS — R55 Syncope and collapse: Secondary | ICD-10-CM

## 2017-04-30 DIAGNOSIS — B372 Candidiasis of skin and nail: Secondary | ICD-10-CM

## 2017-04-30 DIAGNOSIS — C786 Secondary malignant neoplasm of retroperitoneum and peritoneum: Secondary | ICD-10-CM

## 2017-04-30 DIAGNOSIS — T451X5A Adverse effect of antineoplastic and immunosuppressive drugs, initial encounter: Secondary | ICD-10-CM

## 2017-04-30 DIAGNOSIS — R112 Nausea with vomiting, unspecified: Secondary | ICD-10-CM

## 2017-04-30 LAB — COMPREHENSIVE METABOLIC PANEL
ALT: 9 U/L (ref 0–55)
ANION GAP: 11 meq/L (ref 3–11)
AST: 13 U/L (ref 5–34)
Albumin: 1.4 g/dL — ABNORMAL LOW (ref 3.5–5.0)
Alkaline Phosphatase: 128 U/L (ref 40–150)
BILIRUBIN TOTAL: 0.22 mg/dL (ref 0.20–1.20)
BUN: 29.2 mg/dL — AB (ref 7.0–26.0)
CO2: 28 meq/L (ref 22–29)
CREATININE: 2.3 mg/dL — AB (ref 0.7–1.3)
Calcium: 8.6 mg/dL (ref 8.4–10.4)
Chloride: 89 mEq/L — ABNORMAL LOW (ref 98–109)
EGFR: 31 mL/min/{1.73_m2} — ABNORMAL LOW (ref >90)
GLUCOSE: 109 mg/dL (ref 70–140)
Potassium: 4.6 mEq/L (ref 3.5–5.1)
SODIUM: 128 meq/L — AB (ref 136–145)
TOTAL PROTEIN: 5.8 g/dL — AB (ref 6.4–8.3)

## 2017-04-30 LAB — CBC WITH DIFFERENTIAL/PLATELET
BASO%: 0.1 % (ref 0.0–2.0)
Basophils Absolute: 0 10*3/uL (ref 0.0–0.1)
EOS ABS: 0 10*3/uL (ref 0.0–0.5)
EOS%: 0 % (ref 0.0–7.0)
HCT: 27.7 % — ABNORMAL LOW (ref 38.4–49.9)
HEMOGLOBIN: 8.9 g/dL — AB (ref 13.0–17.1)
LYMPH%: 3.3 % — AB (ref 14.0–49.0)
MCH: 31.9 pg (ref 27.2–33.4)
MCHC: 32.1 g/dL (ref 32.0–36.0)
MCV: 99.3 fL — ABNORMAL HIGH (ref 79.3–98.0)
MONO#: 1.3 10*3/uL — AB (ref 0.1–0.9)
MONO%: 7.2 % (ref 0.0–14.0)
NEUT%: 89.4 % — ABNORMAL HIGH (ref 39.0–75.0)
NEUTROS ABS: 16.2 10*3/uL — AB (ref 1.5–6.5)
PLATELETS: 632 10*3/uL — AB (ref 140–400)
RBC: 2.79 10*6/uL — AB (ref 4.20–5.82)
RDW: 18.1 % — AB (ref 11.0–14.6)
WBC: 18.1 10*3/uL — AB (ref 4.0–10.3)
lymph#: 0.6 10*3/uL — ABNORMAL LOW (ref 0.9–3.3)

## 2017-04-30 LAB — MAGNESIUM: Magnesium: 1.6 mg/dl (ref 1.5–2.5)

## 2017-04-30 MED ORDER — SODIUM CHLORIDE 0.9% FLUSH
10.0000 mL | INTRAVENOUS | Status: DC | PRN
  Administered 2017-04-30: 10 mL via INTRAVENOUS
  Filled 2017-04-30: qty 10

## 2017-04-30 MED ORDER — ONDANSETRON HCL 4 MG/2ML IJ SOLN
8.0000 mg | Freq: Once | INTRAMUSCULAR | Status: AC
Start: 2017-04-30 — End: 2017-04-30
  Administered 2017-04-30: 8 mg via INTRAVENOUS

## 2017-04-30 MED ORDER — SODIUM CHLORIDE 0.9% FLUSH
10.0000 mL | Freq: Once | INTRAVENOUS | Status: AC
  Administered 2017-04-30: 10 mL
  Filled 2017-04-30: qty 10

## 2017-04-30 MED ORDER — SODIUM CHLORIDE 0.9 % IV SOLN: Freq: Once | INTRAVENOUS | Status: DC

## 2017-04-30 MED ORDER — HEPARIN SOD (PORK) LOCK FLUSH 100 UNIT/ML IV SOLN
500.0000 [IU] | Freq: Once | INTRAVENOUS | Status: AC | PRN
  Administered 2017-04-30: 500 [IU] via INTRAVENOUS
  Filled 2017-04-30: qty 5

## 2017-04-30 MED ORDER — SODIUM CHLORIDE 0.9 % IV SOLN
INTRAVENOUS | Status: DC
  Administered 2017-04-30: 13:00:00 via INTRAVENOUS

## 2017-04-30 MED ORDER — OXYCODONE-ACETAMINOPHEN 5-325 MG PO TABS: 1.0000 | ORAL_TABLET | ORAL | 0 refills | Status: DC | PRN

## 2017-04-30 MED ORDER — ALTEPLASE 2 MG IJ SOLR
2.0000 mg | Freq: Once | INTRAMUSCULAR | Status: AC | PRN
  Administered 2017-04-30: 2 mg
  Filled 2017-04-30: qty 2

## 2017-04-30 MED ORDER — SODIUM CHLORIDE 0.9 % IV SOLN
Freq: Once | INTRAVENOUS | Status: AC
  Administered 2017-04-30: 15:00:00 via INTRAVENOUS

## 2017-04-30 MED ORDER — SODIUM CHLORIDE 0.9 % IV SOLN
INTRAVENOUS | Status: DC
  Administered 2017-04-30: 11:00:00 via INTRAVENOUS

## 2017-04-30 NOTE — Progress Notes (Signed)
Patient fell as he was entering first set of sliding doors at [facility] entrance. Patient stated, "I blacked out and fell. I don't remember what I fell on or how I fell." Family friend Anselm Pancoast said, "I offered him a wheelchair beforehand, and he told me he would be okay. I had turned around to grab our bags when he fell. I did not see the fall occur." A gentleman in the lobby was able to come over and help the patient into a wheelchair. Wilma, Network engineer, was on-scene, but also did not witness the patient's fall. Wilma called the infusion room desk at [place] and this RN arrived on scene at 13. I did a quick assessment to determine the patient's status. He was alert and oriented x4, he had strong hand grasps and toe push/pull. There was a 2-inch long red carpet burn on the patient's upper left eyebrow, but no bruising or bleeding evident. Some slight edema on the upper left eyebrow was noted. Patient was given ice and information gathered surrounding the fall. Communicated importance to the patient and family friend to notify healthcare personnel if any changes in status should occur. Patient and family friend verbalized understanding. Dr. Benay Spice notified of event at [phone] and agreed to assess patient in the infusion room during his scheduled appointment. Later learned that patient had been experiencing nausea, vomiting, and dizziness for the past three days. Patient noted, "It could have been the dizziness that caused the fall. I have been having trouble eating and drinking for the past few days because I have been unable to keep anything down." Vital signs taken in flush room prior to appointment with this RN in infusion room. According to patient, blood pressure was a little lower than normal.   0938: BP 99/58 - Dr. Benay Spice made aware of pt status, and plans to visit pt during infusion appt. Okay to tx despite dizziness, nausea, and low BP.  D/t ctn 2.3 Dr. Benay Spice would like to hold tx today and pt to receive 2  hours IVF at 550mL/hr followed by orthostatic BP assessment.   Ned Card, NP visited pt at chairside. They discussed potential causes of fall and any changes post-fall.   Orthostatic BP and HR acquired post IVF, BP trended down and HR trended up.   1353: Pt disconnected from IVF line and transferred to Symptom Management chair to be evaluated by Dr. Benay Spice.

## 2017-04-30 NOTE — Addendum Note (Signed)
Addended by: Ardeen Garland on: 04/30/2017 04:09 PM   Modules accepted: Orders

## 2017-04-30 NOTE — Progress Notes (Signed)
[place] OFFICE PROGRESS NOTE   Diagnosis: Peritoneal mesothelioma  INTERVAL HISTORY:   Frank Tucker presented for scheduled chemotherapy today. He felt "dizzy "and fell in the lobby. He reports intermittent dizziness. He is vomiting several times per day. He has been eating a regular diet. He is having bowel movements. He underwent a paracentesis for 8.8 L of fluid on 04/20/2017. He continues to have abdominal pain.  Objective:  Vital signs in last 24 hours:  There were no vitals taken for this visit.    HEENT: The mouth is dry, no thrush Resp: Decreased breath sounds at the bases with scattered coarse rhonchi at the bases, no respiratory distress Cardio: Regular rate and rhythm GI: Mildly distended with ascites Vascular: No leg edema Neuro: Alert and oriented  Skin: Small abrasion at the left frontal area   Portacath/PICC-without erythema  Lab Results:  Lab Results  Component Value Date   WBC 18.1 (H) 04/30/2017   HGB 8.9 (L) 04/30/2017   HCT 27.7 (L) 04/30/2017   MCV 99.3 (H) 04/30/2017   PLT 632 (H) 04/30/2017   NEUTROABS 16.2 (H) 04/30/2017    CMP     Component Value Date/Time   NA 128 (L) 04/30/2017 0830   K 4.6 04/30/2017 0830   CL 102 04/08/2017 0528   CO2 28 04/30/2017 0830   GLUCOSE 109 04/30/2017 0830   BUN 29.2 (H) 04/30/2017 0830   CREATININE 2.3 (H) 04/30/2017 0830   CALCIUM 8.6 04/30/2017 0830   PROT 5.8 (L) 04/30/2017 0830   ALBUMIN 1.4 (L) 04/30/2017 0830   AST 13 04/30/2017 0830   ALT 9 04/30/2017 0830   ALKPHOS 128 04/30/2017 0830   BILITOT 0.22 04/30/2017 0830   GFRNONAA >60 04/08/2017 0528   GFRAA >60 04/08/2017 0528     Medications: I have reviewed the patient's current medications.  Assessment/Plan: . Abdominal carcinomatosis-primary peritoneal mesothelioma ? Paracentesis on 12/17/2016 confirmed malignant cells consistent with carcinoma, cytokeratin and WT-1 positive ? CTs of the chest, abdomen, and pelvis with  ascites and omental caking. No other evidence of metastatic disease and no primary tumor site identified ? Upper endoscopy 12/29/2016-negative for malignancy ? PET scan 01/01/2017 with extensive hypermetabolic omental caking of tumor with extensive ascites, no primary tumor site identified ? 01/08/2017 omental mass biopsy-malignant mesothelioma ? Cycle 1 cisplatin/Alimta 01/21/2017 ? Cycle 2 cisplatin/Alimta 02/11/2017 ? Cycle 3 cisplatin/Alimta 03/04/2017 ? Cycle 4 cisplatin/Alimta 03/25/2017 ? CT 04/01/2017-persistent ascites and omental thickening/peritoneal nodularity-Slightly improved, new small bowel obstruction   2. Yeast rash in the axilla and groin.  3. Port-A-Cath placement 01/20/2017  4. Delayed nauseasecondary to chemotherapy-persistent following cycle 2 despite Decadron prophylaxis, improved with cycles 3 with the addition of Reglan  5. Malignant ascites  6. Elevated creatinine-likely secondary to dehydration versus toxicity from cisplatin  7. Neutropenia secondary to chemotherapy-resolved  8. 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 Admission 03/31/2017 with a partial small bowel obstruction-improved with bowel rest           10.   Syncope event at the Cancer center 04/30/2017 secondary to dehydration  Disposition:  Mr. Kerlin has clinical and laboratory evidence of dehydration today. He has persistent obstructive symptoms. I recommended he change to a liquid diet. He received intravenous fluids at the Cancer center today and will return for additional fluids tomorrow. I recommended he see a fluids as tolerated.  He will not be a candidate for another cycle of chemotherapy unless his clinical status improves. He will  return for an office visit 05/03/2017.  30 minutes were spent with the patient today. The majority of the time was used for  counseling and coordination of care.  Betsy Coder, MD  04/30/2017  3:36 PM

## 2017-04-30 NOTE — Addendum Note (Signed)
Addended by: Brien Few on: 04/30/2017 02:40 PM   Modules accepted: Orders

## 2017-04-30 NOTE — Progress Notes (Signed)
Pt arrived with NVD. Stated, "it's been going on for the past few days. I haven't been able to keep anything down." Pt also stated, "I've had a lot of dizziness." Emesis measured 100 cc. Vts taken, 106/66 bp, 97.6 temp. 95 hr, 18 rr, 97 02. Lear Carstens LPN

## 2017-04-30 NOTE — Patient Instructions (Signed)
Dehydration, Adult Dehydration is a condition in which there is not enough fluid or water in the body. This happens when you lose more fluids than you take in. Important organs, such as the kidneys, brain, and heart, cannot function without a proper amount of fluids. Any loss of fluids from the body can lead to dehydration. Dehydration can range from mild to severe. This condition should be treated right away to prevent it from becoming severe. What are the causes? This condition may be caused by:  Vomiting.  Diarrhea.  Excessive sweating, such as from heat exposure or exercise.  Not drinking enough fluid, especially:  When ill.  While doing activity that requires a lot of energy.  Excessive urination.  Fever.  Infection.  Certain medicines, such as medicines that cause the body to lose excess fluid (diuretics).  Inability to access safe drinking water.  Reduced physical ability to get adequate water and food. What increases the risk? This condition is more likely to develop in people:  Who have a poorly controlled long-term (chronic) illness, such as diabetes, heart disease, or kidney disease.  Who are age 65 or older.  Who are disabled.  Who live in a place with high altitude.  Who play endurance sports. What are the signs or symptoms? Symptoms of mild dehydration may include:   Thirst.  Dry lips.  Slightly dry mouth.  Dry, warm skin.  Dizziness. Symptoms of moderate dehydration may include:   Very dry mouth.  Muscle cramps.  Dark urine. Urine may be the color of tea.  Decreased urine production.  Decreased tear production.  Heartbeat that is irregular or faster than normal (palpitations).  Headache.  Light-headedness, especially when you stand up from a sitting position.  Fainting (syncope). Symptoms of severe dehydration may include:   Changes in skin, such as:  Cold and clammy skin.  Blotchy (mottled) or pale skin.  Skin that does  not quickly return to normal after being lightly pinched and released (poor skin turgor).  Changes in body fluids, such as:  Extreme thirst.  No tear production.  Inability to sweat when body temperature is high, such as in hot weather.  Very little urine production.  Changes in vital signs, such as:  Weak pulse.  Pulse that is more than 100 beats a minute when sitting still.  Rapid breathing.  Low blood pressure.  Other changes, such as:  Sunken eyes.  Cold hands and feet.  Confusion.  Lack of energy (lethargy).  Difficulty waking up from sleep.  Short-term weight loss.  Unconsciousness. How is this diagnosed? This condition is diagnosed based on your symptoms and a physical exam. Blood and urine tests may be done to help confirm the diagnosis. How is this treated? Treatment for this condition depends on the severity. Mild or moderate dehydration can often be treated at home. Treatment should be started right away. Do not wait until dehydration becomes severe. Severe dehydration is an emergency and it needs to be treated in a hospital. Treatment for mild dehydration may include:   Drinking more fluids.  Replacing salts and minerals in your blood (electrolytes) that you may have lost. Treatment for moderate dehydration may include:   Drinking an oral rehydration solution (ORS). This is a drink that helps you replace fluids and electrolytes (rehydrate). It can be found at pharmacies and retail stores. Treatment for severe dehydration may include:   Receiving fluids through an IV tube.  Receiving an electrolyte solution through a feeding tube that is   passed through your nose and into your stomach (nasogastric tube, or NG tube).  Correcting any abnormalities in electrolytes.  Treating the underlying cause of dehydration. Follow these instructions at home:  If directed by your health care provider, drink an ORS:  Make an ORS by following instructions on the  package.  Start by drinking small amounts, about  cup (120 mL) every 5-10 minutes.  Slowly increase how much you drink until you have taken the amount recommended by your health care provider.  Drink enough clear fluid to keep your urine clear or pale yellow. If you were told to drink an ORS, finish the ORS first, then start slowly drinking other clear fluids. Drink fluids such as:  Water. Do not drink only water. Doing that can lead to having too little salt (sodium) in the body (hyponatremia).  Ice chips.  Fruit juice that you have added water to (diluted fruit juice).  Low-calorie sports drinks.  Avoid:  Alcohol.  Drinks that contain a lot of sugar. These include high-calorie sports drinks, fruit juice that is not diluted, and soda.  Caffeine.  Foods that are greasy or contain a lot of fat or sugar.  Take over-the-counter and prescription medicines only as told by your health care provider.  Do not take sodium tablets. This can lead to having too much sodium in the body (hypernatremia).  Eat foods that contain a healthy balance of electrolytes, such as bananas, oranges, potatoes, tomatoes, and spinach.  Keep all follow-up visits as told by your health care provider. This is important. Contact a health care provider if:  You have abdominal pain that:  Gets worse.  Stays in one area (localizes).  You have a rash.  You have a stiff neck.  You are more irritable than usual.  You are sleepier or more difficult to wake up than usual.  You feel weak or dizzy.  You feel very thirsty.  You have urinated only a small amount of very dark urine over 6-8 hours. Get help right away if:  You have symptoms of severe dehydration.  You cannot drink fluids without vomiting.  Your symptoms get worse with treatment.  You have a fever.  You have a severe headache.  You have vomiting or diarrhea that:  Gets worse.  Does not go away.  You have blood or green matter  (bile) in your vomit.  You have blood in your stool. This may cause stool to look black and tarry.  You have not urinated in 6-8 hours.  You faint.  Your heart rate while sitting still is over 100 beats a minute.  You have trouble breathing. This information is not intended to replace advice given to you by your health care provider. Make sure you discuss any questions you have with your health care provider. Document Released: 12/07/2005 Document Revised: 07/03/2016 Document Reviewed: 01/31/2016 Elsevier Interactive Patient Education  2017 [employer]  

## 2017-04-30 NOTE — Telephone Encounter (Signed)
Appointments scheduled per 5.11.18 LOS. Patient given AVS report and calendars with future scheduled appointments. °

## 2017-05-01 ENCOUNTER — Ambulatory Visit (HOSPITAL_BASED_OUTPATIENT_CLINIC_OR_DEPARTMENT_OTHER): Payer: PRIVATE HEALTH INSURANCE

## 2017-05-01 VITALS — BP 108/69 | HR 80 | Temp 97.8°F | Resp 18

## 2017-05-01 DIAGNOSIS — E860 Dehydration: Secondary | ICD-10-CM | POA: Diagnosis not present

## 2017-05-01 DIAGNOSIS — Z95828 Presence of other vascular implants and grafts: Secondary | ICD-10-CM

## 2017-05-01 DIAGNOSIS — C451 Mesothelioma of peritoneum: Secondary | ICD-10-CM | POA: Diagnosis not present

## 2017-05-01 MED ORDER — SODIUM CHLORIDE 0.9% FLUSH
10.0000 mL | INTRAVENOUS | Status: DC | PRN
  Administered 2017-05-01: 10 mL via INTRAVENOUS
  Filled 2017-05-01: qty 10

## 2017-05-01 MED ORDER — SODIUM CHLORIDE 0.9 % IV SOLN
INTRAVENOUS | Status: AC
  Administered 2017-05-01: 08:00:00 via INTRAVENOUS

## 2017-05-01 MED ORDER — HEPARIN SOD (PORK) LOCK FLUSH 100 UNIT/ML IV SOLN
500.0000 [IU] | Freq: Once | INTRAVENOUS | Status: AC | PRN
  Administered 2017-05-01: 500 [IU] via INTRAVENOUS
  Filled 2017-05-01: qty 5

## 2017-05-01 NOTE — Patient Instructions (Signed)
Dehydration, Adult Dehydration is when there is not enough fluid or water in your body. This happens when you lose more fluids than you take in. Dehydration can range from mild to very bad. It should be treated right away to keep it from getting very bad. Symptoms of mild dehydration may include:   Thirst.  Dry lips.  Slightly dry mouth.  Dry, warm skin.  Dizziness. Symptoms of moderate dehydration may include:   Very dry mouth.  Muscle cramps.  Dark pee (urine). Pee may be the color of tea.  Your body making less pee.  Your eyes making fewer tears.  Heartbeat that is uneven or faster than normal (palpitations).  Headache.  Light-headedness, especially when you stand up from sitting.  Fainting (syncope). Symptoms of very bad dehydration may include:   Changes in skin, such as:  Cold and clammy skin.  Blotchy (mottled) or pale skin.  Skin that does not quickly return to normal after being lightly pinched and let go (poor skin turgor).  Changes in body fluids, such as:  Feeling very thirsty.  Your eyes making fewer tears.  Not sweating when body temperature is high, such as in hot weather.  Your body making very little pee.  Changes in vital signs, such as:  Weak pulse.  Pulse that is more than 100 beats a minute when you are sitting still.  Fast breathing.  Low blood pressure.  Other changes, such as:  Sunken eyes.  Cold hands and feet.  Confusion.  Lack of energy (lethargy).  Trouble waking up from sleep.  Short-term weight loss.  Unconsciousness. Follow these instructions at home:  If told by your doctor, drink an ORS:  Make an ORS by using instructions on the package.  Start by drinking small amounts, about  cup (120 mL) every 5-10 minutes.  Slowly drink more until you have had the amount that your doctor said to have.  Drink enough clear fluid to keep your pee clear or pale yellow. If you were told to drink an ORS, finish the  ORS first, then start slowly drinking clear fluids. Drink fluids such as:  Water. Do not drink only water by itself. Doing that can make the salt (sodium) level in your body get too low (hyponatremia).  Ice chips.  Fruit juice that you have added water to (diluted).  Low-calorie sports drinks.  Avoid:  Alcohol.  Drinks that have a lot of sugar. These include high-calorie sports drinks, fruit juice that does not have water added, and soda.  Caffeine.  Foods that are greasy or have a lot of fat or sugar.  Take over-the-counter and prescription medicines only as told by your doctor.  Do not take salt tablets. Doing that can make the salt level in your body get too high (hypernatremia).  Eat foods that have minerals (electrolytes). Examples include bananas, oranges, potatoes, tomatoes, and spinach.  Keep all follow-up visits as told by your doctor. This is important. Contact a doctor if:  You have belly (abdominal) pain that:  Gets worse.  Stays in one area (localizes).  You have a rash.  You have a stiff neck.  You get angry or annoyed more easily than normal (irritability).  You are more sleepy than normal.  You have a harder time waking up than normal.  You feel:  Weak.  Dizzy.  Very thirsty.  You have peed (urinated) only a small amount of very dark pee during 6-8 hours. Get help right away if:  You   have symptoms of very bad dehydration.  You cannot drink fluids without throwing up (vomiting).  Your symptoms get worse with treatment.  You have a fever.  You have a very bad headache.  You are throwing up or having watery poop (diarrhea) and it:  Gets worse.  Does not go away.  You have blood or something green (bile) in your throw-up.  You have blood in your poop (stool). This may cause poop to look black and tarry.  You have not peed in 6-8 hours.  You pass out (faint).  Your heart rate when you are sitting still is more than 100 beats a  minute.  You have trouble breathing. This information is not intended to replace advice given to you by your health care provider. Make sure you discuss any questions you have with your health care provider. Document Released: 10/03/2009 Document Revised: 06/26/2016 Document Reviewed: 01/31/2016 Elsevier Interactive Patient Education  2017 Elsevier Inc[employer]  

## 2017-05-03 ENCOUNTER — Telehealth: Payer: Self-pay | Admitting: Nurse Practitioner

## 2017-05-03 ENCOUNTER — Ambulatory Visit (HOSPITAL_BASED_OUTPATIENT_CLINIC_OR_DEPARTMENT_OTHER): Payer: PRIVATE HEALTH INSURANCE | Admitting: Nurse Practitioner

## 2017-05-03 ENCOUNTER — Ambulatory Visit (HOSPITAL_COMMUNITY)
Admission: RE | Admit: 2017-05-03 | Discharge: 2017-05-03 | Disposition: A | Discharge status: Home / Self Care | Payer: PRIVATE HEALTH INSURANCE | Source: Ambulatory Visit | Attending: Nurse Practitioner | Admitting: Nurse Practitioner

## 2017-05-03 ENCOUNTER — Encounter: Payer: Self-pay | Admitting: Nurse Practitioner

## 2017-05-03 ENCOUNTER — Ambulatory Visit (HOSPITAL_BASED_OUTPATIENT_CLINIC_OR_DEPARTMENT_OTHER): Payer: PRIVATE HEALTH INSURANCE

## 2017-05-03 ENCOUNTER — Ambulatory Visit (HOSPITAL_COMMUNITY): Admission: RE | Admit: 2017-05-03 | Payer: PRIVATE HEALTH INSURANCE | Source: Ambulatory Visit

## 2017-05-03 VITALS — BP 95/71 | HR 92 | Temp 98.1°F | Resp 16 | Ht 70.0 in | Wt 205.9 lb

## 2017-05-03 DIAGNOSIS — R933 Abnormal findings on diagnostic imaging of other parts of digestive tract: Secondary | ICD-10-CM | POA: Insufficient documentation

## 2017-05-03 DIAGNOSIS — R1030 Lower abdominal pain, unspecified: Secondary | ICD-10-CM

## 2017-05-03 DIAGNOSIS — D6481 Anemia due to antineoplastic chemotherapy: Secondary | ICD-10-CM

## 2017-05-03 DIAGNOSIS — C451 Mesothelioma of peritoneum: Secondary | ICD-10-CM | POA: Insufficient documentation

## 2017-05-03 DIAGNOSIS — R180 Malignant ascites: Secondary | ICD-10-CM | POA: Diagnosis not present

## 2017-05-03 DIAGNOSIS — R1111 Vomiting without nausea: Secondary | ICD-10-CM | POA: Diagnosis not present

## 2017-05-03 DIAGNOSIS — E860 Dehydration: Secondary | ICD-10-CM | POA: Diagnosis not present

## 2017-05-03 DIAGNOSIS — R944 Abnormal results of kidney function studies: Secondary | ICD-10-CM

## 2017-05-03 DIAGNOSIS — K5900 Constipation, unspecified: Secondary | ICD-10-CM

## 2017-05-03 DIAGNOSIS — I959 Hypotension, unspecified: Secondary | ICD-10-CM

## 2017-05-03 DIAGNOSIS — C786 Secondary malignant neoplasm of retroperitoneum and peritoneum: Secondary | ICD-10-CM

## 2017-05-03 DIAGNOSIS — K56600 Partial intestinal obstruction, unspecified as to cause: Secondary | ICD-10-CM | POA: Diagnosis not present

## 2017-05-03 DIAGNOSIS — R42 Dizziness and giddiness: Secondary | ICD-10-CM

## 2017-05-03 DIAGNOSIS — D630 Anemia in neoplastic disease: Secondary | ICD-10-CM

## 2017-05-03 LAB — BASIC METABOLIC PANEL
ANION GAP: 11 meq/L (ref 3–11)
BUN: 24.2 mg/dL (ref 7.0–26.0)
CHLORIDE: 89 meq/L — AB (ref 98–109)
CO2: 26 mEq/L (ref 22–29)
Calcium: 8.6 mg/dL (ref 8.4–10.4)
Creatinine: 2.1 mg/dL — ABNORMAL HIGH (ref 0.7–1.3)
EGFR: 35 mL/min/{1.73_m2} — AB (ref >90)
GLUCOSE: 94 mg/dL (ref 70–140)
Sodium: 126 mEq/L — ABNORMAL LOW (ref 136–145)

## 2017-05-03 NOTE — Progress Notes (Addendum)
[facility] OFFICE PROGRESS NOTE   Diagnosis:  Peritoneal mesothelioma  INTERVAL HISTORY:   Frank Tucker returns as scheduled. He felt some better after receiving IV fluids on Friday and Saturday of last week. He vomited multiple times yesterday. Bowels overall moving regularly until this morning when he noted some constipation. He is not taking Miralax on a regular basis. He is not following a full liquid diet. He continues to have lightheadedness. He has mid to low abdominal pain. He takes oxycodone multiple times a day.  Objective:  Vital signs in last 24 hours:  Blood pressure 95/71, pulse 92, temperature 98.1 F (36.7 C), temperature source Oral, resp. rate 16, height 5\' 10"  (1.778 m), weight 205 lb 14.4 oz (93.4 kg), SpO2 98 %.    HEENT: No thrush or ulcers. Resp: Lungs clear bilaterally. Cardio: Regular rate and rhythm. GI: Abdomen is distended consistent with ascites. Vascular: Pitting edema at the lower legs bilaterally. Neuro: Alert and oriented.  Port-A-Cath without erythema.  Lab Results:  Lab Results  Component Value Date   WBC 18.1 (H) 04/30/2017   HGB 8.9 (L) 04/30/2017   HCT 27.7 (L) 04/30/2017   MCV 99.3 (H) 04/30/2017   PLT 632 (H) 04/30/2017   NEUTROABS 16.2 (H) 04/30/2017    Imaging:  No results found.  Medications: I have reviewed the patient's current medications  Assessment/Plan: 1. Abdominal carcinomatosis-primary peritoneal mesothelioma ? Paracentesis on 12/17/2016 confirmed malignant cells consistent with carcinoma, cytokeratin and WT-1 positive ? CTs of the chest, abdomen, and pelvis with ascites and omental caking. No other evidence of metastatic disease and no primary tumor site identified ? Upper endoscopy 12/29/2016-negative for malignancy ? PET scan 01/01/2017 with extensive hypermetabolic omental caking of tumor with extensive ascites, no primary tumor site identified ? 01/08/2017 omental mass biopsy-malignant  mesothelioma ? Cycle 1 cisplatin/Alimta 01/21/2017 ? Cycle 2 cisplatin/Alimta 02/11/2017 ? Cycle 3 cisplatin/Alimta 03/04/2017 ? Cycle 4 cisplatin/Alimta 03/25/2017 ? CT 04/01/2017-persistent ascites and omental thickening/peritoneal nodularity-Slightly improved, new small bowel obstruction   2. Yeast rash in the axilla and groin.  3. Port-A-Cath placement 01/20/2017  4. Delayed nauseasecondary to chemotherapy-persistent following cycle 2 despite Decadron prophylaxis, improved with cycles 3 with the addition of Reglan  5. Malignant ascites  6. Elevated creatinine-likely secondary to dehydration versus toxicity from cisplatin  7. Neutropenia secondary to chemotherapy-resolved  8. 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 Admission 03/31/2017 with a partial small bowel obstruction-improved with bowel rest           10.   Syncope event at the Cancer center 04/30/2017 secondary to dehydration    Disposition: Frank Tucker continues to have obstructive symptoms. We are referring him for a plain abdominal x-ray today. He was instructed on a full liquid diet. He will resume a laxative.  He continues to have hypotension. We are canceling the paracentesis procedure scheduled today.  He understands he is not a candidate for chemotherapy at this time. Dr. Benay Spice will contact the surgeon at [facility] to discuss the timing of surgery.  He will return to the lab today for a follow-up basic metabolic panel. We will see him in follow-up later this week. He will contact the office in the interim with any problems.  Patient seen with Dr. Benay Spice. 25 minutes were spent face-to-face at today's visit with the majority of that time involved in counseling/coordination of care.  Ned Card ANP/GNP-BC   05/03/2017  9:56 AM  This was a shared visit with Lattie Haw  Frank Tucker. Mr.  Tucker was interviewed and examined. He has persistent obstructive symptoms and evidence of dehydration. I will contact Dr. Johney Maine to discuss surgical options. Frank Tucker is not a candidate for additional chemotherapy unless he is able to maintain adequate hydration.  Julieanne Manson, M.D.

## 2017-05-03 NOTE — Telephone Encounter (Signed)
Scheduled appt per 5/14 los. Gave patient AVS and calender per 5/14 los. Patient aware to head to radiology after labs for xray

## 2017-05-06 ENCOUNTER — Telehealth: Payer: Self-pay | Admitting: *Deleted

## 2017-05-06 ENCOUNTER — Other Ambulatory Visit: Payer: Self-pay | Admitting: Nurse Practitioner

## 2017-05-06 DIAGNOSIS — C451 Mesothelioma of peritoneum: Secondary | ICD-10-CM

## 2017-05-06 NOTE — Telephone Encounter (Signed)
1409: Message from pt reporting he is unable to come in today for lab. "In deposition all day." Returned call to pt, instructed him to come in at 0800 for lab prior to office visit. He voiced understanding.

## 2017-05-07 ENCOUNTER — Ambulatory Visit (HOSPITAL_BASED_OUTPATIENT_CLINIC_OR_DEPARTMENT_OTHER): Payer: PRIVATE HEALTH INSURANCE | Admitting: Nurse Practitioner

## 2017-05-07 ENCOUNTER — Telehealth: Payer: Self-pay | Admitting: Oncology

## 2017-05-07 ENCOUNTER — Ambulatory Visit (HOSPITAL_BASED_OUTPATIENT_CLINIC_OR_DEPARTMENT_OTHER): Payer: PRIVATE HEALTH INSURANCE

## 2017-05-07 VITALS — BP 103/77 | HR 93 | Temp 98.2°F | Resp 18 | Ht 70.0 in | Wt 210.4 lb

## 2017-05-07 DIAGNOSIS — C451 Mesothelioma of peritoneum: Secondary | ICD-10-CM

## 2017-05-07 DIAGNOSIS — R180 Malignant ascites: Secondary | ICD-10-CM

## 2017-05-07 DIAGNOSIS — E875 Hyperkalemia: Secondary | ICD-10-CM

## 2017-05-07 DIAGNOSIS — K56600 Partial intestinal obstruction, unspecified as to cause: Secondary | ICD-10-CM

## 2017-05-07 DIAGNOSIS — D630 Anemia in neoplastic disease: Secondary | ICD-10-CM

## 2017-05-07 DIAGNOSIS — R531 Weakness: Secondary | ICD-10-CM

## 2017-05-07 DIAGNOSIS — R944 Abnormal results of kidney function studies: Secondary | ICD-10-CM

## 2017-05-07 DIAGNOSIS — C786 Secondary malignant neoplasm of retroperitoneum and peritoneum: Secondary | ICD-10-CM | POA: Diagnosis not present

## 2017-05-07 DIAGNOSIS — C801 Malignant (primary) neoplasm, unspecified: Secondary | ICD-10-CM

## 2017-05-07 DIAGNOSIS — D6481 Anemia due to antineoplastic chemotherapy: Secondary | ICD-10-CM

## 2017-05-07 LAB — BASIC METABOLIC PANEL
ANION GAP: 12 meq/L — AB (ref 3–11)
BUN: 24.7 mg/dL (ref 7.0–26.0)
CO2: 23 mEq/L (ref 22–29)
Calcium: 8.4 mg/dL (ref 8.4–10.4)
Chloride: 94 mEq/L — ABNORMAL LOW (ref 98–109)
Creatinine: 2 mg/dL — ABNORMAL HIGH (ref 0.7–1.3)
EGFR: 37 mL/min/{1.73_m2} — AB (ref >90)
Glucose: 112 mg/dl (ref 70–140)
Sodium: 129 mEq/L — ABNORMAL LOW (ref 136–145)

## 2017-05-07 MED ORDER — OXYCODONE-ACETAMINOPHEN 5-325 MG PO TABS: 1.0000 | ORAL_TABLET | ORAL | 0 refills | Status: DC | PRN

## 2017-05-07 NOTE — Telephone Encounter (Signed)
Gave patient avs report and appointments for May. Central radiology will call re scan. °

## 2017-05-07 NOTE — Progress Notes (Addendum)
[facility] OFFICE PROGRESS NOTE   Diagnosis:  Peritoneal mesothelioma  INTERVAL HISTORY:   Mr. Galea returns as scheduled. We referred him for an abdominal x-ray following office visit 05/03/2017. The x-ray was compatible with partial small bowel obstruction. He continues to feel weak. He is able to walk short distances. He notes shortness of breath with exertion. Oral intake is better. He has some nausea but no vomiting. Bowels are moving. He notes increased abdominal distention. He has intermittent abdominal pain and takes Percocet as needed.  Objective:  Vital signs in last 24 hours:  Blood pressure 103/77, pulse 93, temperature 98.2 F (36.8 C), temperature source Oral, resp. rate 18, height 5\' 10"  (1.778 m), weight 210 lb 6.4 oz (95.4 kg), SpO2 100 %.    HEENT: No thrush or ulcers. Mouth appears dry. Resp: Lungs are clear. Breath sounds diminished at the bases bilaterally. No respiratory distress. Cardio: Regular rate and rhythm.  GI: Abdomen is distended consistent with ascites. Vascular: Pitting edema below the knees bilaterally. Neuro: Alert and oriented.  Port-A-Cath without erythema.   Lab Results:  Lab Results  Component Value Date   WBC 18.1 (H) 04/30/2017   HGB 8.9 (L) 04/30/2017   HCT 27.7 (L) 04/30/2017   MCV 99.3 (H) 04/30/2017   PLT 632 (H) 04/30/2017   NEUTROABS 16.2 (H) 04/30/2017    Imaging:  No results found.  Medications: I have reviewed the patient's current medications.  Assessment/Plan: 1. Abdominal carcinomatosis-primary peritoneal mesothelioma ? Paracentesis on 12/17/2016 confirmed malignant cells consistent with carcinoma, cytokeratin and WT-1 positive ? CTs of the chest, abdomen, and pelvis with ascites and omental caking. No other evidence of metastatic disease and no primary tumor site identified ? Upper endoscopy 12/29/2016-negative for malignancy ? PET scan 01/01/2017 with extensive hypermetabolic omental caking of  tumor with extensive ascites, no primary tumor site identified ? 01/08/2017 omental mass biopsy-malignant mesothelioma ? Cycle 1 cisplatin/Alimta 01/21/2017 ? Cycle 2 cisplatin/Alimta 02/11/2017 ? Cycle 3 cisplatin/Alimta 03/04/2017 ? Cycle 4 cisplatin/Alimta 03/25/2017 ? CT 04/01/2017-persistent ascites and omental thickening/peritoneal nodularity-Slightly improved, new small bowel obstruction   2. Yeast rash in the axilla and groin.  3. Port-A-Cath placement 01/20/2017  4. Delayed nauseasecondary to chemotherapy-persistent following cycle 2 despite Decadron prophylaxis, improved with cycles 3 with the addition of Reglan  5. Malignant ascites. Paracentesis when necessary.  6. Elevated creatinine-likely secondary to dehydration versus toxicity from cisplatin  7. Neutropenia secondary to chemotherapy-resolved  8. 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 Admission 03/31/2017 with a partial small bowel obstruction-improved with bowel rest  10. Syncope event at the Cancer center 04/30/2017 secondary to dehydration           11.   Abdominal x-ray 05/03/2017 compatible with mid to distal small bowel ileus or partial small bowel obstruction.           12.   Elevated creatinine and potassium on labs 05/03/2017 and 05/07/2017. Referred for renal ultrasound.    Disposition: Mr. Rodenberg appears unchanged. He understands he is not a candidate for chemotherapy in his current condition. He is scheduled to see the surgeon at [facility] later today. We will await surgery input.  He has recurrent ascites. We will schedule him for a paracentesis early next week limiting the fluid removed to 4 L due to hypotension.  Creatinine and potassium remain elevated. We are referring him for a renal ultrasound to rule out hydronephrosis.  He was provided with a new Percocet  prescription at  today's visit.  He is scheduled to return on 05/11/2017. He will contact the office in the interim with any problems.  Patient seen with Dr. Benay Spice. 25 minutes were spent face-to-face at today's visit with the majority of that time involved in counseling/coordination of care.  Ned Card ANP/GNP-BC   05/07/2017  8:58 AM  This was a shared visit with Ned Card. Mr. Ogle was interviewed and examined. The obstructive symptoms appear improved. He has persistent elevation of creatinine. The potassium is mildly elevated. We will refer him for a renal ultrasound. He is scheduled to see Dr.`Votanopoulos today. Mr. Deboard will be be scheduled for a palliative paracentesis next week. He will return for an office visit 05/11/2017.  Julieanne Manson, M.D.

## 2017-05-10 ENCOUNTER — Ambulatory Visit (HOSPITAL_COMMUNITY)
Admission: RE | Admit: 2017-05-10 | Discharge: 2017-05-10 | Disposition: A | Discharge status: Home / Self Care | Payer: PRIVATE HEALTH INSURANCE | Source: Ambulatory Visit | Attending: Nurse Practitioner | Admitting: Nurse Practitioner

## 2017-05-10 ENCOUNTER — Encounter (HOSPITAL_COMMUNITY): Payer: Self-pay | Admitting: Student

## 2017-05-10 DIAGNOSIS — C451 Mesothelioma of peritoneum: Secondary | ICD-10-CM | POA: Diagnosis not present

## 2017-05-10 DIAGNOSIS — R188 Other ascites: Secondary | ICD-10-CM | POA: Diagnosis present

## 2017-05-10 HISTORY — PX: IR PARACENTESIS: IMG2679

## 2017-05-10 MED ORDER — LIDOCAINE HCL 1 % IJ SOLN
INTRAMUSCULAR | Status: DC | PRN
  Administered 2017-05-10: 10 mL

## 2017-05-10 MED ORDER — LIDOCAINE HCL 1 % IJ SOLN
INTRAMUSCULAR | Status: AC
  Filled 2017-05-10: qty 20

## 2017-05-10 NOTE — Procedures (Signed)
PROCEDURE SUMMARY:  Successful US guided paracentesis from right lateral abdomen.  Yielded 4.0 liters of clear, yellow fluid.  No immediate complications.  Pt tolerated well.   Specimen was not sent for labs.  Docia Barrier PA-C 05/10/2017 3:28 PM

## 2017-05-11 ENCOUNTER — Other Ambulatory Visit: Payer: Self-pay | Admitting: *Deleted

## 2017-05-11 ENCOUNTER — Other Ambulatory Visit (HOSPITAL_BASED_OUTPATIENT_CLINIC_OR_DEPARTMENT_OTHER): Payer: PRIVATE HEALTH INSURANCE

## 2017-05-11 ENCOUNTER — Ambulatory Visit (HOSPITAL_BASED_OUTPATIENT_CLINIC_OR_DEPARTMENT_OTHER): Payer: PRIVATE HEALTH INSURANCE

## 2017-05-11 ENCOUNTER — Encounter: Payer: Self-pay | Admitting: *Deleted

## 2017-05-11 ENCOUNTER — Ambulatory Visit (HOSPITAL_BASED_OUTPATIENT_CLINIC_OR_DEPARTMENT_OTHER): Payer: PRIVATE HEALTH INSURANCE | Admitting: Oncology

## 2017-05-11 ENCOUNTER — Telehealth: Payer: Self-pay | Admitting: Oncology

## 2017-05-11 VITALS — BP 98/62 | HR 80 | Temp 97.8°F | Resp 18 | Ht 70.0 in | Wt 201.6 lb

## 2017-05-11 DIAGNOSIS — D630 Anemia in neoplastic disease: Secondary | ICD-10-CM

## 2017-05-11 DIAGNOSIS — C451 Mesothelioma of peritoneum: Secondary | ICD-10-CM

## 2017-05-11 DIAGNOSIS — C786 Secondary malignant neoplasm of retroperitoneum and peritoneum: Secondary | ICD-10-CM

## 2017-05-11 DIAGNOSIS — Z95828 Presence of other vascular implants and grafts: Secondary | ICD-10-CM

## 2017-05-11 DIAGNOSIS — K56600 Partial intestinal obstruction, unspecified as to cause: Secondary | ICD-10-CM

## 2017-05-11 DIAGNOSIS — C180 Malignant neoplasm of cecum: Secondary | ICD-10-CM

## 2017-05-11 DIAGNOSIS — C801 Malignant (primary) neoplasm, unspecified: Principal | ICD-10-CM

## 2017-05-11 DIAGNOSIS — D6481 Anemia due to antineoplastic chemotherapy: Secondary | ICD-10-CM

## 2017-05-11 DIAGNOSIS — E8809 Other disorders of plasma-protein metabolism, not elsewhere classified: Secondary | ICD-10-CM

## 2017-05-11 LAB — CBC WITH DIFFERENTIAL/PLATELET
BASO%: 0.2 % (ref 0.0–2.0)
BASOS ABS: 0 10*3/uL (ref 0.0–0.1)
EOS%: 0.3 % (ref 0.0–7.0)
Eosinophils Absolute: 0 10*3/uL (ref 0.0–0.5)
HCT: 26.8 % — ABNORMAL LOW (ref 38.4–49.9)
HEMOGLOBIN: 8.5 g/dL — AB (ref 13.0–17.1)
LYMPH%: 9.4 % — AB (ref 14.0–49.0)
MCH: 32.3 pg (ref 27.2–33.4)
MCHC: 31.7 g/dL — ABNORMAL LOW (ref 32.0–36.0)
MCV: 101.9 fL — AB (ref 79.3–98.0)
MONO#: 1.2 10*3/uL — AB (ref 0.1–0.9)
MONO%: 10.5 % (ref 0.0–14.0)
NEUT%: 79.6 % — ABNORMAL HIGH (ref 39.0–75.0)
NEUTROS ABS: 9.1 10*3/uL — AB (ref 1.5–6.5)
PLATELETS: 619 10*3/uL — AB (ref 140–400)
RBC: 2.63 10*6/uL — ABNORMAL LOW (ref 4.20–5.82)
RDW: 15.6 % — AB (ref 11.0–14.6)
WBC: 11.5 10*3/uL — AB (ref 4.0–10.3)
lymph#: 1.1 10*3/uL (ref 0.9–3.3)

## 2017-05-11 LAB — COMPREHENSIVE METABOLIC PANEL
ALBUMIN: 1.3 g/dL — AB (ref 3.5–5.0)
ALK PHOS: 130 U/L (ref 40–150)
ALT: 7 U/L (ref 0–55)
ANION GAP: 8 meq/L (ref 3–11)
AST: 15 U/L (ref 5–34)
BUN: 26.4 mg/dL — ABNORMAL HIGH (ref 7.0–26.0)
CO2: 26 mEq/L (ref 22–29)
Calcium: 8.4 mg/dL (ref 8.4–10.4)
Chloride: 94 mEq/L — ABNORMAL LOW (ref 98–109)
Creatinine: 2 mg/dL — ABNORMAL HIGH (ref 0.7–1.3)
EGFR: 37 mL/min/{1.73_m2} — AB (ref >90)
Glucose: 92 mg/dl (ref 70–140)
POTASSIUM: 5.1 meq/L (ref 3.5–5.1)
Sodium: 128 mEq/L — ABNORMAL LOW (ref 136–145)
TOTAL PROTEIN: 5.1 g/dL — AB (ref 6.4–8.3)

## 2017-05-11 LAB — MAGNESIUM: Magnesium: 1.5 mg/dl (ref 1.5–2.5)

## 2017-05-11 MED ORDER — ONDANSETRON HCL 8 MG PO TABS: 8.0000 mg | ORAL_TABLET | Freq: Three times a day (TID) | ORAL | 2 refills | Status: AC | PRN

## 2017-05-11 MED ORDER — SODIUM CHLORIDE 0.9% FLUSH
10.0000 mL | Freq: Once | INTRAVENOUS | Status: AC
  Administered 2017-05-11: 10 mL
  Filled 2017-05-11: qty 10

## 2017-05-11 MED ORDER — LORAZEPAM 1 MG PO TABS: 1.0000 mg | ORAL_TABLET | Freq: Four times a day (QID) | ORAL | 0 refills | Status: DC | PRN

## 2017-05-11 MED ORDER — HEPARIN SOD (PORK) LOCK FLUSH 100 UNIT/ML IV SOLN
500.0000 [IU] | Freq: Once | INTRAVENOUS | Status: AC
  Administered 2017-05-11: 500 [IU]
  Filled 2017-05-11: qty 5

## 2017-05-11 NOTE — Progress Notes (Signed)
[facility] OFFICE PROGRESS NOTE   Diagnosis: Peritoneal mesothelioma  INTERVAL HISTORY:   Mr. Hurn returns as scheduled. He contains have intermittent nausea/vomiting. He is having bowel movements. He saw Dr. Johney Maine on 05/07/2017 (CTs the office note is not available today). Mr. Schiele reports the surgeon described significant potential morbidity with surgical intervention. Mr. Schrom has decided against surgery. He underwent a paracentesis for 4 L of fluid yesterday. A renal ultrasound yesterday revealed no hydronephrosis.  Objective:  Vital signs in last 24 hours:  Blood pressure 98/62, pulse 80, temperature 97.8 F (36.6 C), resp. rate 18, height 5\' 10"  (1.778 m), weight 201 lb 9.6 oz (91.4 kg), SpO2 99 %.    HEENT: Mild whitecoat over the tongue, no buccal thrush Resp: Lungs clear bilaterally Cardio: Regular rate and rhythm GI: The abdomen is distended with ascites, no mass Vascular: 1+ pitting edema at the lower leg bilaterally   Portacath/PICC-without erythema  Lab Results:  Lab Results  Component Value Date   WBC 11.5 (H) 05/11/2017   HGB 8.5 (L) 05/11/2017   HCT 26.8 (L) 05/11/2017   MCV 101.9 (H) 05/11/2017   PLT 619 (H) 05/11/2017   NEUTROABS 9.1 (H) 05/11/2017    CMP     Component Value Date/Time   NA 128 (L) 05/11/2017 1014   K 5.1 05/11/2017 1014   CL 102 04/08/2017 0528   CO2 26 05/11/2017 1014   GLUCOSE 92 05/11/2017 1014   BUN 26.4 (H) 05/11/2017 1014   CREATININE 2.0 (H) 05/11/2017 1014   CALCIUM 8.4 05/11/2017 1014   PROT 5.1 (L) 05/11/2017 1014   ALBUMIN 1.3 (L) 05/11/2017 1014   AST 15 05/11/2017 1014   ALT 7 05/11/2017 1014   ALKPHOS 130 05/11/2017 1014   BILITOT <0.22 05/11/2017 1014   GFRNONAA >60 04/08/2017 0528   GFRAA >60 04/08/2017 0528    Imaging:  US Renal  Result Date: 05/10/2017 CLINICAL DATA:  56 year old male with peritoneal mesothelioma. Elevated creatinine. Subsequent encounter. EXAM: RENAL /  URINARY TRACT ULTRASOUND COMPLETE COMPARISON:  03/31/2017 CT. FINDINGS: Right Kidney: Length: 10.5 cm. Echogenicity within normal limits. No mass or hydronephrosis visualized. Left Kidney: Length: 11 cm. No hydronephrosis. Lower pole 9 mm echogenic focus of indeterminate etiology. Bladder: Appears normal for degree of bladder distention. Ascites.  Slightly lobulated prostate gland. IMPRESSION: No evidence hydronephrosis. 9 mm nonspecific lesion inferior aspect left kidney. No calcification seen on prior CT at this level. Low-density structure noted on prior CT and therefore this may represent a complex cyst. Alternatively this may represent focal indentation of fat. This did not appear hypermetabolic on 81/19/1478 PET-CT. Ascites. Slightly lobulated prostate gland. Electronically Signed   By: Genia Del M.D.   On: 05/10/2017 20:07   Ir Paracentesis  Result Date: 05/10/2017 INDICATION: Recurrent ascites.  Request is made for therapeutic paracentesis. EXAM: ULTRASOUND GUIDED THERAPEUTIC PARACENTESIS MEDICATIONS: 10 mL 1% lidocaine COMPLICATIONS: None immediate. PROCEDURE: Informed written consent was obtained from the patient after a discussion of the risks, benefits and alternatives to treatment. A timeout was performed prior to the initiation of the procedure. Initial ultrasound scanning demonstrates a large amount of ascites within the right lateral abdomen. The right lateral abdomen was prepped and draped in the usual sterile fashion. 1% lidocaine was used for local anesthesia. Following this, a 19 gauge, 7-cm, Yueh catheter was introduced. An ultrasound image was saved for documentation purposes. The paracentesis was performed. The catheter was removed and a dressing was applied. The patient tolerated  the procedure well without immediate post procedural complication. FINDINGS: A total of approximately 4.0 liters of clear, yellow fluid was removed. IMPRESSION: Successful ultrasound-guided therapeutic  paracentesis yielding 4.0 liters of peritoneal fluid. Read by:  Brynda Greathouse PA-C Electronically Signed   By: Markus Daft M.D.   On: 05/10/2017 15:28    Medications: I have reviewed the patient's current medications.  Assessment/Plan: 1. Abdominal carcinomatosis-primary peritoneal mesothelioma ? Paracentesis on 12/17/2016 confirmed malignant cells consistent with carcinoma, cytokeratin and WT-1 positive ? CTs of the chest, abdomen, and pelvis with ascites and omental caking. No other evidence of metastatic disease and no primary tumor site identified ? Upper endoscopy 12/29/2016-negative for malignancy ? PET scan 01/01/2017 with extensive hypermetabolic omental caking of tumor with extensive ascites, no primary tumor site identified ? 01/08/2017 omental mass biopsy-malignant mesothelioma ? Cycle 1 cisplatin/Alimta 01/21/2017 ? Cycle 2 cisplatin/Alimta 02/11/2017 ? Cycle 3 cisplatin/Alimta 03/04/2017 ? Cycle 4 cisplatin/Alimta 03/25/2017 ? CT 04/01/2017-persistent ascites and omental thickening/peritoneal nodularity-Slightly improved, new small bowel obstruction   2. Yeast rash in the axilla and groin.  3. Port-A-Cath placement 01/20/2017  4. Delayed nauseasecondary to chemotherapy-persistent following cycle 2 despite Decadron prophylaxis, improved with cycles 3 with the addition of Reglan  5. Malignant ascites. Paracentesis when necessary.  6. Elevated creatinine-likely secondary to dehydration versus toxicity from cisplatin                   Renal ultrasound 05/10/2017-no hydronephrosis  7. Neutropenia secondary to chemotherapy-resolved  8. 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 Admission 03/31/2017 with a partial small bowel obstruction-improved with bowel rest  10. Syncope event at the Cancer center 04/30/2017 secondary to  dehydration           11.   Abdominal x-ray 05/03/2017 compatible with mid to distal small bowel ileus or partial small bowel obstruction.   Disposition:  Mr. Franze continues to have obstructive symptoms. He has decided against surgery. His performance status is poor and he has hypoalbuminemia. He is not a candidate for salvage systemic therapy in his current condition.  I discussed the prognosis and treatment options with Mr. Serviss and his family. He understands no therapy will be curative. We decided to begin home TPN with the hope of improving his nutritional status. He may be a candidate for salvage systemic therapy or surgery in the future. I think it is reasonable to begin TPN in his case. He does not have visceral organ involvement with metastatic disease.  30 minutes were spent with the patient today. The majority of the time was used for counseling and coordination of care.  Betsy Coder, MD  05/11/2017  11:35 AM

## 2017-05-11 NOTE — Telephone Encounter (Signed)
Follow up with Dr Benay Spice, scheduled for 05/18/17, per 05/11/17 los. Patient was given a copy of the AVS report and appointment schedule per 05/11/17 los.

## 2017-05-11 NOTE — Progress Notes (Signed)
Debbie at [facility] notified regarding home TPN.  Requested paperwork faxed.  Call received from [facility] that paperwork is efficient for start for TPN tomorrow.

## 2017-05-12 ENCOUNTER — Telehealth: Payer: Self-pay | Admitting: *Deleted

## 2017-05-12 NOTE — Telephone Encounter (Signed)
Message received from [facility] with [facility] pharmacy requesting call back regarding TPN and request for pt to receive one liter of NS prior to TPN today only.  Dr. Benay Spice notified and OK for one liter of NS prior to TPN today only.  Call placed back to [facility] with [facility] pharmacy to notify her of MD instructions.

## 2017-05-13 ENCOUNTER — Telehealth: Payer: Self-pay

## 2017-05-13 NOTE — Telephone Encounter (Signed)
Pt called that [facility] told him it was too soon to refill his percocet. Called [facility] and they had spoken with someone yesterday and it is ready for pickup. Called pt back.

## 2017-05-14 ENCOUNTER — Encounter (HOSPITAL_COMMUNITY): Payer: Self-pay | Admitting: Emergency Medicine

## 2017-05-14 ENCOUNTER — Inpatient Hospital Stay (HOSPITAL_COMMUNITY)
Admission: EM | Admit: 2017-05-14 | Discharge: 2017-05-15 | DRG: 374 | Disposition: A | Discharge status: Home / Self Care | Payer: PRIVATE HEALTH INSURANCE | Attending: Internal Medicine | Admitting: Internal Medicine

## 2017-05-14 ENCOUNTER — Emergency Department (HOSPITAL_COMMUNITY): Payer: PRIVATE HEALTH INSURANCE

## 2017-05-14 DIAGNOSIS — R180 Malignant ascites: Secondary | ICD-10-CM | POA: Diagnosis present

## 2017-05-14 DIAGNOSIS — R112 Nausea with vomiting, unspecified: Secondary | ICD-10-CM

## 2017-05-14 DIAGNOSIS — D631 Anemia in chronic kidney disease: Secondary | ICD-10-CM | POA: Diagnosis present

## 2017-05-14 DIAGNOSIS — K529 Noninfective gastroenteritis and colitis, unspecified: Secondary | ICD-10-CM | POA: Diagnosis present

## 2017-05-14 DIAGNOSIS — R140 Abdominal distension (gaseous): Secondary | ICD-10-CM

## 2017-05-14 DIAGNOSIS — C482 Malignant neoplasm of peritoneum, unspecified: Secondary | ICD-10-CM | POA: Diagnosis not present

## 2017-05-14 DIAGNOSIS — N183 Chronic kidney disease, stage 3 (moderate): Secondary | ICD-10-CM | POA: Diagnosis present

## 2017-05-14 DIAGNOSIS — E871 Hypo-osmolality and hyponatremia: Secondary | ICD-10-CM | POA: Diagnosis present

## 2017-05-14 DIAGNOSIS — R109 Unspecified abdominal pain: Secondary | ICD-10-CM | POA: Insufficient documentation

## 2017-05-14 DIAGNOSIS — C801 Malignant (primary) neoplasm, unspecified: Secondary | ICD-10-CM | POA: Diagnosis not present

## 2017-05-14 DIAGNOSIS — C451 Mesothelioma of peritoneum: Secondary | ICD-10-CM | POA: Diagnosis present

## 2017-05-14 DIAGNOSIS — Z820 Family history of epilepsy and other diseases of the nervous system: Secondary | ICD-10-CM | POA: Diagnosis not present

## 2017-05-14 DIAGNOSIS — E43 Unspecified severe protein-calorie malnutrition: Secondary | ICD-10-CM | POA: Diagnosis present

## 2017-05-14 DIAGNOSIS — D539 Nutritional anemia, unspecified: Secondary | ICD-10-CM | POA: Diagnosis present

## 2017-05-14 DIAGNOSIS — R52 Pain, unspecified: Secondary | ICD-10-CM

## 2017-05-14 DIAGNOSIS — R1084 Generalized abdominal pain: Secondary | ICD-10-CM

## 2017-05-14 DIAGNOSIS — G8929 Other chronic pain: Secondary | ICD-10-CM | POA: Diagnosis present

## 2017-05-14 DIAGNOSIS — Z808 Family history of malignant neoplasm of other organs or systems: Secondary | ICD-10-CM | POA: Diagnosis not present

## 2017-05-14 DIAGNOSIS — Z8249 Family history of ischemic heart disease and other diseases of the circulatory system: Secondary | ICD-10-CM | POA: Diagnosis not present

## 2017-05-14 DIAGNOSIS — C786 Secondary malignant neoplasm of retroperitoneum and peritoneum: Secondary | ICD-10-CM

## 2017-05-14 DIAGNOSIS — R110 Nausea: Secondary | ICD-10-CM | POA: Diagnosis not present

## 2017-05-14 LAB — URINALYSIS, ROUTINE W REFLEX MICROSCOPIC
BILIRUBIN URINE: NEGATIVE
Glucose, UA: NEGATIVE mg/dL
Hgb urine dipstick: NEGATIVE
Ketones, ur: NEGATIVE mg/dL
LEUKOCYTES UA: NEGATIVE
NITRITE: NEGATIVE
PH: 5 (ref 5.0–8.0)
Protein, ur: NEGATIVE mg/dL
SPECIFIC GRAVITY, URINE: 1.028 (ref 1.005–1.030)

## 2017-05-14 LAB — COMPREHENSIVE METABOLIC PANEL
ALBUMIN: 1.6 g/dL — AB (ref 3.5–5.0)
ALK PHOS: 114 U/L (ref 38–126)
ALT: 10 U/L — ABNORMAL LOW (ref 17–63)
ANION GAP: 12 (ref 5–15)
AST: 17 U/L (ref 15–41)
BILIRUBIN TOTAL: 0.3 mg/dL (ref 0.3–1.2)
BUN: 21 mg/dL — ABNORMAL HIGH (ref 6–20)
CALCIUM: 8.4 mg/dL — AB (ref 8.9–10.3)
CO2: 22 mmol/L (ref 22–32)
Chloride: 94 mmol/L — ABNORMAL LOW (ref 101–111)
Creatinine, Ser: 1.73 mg/dL — ABNORMAL HIGH (ref 0.61–1.24)
GFR calc Af Amer: 50 mL/min — ABNORMAL LOW (ref >60)
GFR, EST NON AFRICAN AMERICAN: 43 mL/min — AB (ref >60)
GLUCOSE: 119 mg/dL — AB (ref 65–99)
POTASSIUM: 4.6 mmol/L (ref 3.5–5.1)
Sodium: 128 mmol/L — ABNORMAL LOW (ref 135–145)
TOTAL PROTEIN: 5.9 g/dL — AB (ref 6.5–8.1)

## 2017-05-14 LAB — CBC
HEMATOCRIT: 29.4 % — AB (ref 39.0–52.0)
HEMOGLOBIN: 9.6 g/dL — AB (ref 13.0–17.0)
MCH: 32.2 pg (ref 26.0–34.0)
MCHC: 32.7 g/dL (ref 30.0–36.0)
MCV: 98.7 fL (ref 78.0–100.0)
Platelets: 714 10*3/uL — ABNORMAL HIGH (ref 150–400)
RBC: 2.98 MIL/uL — ABNORMAL LOW (ref 4.22–5.81)
RDW: 15.2 % (ref 11.5–15.5)
WBC: 12.5 10*3/uL — AB (ref 4.0–10.5)

## 2017-05-14 LAB — I-STAT CG4 LACTIC ACID, ED: LACTIC ACID, VENOUS: 1.67 mmol/L (ref 0.5–1.9)

## 2017-05-14 LAB — LIPASE, BLOOD: Lipase: 31 U/L (ref 11–51)

## 2017-05-14 MED ORDER — IOPAMIDOL (ISOVUE-300) INJECTION 61%
30.0000 mL | Freq: Once | INTRAVENOUS | Status: AC | PRN
  Administered 2017-05-14: 30 mL via ORAL

## 2017-05-14 MED ORDER — ONDANSETRON HCL 4 MG/2ML IJ SOLN
4.0000 mg | Freq: Once | INTRAMUSCULAR | Status: AC | PRN
  Administered 2017-05-14: 4 mg via INTRAVENOUS
  Filled 2017-05-14: qty 2

## 2017-05-14 MED ORDER — METOCLOPRAMIDE HCL 5 MG/ML IJ SOLN
10.0000 mg | Freq: Once | INTRAMUSCULAR | Status: AC
  Administered 2017-05-14: 10 mg via INTRAVENOUS
  Filled 2017-05-14: qty 2

## 2017-05-14 MED ORDER — HYDROMORPHONE HCL 1 MG/ML IJ SOLN
1.0000 mg | Freq: Once | INTRAMUSCULAR | Status: AC
  Administered 2017-05-14: 1 mg via INTRAVENOUS
  Filled 2017-05-14: qty 1

## 2017-05-14 MED ORDER — IOPAMIDOL (ISOVUE-300) INJECTION 61%
INTRAVENOUS | Status: AC
  Filled 2017-05-14: qty 30

## 2017-05-14 NOTE — ED Notes (Signed)
Pt unable to give urine sample, but aware that we need one.  

## 2017-05-14 NOTE — ED Triage Notes (Signed)
Patient states that believes last chemo treatment was in April. Patient starting have severe abd pain with n/v today.

## 2017-05-14 NOTE — ED Provider Notes (Signed)
[facility] DEPT Provider Note   CSN: [id number] Arrival date & time: 05/14/17  1814     History   Chief Complaint Chief Complaint  Patient presents with  . Abdominal Pain  . Emesis    HPI Frank Tucker is a 56 y.o. male.  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 Patient states he's never had pain this bad before. He chronically has abdominal pain intermittently but today is much worse. This started spontaneously. He states his abdomen feels very distended and hard. The pain is localized throughout the abdomen and no particular one area that hurts worse. He denies fever but has had some mild vomiting. No diarrhea. Last paracentesis was 05/10/2017 where 4 L were removed.   The history is provided by the patient.  Abdominal Pain   This is a new problem. The current episode started 1 to 2 hours ago. The problem occurs constantly. The problem has not changed since onset.The pain is associated with an unknown factor. The pain is located in the generalized abdominal region. The quality of the pain is sharp, shooting and throbbing. The pain is at a severity of 10/10. The pain is severe. Associated symptoms include anorexia, belching, nausea and vomiting. Pertinent negatives include fever, diarrhea and dysuria. Nothing aggravates the symptoms. Nothing relieves the symptoms.  Emesis   Associated symptoms include abdominal pain. Pertinent negatives include no diarrhea and no fever.    Past Medical History:  Diagnosis Date  . Ascites   . Mesothelioma of peritoneum ([place]) 01/08/2017  . Peritoneal carcinomatosis [facility])     Patient Active Problem List   Diagnosis Date Noted  . Nausea with vomiting 03/31/2017  . Dehydration 03/31/2017  . Chemotherapy  induced neutropenia ([place]) 03/31/2017  . Unintentional weight loss 03/31/2017  . 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 Hyponatremia 03/31/2017  . AKI (acute kidney injury) ([place]) 03/31/2017  . Port catheter in place 01/29/2017  . Mesothelioma of peritoneum ([place]) 01/08/2017  . Peritoneal carcinomatosis ([place]) 12/16/2016  . Malignant ascites 12/16/2016    Past Surgical History:  Procedure Laterality Date  . ESOPHAGOGASTRODUODENOSCOPY (EGD) WITH PROPOFOL N/A 12/29/2016   Procedure: ESOPHAGOGASTRODUODENOSCOPY (EGD) WITH PROPOFOL;  Surgeon: Doran Stabler, MD;  Location: [facility] ENDOSCOPY;  Service: Gastroenterology;  Laterality: N/A;  . IR GENERIC HISTORICAL  01/20/2017   IR FLUORO GUIDE PORT INSERTION RIGHT 01/20/2017 Sandi Mariscal, MD [facility]-INTERV RAD  . IR GENERIC HISTORICAL  01/20/2017   IR US GUIDE VASC ACCESS RIGHT 01/20/2017 Sandi Mariscal, MD [facility]INTER[facility]  . IR PARACENTESIS  05/10/2017  . PARACENTESIS  12/25/2016       Home Medications    Prior to Admission medications   Medication Sig Start Date End Date Taking? Authorizing Provider  bisacodyl (DULCOLAX) 10 MG suppository Place 1 suppository (10 mg total) rectally every 12 (twelve) hours as needed for mild constipation or moderate constipation. 04/08/17  Yes Barton Dubois, MD  dexamethasone (DECADRON) 4 MG tablet Take 1 tablet (4 mg total) by mouth 2 (two) times daily. For 3 days. Begin day after chemo. 03/22/17  Yes Ladell Pier, MD  feeding supplement (BOOST HIGH PROTEIN) LIQD Take 237 mLs by mouth 3 (three) times daily between meals. 04/08/17  Yes Barton Dubois, MD  LORazepam (ATIVAN) 1 MG tablet Take 1 tablet (1 mg total)  by mouth every 6 (six) hours as needed (nausea). DO NOT DRIVE [birth year]  Yes Ladell Pier, MD  magnesium oxide (MAG-OX) 400 MG tablet Take 1 tablet (400 mg total) by mouth 2 (two) times daily. 04/26/17  Yes Ladell Pier, MD  nystatin (MYCOSTATIN/NYSTOP) powder Apply topically 2 (two) times daily. To under arm rash  03/04/17  Yes Ladell Pier, MD  ondansetron (ZOFRAN) 8 MG tablet Take 1 tablet (8 mg total) by mouth every 8 (eight) hours as needed for nausea or vomiting. Begin 3 days after chemo. 05/11/17  Yes Ladell Pier, MD  oxyCODONE-acetaminophen (PERCOCET/ROXICET) 5-325 MG tablet Take 1 tablet by mouth every 4 (four) hours as needed for severe pain. 05/07/17  Yes Owens Shark, NP  pantoprazole (PROTONIX) 40 MG tablet Take 1 tablet (40 mg total) by mouth 2 (two) times daily. 04/08/17  Yes Barton Dubois, MD  polyethylene glycol [facility] / GLYCOLAX) packet Take 17 g by mouth daily. 04/09/17  Yes Barton Dubois, MD  acetaminophen (TYLENOL) 325 MG tablet Take 2 tablets (650 mg total) by mouth every 6 (six) hours as needed for mild pain or headache (or Fever >/= 101). Patient not taking: Reported on 05/14/2017 04/08/17   Barton Dubois, MD  folic acid (FOLVITE) 1 MG tablet Take 1 tablet (1 mg total) by mouth daily. Patient not taking: Reported on 05/14/2017 01/12/17   Owens Shark, NP  lidocaine-prilocaine (EMLA) cream Apply to portacath site 1 hour prior to use. Patient not taking: Reported on 05/14/2017 01/12/17   Owens Shark, NP  metoCLOPramide (REGLAN) 10 MG tablet Take 1 tablet (10 mg total) by mouth 3 (three) times daily before meals. DO NOT TAKE COMPAZINE. Patient not taking: Reported on 05/14/2017 03/18/17   Ladell Pier, MD    Family History Family History  Problem Relation Age of Onset  . Brain cancer Mother   . CAD Father     Social History Social History  Substance Use Topics  . Smoking status: Never Smoker  . Smokeless tobacco: Never Used  . Alcohol use No     Allergies   Patient has no known allergies.   Review of Systems Review of Systems  Constitutional: Negative for fever.  Gastrointestinal: Positive for abdominal pain, anorexia, nausea and vomiting. Negative for diarrhea.  Genitourinary: Negative for dysuria.  All other systems reviewed and are  negative.    Physical Exam Updated Vital Signs BP 121/83 (BP Location: Left Arm)   Pulse 99   Temp 97.5 F (36.4 C) (Oral)   Resp (!) 22   Ht 5' 9.5" (1.765 m)   Wt 90.7 kg (200 lb)   SpO2 98%   BMI 29.11 kg/m   Physical Exam  Constitutional: He is oriented to person, place, and time. He appears well-developed and well-nourished. He appears distressed.  Appears in pain  HENT:  Head: Normocephalic and atraumatic.  Mouth/Throat: Mucous membranes are dry.  Eyes: Conjunctivae and EOM are normal. Pupils are equal, round, and reactive to light.  Neck: Normal range of motion. Neck supple.  Cardiovascular: Normal rate, regular rhythm and intact distal pulses.   No murmur heard. Pulmonary/Chest: Effort normal and breath sounds normal. No respiratory distress. He has no wheezes. He has no rales.  Abdominal: Soft. He exhibits distension. There is tenderness. There is no rebound and no guarding.  Firmness and distention throughout the abd.  Tender to touch throughout the abd.  No bowel sounds present  Musculoskeletal: Normal range of motion.  He exhibits no edema or tenderness.  Neurological: He is alert and oriented to person, place, and time.  Skin: Skin is warm and dry. No rash noted. No erythema. There is pallor.  Psychiatric: He has a normal mood and affect. His behavior is normal.  Nursing note and vitals reviewed.    ED Treatments / Results  Labs (all labs ordered are listed, but only abnormal results are displayed) Labs Reviewed  COMPREHENSIVE METABOLIC PANEL - Abnormal; Notable for the following:       Result Value   Sodium 128 (*)    Chloride 94 (*)    Glucose, Bld 119 (*)    BUN 21 (*)    Creatinine, Ser 1.73 (*)    Calcium 8.4 (*)    Total Protein 5.9 (*)    Albumin 1.6 (*)    ALT 10 (*)    GFR calc non Af Amer 43 (*)    GFR calc Af Amer 50 (*)    All other components within normal limits  CBC - Abnormal; Notable for the following:    WBC 12.5 (*)    RBC 2.98  (*)    Hemoglobin 9.6 (*)    HCT 29.4 (*)    Platelets 714 (*)    All other components within normal limits  LIPASE, BLOOD  URINALYSIS, ROUTINE W REFLEX MICROSCOPIC  I-STAT CG4 LACTIC ACID, ED    EKG  EKG Interpretation None       Radiology Ct Abdomen Pelvis Wo Contrast  Result Date: 05/14/2017 CLINICAL DATA:  Acute onset of severe generalized abdominal pain, nausea and vomiting. Initial encounter. EXAM: CT ABDOMEN AND PELVIS WITHOUT CONTRAST TECHNIQUE: Multidetector CT imaging of the abdomen and pelvis was performed following the standard protocol without IV contrast. COMPARISON:  CT of the abdomen and pelvis performed 03/31/2017 and 12/16/2016 FINDINGS: Lower chest: Trace bilateral pleural effusions are noted, left greater than right, with associated atelectasis. The visualized portions of the mediastinum are grossly unremarkable. There is apparent wall thickening at the distal esophagus, better characterized on coronal images. An underlying mass cannot be excluded. Endoscopy could be considered for further evaluation. Hepatobiliary: The liver is unremarkable in appearance. The gallbladder is unremarkable in appearance. The common bile duct remains normal in caliber. Pancreas: The pancreas is within normal limits. Spleen: The spleen is unremarkable in appearance. Adrenals/Urinary Tract: The adrenal glands are unremarkable in appearance. Nonspecific perinephric stranding is noted bilaterally. The kidneys are otherwise unremarkable in appearance. There is no evidence of hydronephrosis. No renal or ureteral stones are identified. Stomach/Bowel: The stomach is unremarkable in appearance. There is wall thickening along the visualized small bowel, raising question for infectious or inflammatory ileitis. The appendix is normal in caliber, without evidence of appendicitis. The patient's peritoneal carcinomatosis abuts the splenic flexure of the colon. The colon is otherwise unremarkable in appearance.  Vascular/Lymphatic: The abdominal aorta is unremarkable in appearance. The inferior vena cava is grossly unremarkable. No retroperitoneal lymphadenopathy is seen. No pelvic sidewall lymphadenopathy is identified. Reproductive: The bladder is mildly distended and grossly unremarkable. The prostate remains borderline normal in size. Other: Diffuse peritoneal carcinomatosis is noted within the abdomen and pelvis. Large volume ascites is noted throughout the abdomen and pelvis. Musculoskeletal: No acute osseous abnormalities are identified. The visualized musculature is unremarkable in appearance. IMPRESSION: 1. Diffuse peritoneal carcinomatosis within the abdomen and pelvis. 2. Large volume ascites noted throughout the abdomen and pelvis. 3. Peritoneal carcinomatosis abuts the splenic flexure of the colon. Colon otherwise unremarkable. 4.  Wall thickening along the visualized small bowel, raising question for infectious or inflammatory ileitis. 5. Trace bilateral pleural effusions, left greater than right, with associated atelectasis. 6. Apparent wall thickening at the distal esophagus, better characterized on coronal images. This appears to be new from December. Underlying mass cannot be excluded. Endoscopy could be considered for further evaluation. Electronically Signed   By: Garald Balding M.D.   On: 05/14/2017 22:37    Procedures Procedures (including critical care time)  Medications Ordered in ED Medications  iopamidol (ISOVUE-300) 61 % injection (not administered)  ondansetron (ZOFRAN) injection 4 mg (4 mg Intravenous Given 05/14/17 1849)  HYDROmorphone (DILAUDID) injection 1 mg (1 mg Intravenous Given 05/14/17 1849)  HYDROmorphone (DILAUDID) injection 1 mg (1 mg Intravenous Given 05/14/17 2120)  metoCLOPramide (REGLAN) injection 10 mg (10 mg Intravenous Given 05/14/17 2120)  iopamidol (ISOVUE-300) 61 % injection 30 mL (30 mLs Oral Contrast Given 05/14/17 2035)     Initial Impression / Assessment and  Plan / ED Course  I have reviewed the triage vital signs and the nursing notes.  Pertinent labs & imaging results that were available during my care of the patient were reviewed by me and considered in my medical decision making (see chart for details).     Patient presenting with sudden onset of abdominal pain. He has multiple medical problems including peritoneal mesothelioma and prior malignant small bowel obstruction. Patient also gets piercing T cyst every other week. Concern for potential obstruction today versus mesenteric ischemia versus perforation versus SBP. Patient denies any infectious symptoms but appears extremely uncomfortable. Vital signs are stable. Patient given Zofran and Dilaudid. CBC, CMP, lipase, lactate, CT of the abdomen and pelvis pending  9:04 PM Labs without acute significant change. On reevaluation pain is improved after Dilaudid but still 6 out of 10. Having some vomiting when attempting to drink contrast.  10:58 PM Patient's CT shows diffuse peritoneal carcinomatosis with large volume ascites. He has wall thickening of small bowel concerning for ileitis and also concern for wall thickening of the distal esophagus. On reevaluation patient's pain is better however anytime he attempts to drink he develops vomiting. Patient also states his pain medication at home has not been adequately controlling his pain. Will admit for ongoing IV fluids and pain control.  Final Clinical Impressions(s) / ED Diagnoses   Final diagnoses:  Generalized abdominal pain  Non-intractable vomiting with nausea, unspecified vomiting type  Peritoneal carcinomatosis [facility])    New Prescriptions New Prescriptions   No medications on file     Blanchie Dessert, MD 05/14/17 2300

## 2017-05-14 NOTE — H&P (Signed)
History and Physical    Frank Tucker OEV:[id number] DOB: 06-29-1961 DOA: 05/14/2017  PCP: Dr. Benay Spice Patient coming from: home  Chief Complaint: Abdominal pain  HPI: Frank Tucker is a 56 y.o. 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  Frank Tucker has been under the care of Oncology.  His last chemotherapy was in April.  He has malignant ascites for which he has had paracentesis on 04/20/17 and 05/10/17 most recently.  He reports that today, a few hours before arrival to the ED, he developed acute severe abdominal pain which was diffuse.  He has chronic pain at baseline, but this was much worse than he has had before.  He also developed vomiting and nausea.  He has had limited ability to take in PO, but has been trying to eat.  He notes that his oral pain medications were not working well anymore and he became nervous when the pain got very bad.  By the time I saw him, his pain was resolved and he was at his baseline for pain.  He notes no hematemesis, no fever, no chills, no chest pain.  He had a BM this morning which was normal consistency for him.  He does report some mild lightheadedness today, decreased exercise tolerance and weight loss.  He was started on TPN about 2 days ago.  He notes that his abdominal swelling is about the same as normal for him, but he does feel like his swelling has gotten worse faster because of the recent start of TPN (2L volume started today).   ED Course: In the ED, he was treated with Zofran and dilaudid 2mg  total with resolution of his pain and vomiting.  He had a CT scan of the abdomen which showed some ileitis, but no obstruction.  He is nervous to return home for fear the pain will recur.    Review of Systems: As per HPI otherwise 10 point review of systems negative.   Past Medical History:  Diagnosis Date  . Ascites   . Mesothelioma of  peritoneum ([place]) 01/08/2017  . Peritoneal carcinomatosis [facility])     Past Surgical History:  Procedure Laterality Date  . ESOPHAGOGASTRODUODENOSCOPY (EGD) WITH PROPOFOL N/A 12/29/2016   Procedure: ESOPHAGOGASTRODUODENOSCOPY (EGD) WITH PROPOFOL;  Surgeon: Doran Stabler, MD;  Location: [facility] ENDOSCOPY;  Service: Gastroenterology;  Laterality: N/A;  . IR GENERIC HISTORICAL  01/20/2017   IR FLUORO GUIDE PORT INSERTION RIGHT 01/20/2017 Sandi Mariscal, MD [facility]-INTER[facility] RAD  . IR GENERIC HISTORICAL  01/20/2017   IR US GUIDE VASC ACCESS RIGHT 01/20/2017 Sandi Mariscal, MD [facility]  . IR PARACENTESIS  05/10/2017  . PARACENTESIS  12/25/2016   Reviewed.   reports that he has never smoked. He has never used smokeless tobacco. He reports that he does not drink alcohol or use drugs.  No Known Allergies  Reviewed with patient.  Family History  Problem Relation Age of Onset  . Brain cancer Mother   . CAD Father   . Alzheimer's disease Father     Prior to Admission medications   Medication Sig Start Date End Date Taking? Authorizing Provider  ADULT TPN Inject into the vein continuous. Nutritional TPN, [facility] in [place], Vit 1 & 2 infused in 2000 ml bag, goes continuously for 24 hours   Yes [provider]  bisacodyl (DULCOLAX) 10 MG suppository Place 1 suppository (10 mg total) rectally every 12 (twelve)  hours as needed for mild constipation or moderate constipation. 04/08/17  Yes Barton Dubois, MD  dexamethasone (DECADRON) 4 MG tablet Take 1 tablet (4 mg total) by mouth 2 (two) times daily. For 3 days. Begin day after chemo. 03/22/17  Yes Ladell Pier, MD  feeding supplement (BOOST HIGH PROTEIN) LIQD Take 237 mLs by mouth 3 (three) times daily between meals. 04/08/17  Yes Barton Dubois, MD  LORazepam (ATIVAN) 1 MG tablet Take 1 tablet (1 mg total) by mouth every 6 (six) hours as needed (nausea). DO NOT DRIVE 5/62/13  Yes Ladell Pier, MD  magnesium oxide (MAG-OX) 400 MG tablet Take 1 tablet  (400 mg total) by mouth 2 (two) times daily. 04/26/17  Yes Ladell Pier, MD  nystatin (MYCOSTATIN/NYSTOP) powder Apply topically 2 (two) times daily. To under arm rash 03/04/17  Yes Ladell Pier, MD  ondansetron (ZOFRAN) 8 MG tablet Take 1 tablet (8 mg total) by mouth every 8 (eight) hours as needed for nausea or vomiting. Begin 3 days after chemo. 05/11/17  Yes Ladell Pier, MD  oxyCODONE-acetaminophen (PERCOCET/ROXICET) 5-325 MG tablet Take 1 tablet by mouth every 4 (four) hours as needed for severe pain. 05/07/17  Yes Owens Shark, NP  pantoprazole (PROTONIX) 40 MG tablet Take 1 tablet (40 mg total) by mouth 2 (two) times daily. 04/08/17  Yes Barton Dubois, MD  polyethylene glycol [facility] / GLYCOLAX) packet Take 17 g by mouth daily. 04/09/17  Yes Barton Dubois, MD                                Physical Exam: Vitals:   05/14/17 2035 05/14/17 2122 05/14/17 2212 05/14/17 2305  BP: 111/79 111/79 125/89 114/82  Pulse: (!) 103 95 (!) 104 96  Resp: 18 18 16 20   Temp:      TempSrc:      SpO2: 95% 95% 97% 96%  Weight:      Height:        Constitutional: Thin gentleman, lying in bed, no distress, appears older than stated age Vitals:   05/14/17 2035 05/14/17 2122 05/14/17 2212 05/14/17 2305  BP: 111/79 111/79 125/89 114/82  Pulse: (!) 103 95 (!) 104 96  Resp: 18 18 16 20   Temp:      TempSrc:      SpO2: 95% 95% 97% 96%  Weight:      Height:       Eyes: Pupils minimally reactive to light, lids and conjunctivae normal ENMT: Mucous membranes are dry with tongue fissuring. Posterior pharynx clear of any exudate or lesions.  Rounded face Neck: normal, supple, no masses, no thyromegaly Chest: Portacath in the right upper chest Respiratory: clear to auscultation bilaterally in the bases, no wheezing, no crackles. Normal respiratory effort.  Cardiovascular: Normal rate and regular rhythm, no murmurs / rubs / gallops. + pitting edema to mid calves, worse at the ankles Abdomen:  Distended, non tender to palpation, decreased bowel sounds, but present.   Musculoskeletal: no clubbing / cyanosis. Muscle wasting in arms, hands Skin: no apparent rash on visible skin.  Neurologic: grossly intact, moving all extremities.  Psychiatric: Normal judgment and insight. Alert and oriented x 3. Normal mood.    Labs on Admission: I have personally reviewed following labs and imaging studies  CBC:  Recent Labs Lab 05/11/17 1014 05/14/17 1848  WBC 11.5* 12.5*  NEUTROABS 9.1*  --   HGB 8.5* 9.6*  HCT 26.8* 29.4*  MCV 101.9* 98.7  PLT 619* 443*   Basic Metabolic Panel:  Recent Labs Lab 05/11/17 1014 05/14/17 1848  NA 128* 128*  K 5.1 4.6  CL  --  94*  CO2 26 22  GLUCOSE 92 119*  BUN 26.4* 21*  CREATININE 2.0* 1.73*  CALCIUM 8.4 8.4*  MG 1.5  --    GFR: Estimated Creatinine Clearance: 54.8 mL/min (A) (by C-G formula based on SCr of 1.73 mg/dL (H)). Liver Function Tests:  Recent Labs Lab 05/11/17 1014 05/14/17 1848  AST 15 17  ALT 7 10*  ALKPHOS 130 114  BILITOT <0.22 0.3  PROT 5.1* 5.9*  ALBUMIN 1.3* 1.6*    Recent Labs Lab 05/14/17 1848  LIPASE 31   No results for input(s): AMMONIA in the last 168 hours. Coagulation Profile: No results for input(s): INR, PROTIME in the last 168 hours. Cardiac Enzymes: No results for input(s): CKTOTAL, CKMB, CKMBINDEX, TROPONINI in the last 168 hours. BNP (last 3 results) No results for input(s): PROBNP in the last 8760 hours. HbA1C: No results for input(s): HGBA1C in the last 72 hours. CBG: No results for input(s): GLUCAP in the last 168 hours. Lipid Profile: No results for input(s): CHOL, HDL, LDLCALC, TRIG, CHOLHDL, LDLDIRECT in the last 72 hours. Thyroid Function Tests: No results for input(s): TSH, T4TOTAL, FREET4, T3FREE, THYROIDAB in the last 72 hours. Anemia Panel: No results for input(s): VITAMINB12, FOLATE, FERRITIN, TIBC, IRON, RETICCTPCT in the last 72 hours. Urine analysis:    Component  Value Date/Time   COLORURINE YELLOW 05/14/2017 2024   APPEARANCEUR CLEAR 05/14/2017 2024   LABSPEC 1.028 05/14/2017 2024   PHURINE 5.0 05/14/2017 2024   GLUCOSEU NEGATIVE 05/14/2017 2024   HGBUR NEGATIVE 05/14/2017 2024   [place] NEGATIVE 05/14/2017 2024   [place] NEGATIVE 05/14/2017 2024   PROTEINUR NEGATIVE 05/14/2017 2024   NITRITE NEGATIVE 05/14/2017 2024   LEUKOCYTESUR NEGATIVE 05/14/2017 2024    Radiological Exams on Admission: Ct Abdomen Pelvis Wo Contrast  Result Date: 05/14/2017 CLINICAL DATA:  Acute onset of severe generalized abdominal pain, nausea and vomiting. Initial encounter. EXAM: CT ABDOMEN AND PELVIS WITHOUT CONTRAST TECHNIQUE: Multidetector CT imaging of the abdomen and pelvis was performed following the standard protocol without IV contrast. COMPARISON:  CT of the abdomen and pelvis performed 03/31/2017 and 12/16/2016 FINDINGS: Lower chest: Trace bilateral pleural effusions are noted, left greater than right, with associated atelectasis. The visualized portions of the mediastinum are grossly unremarkable. There is apparent wall thickening at the distal esophagus, better characterized on coronal images. An underlying mass cannot be excluded. Endoscopy could be considered for further evaluation. Hepatobiliary: The liver is unremarkable in appearance. The gallbladder is unremarkable in appearance. The common bile duct remains normal in caliber. Pancreas: The pancreas is within normal limits. Spleen: The spleen is unremarkable in appearance. Adrenals/Urinary Tract: The adrenal glands are unremarkable in appearance. Nonspecific perinephric stranding is noted bilaterally. The kidneys are otherwise unremarkable in appearance. There is no evidence of hydronephrosis. No renal or ureteral stones are identified. Stomach/Bowel: The stomach is unremarkable in appearance. There is wall thickening along the visualized small bowel, raising question for infectious or inflammatory ileitis.  The appendix is normal in caliber, without evidence of appendicitis. The patient's peritoneal carcinomatosis abuts the splenic flexure of the colon. The colon is otherwise unremarkable in appearance. Vascular/Lymphatic: The abdominal aorta is unremarkable in appearance. The inferior vena cava is grossly unremarkable. No retroperitoneal lymphadenopathy is seen. No pelvic sidewall lymphadenopathy is identified. Reproductive:  The bladder is mildly distended and grossly unremarkable. The prostate remains borderline normal in size. Other: Diffuse peritoneal carcinomatosis is noted within the abdomen and pelvis. Large volume ascites is noted throughout the abdomen and pelvis. Musculoskeletal: No acute osseous abnormalities are identified. The visualized musculature is unremarkable in appearance. IMPRESSION: 1. Diffuse peritoneal carcinomatosis within the abdomen and pelvis. 2. Large volume ascites noted throughout the abdomen and pelvis. 3. Peritoneal carcinomatosis abuts the splenic flexure of the colon. Colon otherwise unremarkable. 4. Wall thickening along the visualized small bowel, raising question for infectious or inflammatory ileitis. 5. Trace bilateral pleural effusions, left greater than right, with associated atelectasis. 6. Apparent wall thickening at the distal esophagus, better characterized on coronal images. This appears to be new from December. Underlying mass cannot be excluded. Endoscopy could be considered for further evaluation. Electronically Signed   By: Garald Balding M.D.   On: 05/14/2017 22:37     Assessment/Plan Severe abdominal pain due to acute enteritis, abdominal distention - Improved with dilaudid, oral medications not adequate - Currently hemodynamically stable and appears much improved compared to presentation to the ED.  - Possibly related to worsening of ascites, though patient feels his swelling is about at baseline - Consider IR or bedside therapeutic paracentesis tomorrow -  Dilaudid 1mg  q3 hours PRN pain - Contact Oncology for further recommendations in the AM - I considered SBP, however, his only symptom was abdominal pain which improved with dilaudid alone.  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 - Consider paracentesis in the AM - Hold TPN for now, increased volume could be playing a role in his worsening ascites - Discuss with oncology in the morning    Nausea with vomiting - Zofran PRN - NPO    Hyponatremia, chronic, mild - Chronic since April, likely related to low solute intake - Trend, monitor for acute worsening  Malnutrition, severe - On TPN at home - Muscle wasting, very low albumin, swelling and ascites - Hold TPN for now given above - NPO  Elevated Cr - Chronically elevated since April, likely related to above conditions - possibly treatment with cisplatin previously - Trend, currently at baseline - Renal ultrasound earlier this month without hydronephrosis (5/21)  Leukocytosis - Previously neutropenic, now with leukocytosis for 1 month - Unclear cause, possibly acute reaction to current malignancy.  He reports no fever, chills.  He has no symptoms beyond abdominal pain currently to explain persistent leukocytosis - Differential has revealed persistent neutrophil elevation since May 1 - Trend, monitor.       DVT prophylaxis: Lovenox Code Status: Full Disposition Plan: If continues to do well, discharge tomorrow Consults called: None, needs oncology in the AM Admission status: Inpatient, Buren Kos MD [facility] Pager [phone]  If 7PM-7AM, please contact night-coverage [email or web address] Password [facility]  05/14/2017, 11:39 PM

## 2017-05-15 ENCOUNTER — Inpatient Hospital Stay (HOSPITAL_COMMUNITY): Payer: PRIVATE HEALTH INSURANCE

## 2017-05-15 DIAGNOSIS — C482 Malignant neoplasm of peritoneum, unspecified: Secondary | ICD-10-CM

## 2017-05-15 DIAGNOSIS — R110 Nausea: Secondary | ICD-10-CM

## 2017-05-15 LAB — BASIC METABOLIC PANEL
Anion gap: 10 (ref 5–15)
BUN: 22 mg/dL — ABNORMAL HIGH (ref 6–20)
CHLORIDE: 94 mmol/L — AB (ref 101–111)
CO2: 24 mmol/L (ref 22–32)
Calcium: 8.5 mg/dL — ABNORMAL LOW (ref 8.9–10.3)
Creatinine, Ser: 1.64 mg/dL — ABNORMAL HIGH (ref 0.61–1.24)
GFR calc non Af Amer: 46 mL/min — ABNORMAL LOW (ref >60)
GFR, EST AFRICAN AMERICAN: 53 mL/min — AB (ref >60)
Glucose, Bld: 121 mg/dL — ABNORMAL HIGH (ref 65–99)
POTASSIUM: 4.7 mmol/L (ref 3.5–5.1)
SODIUM: 128 mmol/L — AB (ref 135–145)

## 2017-05-15 LAB — CBC
HEMATOCRIT: 27.4 % — AB (ref 39.0–52.0)
Hemoglobin: 8.9 g/dL — ABNORMAL LOW (ref 13.0–17.0)
MCH: 32.6 pg (ref 26.0–34.0)
MCHC: 32.5 g/dL (ref 30.0–36.0)
MCV: 100.4 fL — AB (ref 78.0–100.0)
Platelets: 684 10*3/uL — ABNORMAL HIGH (ref 150–400)
RBC: 2.73 MIL/uL — AB (ref 4.22–5.81)
RDW: 15.2 % (ref 11.5–15.5)
WBC: 14.6 10*3/uL — AB (ref 4.0–10.5)

## 2017-05-15 LAB — PROTIME-INR
INR: 0.85
Prothrombin Time: 11.6 seconds (ref 11.4–15.2)

## 2017-05-15 LAB — GLUCOSE, CAPILLARY: GLUCOSE-CAPILLARY: 101 mg/dL — AB (ref 65–99)

## 2017-05-15 MED ORDER — LORAZEPAM 1 MG PO TABS
1.0000 mg | ORAL_TABLET | Freq: Four times a day (QID) | ORAL | Status: DC | PRN
  Administered 2017-05-15 (×2): 1 mg via ORAL
  Filled 2017-05-15 (×2): qty 1

## 2017-05-15 MED ORDER — HYDROMORPHONE HCL 2 MG PO TABS: 2.0000 mg | ORAL_TABLET | ORAL | Status: DC | PRN

## 2017-05-15 MED ORDER — PANTOPRAZOLE SODIUM 40 MG PO TBEC
40.0000 mg | DELAYED_RELEASE_TABLET | Freq: Two times a day (BID) | ORAL | Status: DC
  Administered 2017-05-15 (×2): 40 mg via ORAL
  Filled 2017-05-15 (×2): qty 1

## 2017-05-15 MED ORDER — HYDROMORPHONE HCL 1 MG/ML IJ SOLN
1.0000 mg | INTRAMUSCULAR | Status: DC | PRN
  Administered 2017-05-15 (×4): 1 mg via INTRAVENOUS
  Filled 2017-05-15 (×4): qty 1

## 2017-05-15 MED ORDER — ONDANSETRON HCL 4 MG/2ML IJ SOLN
4.0000 mg | Freq: Four times a day (QID) | INTRAMUSCULAR | Status: DC | PRN
  Administered 2017-05-15 (×2): 4 mg via INTRAVENOUS
  Filled 2017-05-15: qty 2

## 2017-05-15 MED ORDER — POLYETHYLENE GLYCOL 3350 17 G PO PACK
17.0000 g | PACK | Freq: Every day | ORAL | Status: DC
  Administered 2017-05-15: 17 g via ORAL
  Filled 2017-05-15: qty 1

## 2017-05-15 MED ORDER — ENOXAPARIN SODIUM 40 MG/0.4ML ~~LOC~~ SOLN
40.0000 mg | Freq: Every day | SUBCUTANEOUS | Status: DC
  Administered 2017-05-15: 40 mg via SUBCUTANEOUS
  Filled 2017-05-15: qty 0.4

## 2017-05-15 MED ORDER — HYDROMORPHONE HCL 2 MG PO TABS: 2.0000 mg | ORAL_TABLET | ORAL | 0 refills | Status: DC | PRN

## 2017-05-15 MED ORDER — HEPARIN SOD (PORK) LOCK FLUSH 100 UNIT/ML IV SOLN
500.0000 [IU] | Freq: Once | INTRAVENOUS | Status: DC
  Filled 2017-05-15: qty 5

## 2017-05-15 MED ORDER — MAGNESIUM OXIDE 400 (241.3 MG) MG PO TABS
400.0000 mg | ORAL_TABLET | Freq: Two times a day (BID) | ORAL | Status: DC
  Administered 2017-05-15: 400 mg via ORAL
  Filled 2017-05-15 (×2): qty 1

## 2017-05-15 MED ORDER — BISACODYL 10 MG RE SUPP: 10.0000 mg | Freq: Two times a day (BID) | RECTAL | Status: DC | PRN

## 2017-05-15 MED ORDER — ONDANSETRON HCL 4 MG PO TABS: 4.0000 mg | ORAL_TABLET | Freq: Four times a day (QID) | ORAL | Status: DC | PRN

## 2017-05-15 NOTE — Procedures (Signed)
Ultrasound-guided therapeutic paracentesis performed yielding 5.4 liters of serous colored fluid. No immediate complications.  Verdis Bassette E 10:39 AM 05/15/2017

## 2017-05-15 NOTE — Discharge Summary (Signed)
Physician Discharge Summary  Frank Tucker TGG:[id number] DOB: 01/07/1961 DOA: 05/14/2017  PCP: Patient, No Pcp Per  Admit date: 05/14/2017 Discharge date: 05/15/2017  Recommendations for Outpatient Follow-up:  1. Paracentesis done with 5.4 L fluid removed 2. Pt reproted pain is controlled and expressed wish to go home 3. 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  [name] has been under the care of Oncology.  His last chemotherapy was in April.  He has malignant ascites for which he has had paracentesis on 04/20/17 and 05/10/17 most recently.  He reports that today, a few hours before arrival to the ED, he developed acute severe abdominal pain which was diffuse.  He has chronic pain at baseline, but this was much worse than he has had before.  He also developed vomiting and nausea.  He has had limited ability to take in PO, but has been trying to eat.  He notes that his oral pain medications were not working well anymore and he became nervous when the pain got very bad.  By the time I saw him, his pain was resolved and he was at his baseline for pain.  He notes no hematemesis, no fever, no chills, no chest pain.  He had a BM this morning which was normal consistency for him.  He does report some mild lightheadedness today, decreased exercise tolerance and weight loss.  He was started on TPN about 2 days ago.  He notes that his abdominal swelling is about the same as normal for him, but he does feel  like his swelling has gotten worse faster because of the recent start of TPN (2L volume started today). "  Hospital Course:   Active Problems:   Ascites in the setting of peritoneal carcinomatosis (HCC) / Mid abdominal pain / Nausea in adult - Pt has had paracentesis of 5.4 L fluid removed - His abd pain is much better controlled at this point - He is requesting to go home - Continue home meds   Macrocytic anemia / Anemia of CKD - Hgb stable   CKD stage 3 - Baseline Cr 2.3 - Cr on this admission within baseline range   Hyponatremia - Due to ascites, malignancy - Stable     Signed:  Leisa Lenz, MD  [facility] 05/15/2017, 12:34 PM  Pager #: [phone]  Time spent in minutes: less than 30 minutes  Procedures:  Paracentesis - 5.4 L fluid removed   Consultations:  IR  Discharge Exam: Vitals:   05/15/17 1020 05/15/17 1232  BP: 115/74 114/62  Pulse:  84  Resp:  16  Temp:  97 F (36.1 C)   Vitals:   05/15/17 1010 05/15/17 1015 05/15/17 1020 05/15/17 1232  BP: 111/79 111/71 115/74 114/62  Pulse:    84  Resp:    16  Temp:    97 F (36.1 C)  TempSrc:    Oral  SpO2:    99%  Weight:      Height:        General:  Pt is alert, follows commands appropriately, not in acute distress Cardiovascular: Regular rate and rhythm, S1/S2 +, no murmurs Respiratory: Clear to auscultation bilaterally, no wheezing, no crackles, no rhonchi Abdominal: Soft,distended with fluid wave  Extremities: +1 LE edema, no cyanosis, pulses palpable bilaterally DP and PT Neuro: Grossly nonfocal  Discharge Instructions  Discharge Instructions    Call MD for:  persistant nausea and vomiting    Complete by:  As directed    Call MD for:  redness, tenderness, or signs of infection (pain, swelling, redness, odor or green/yellow discharge around incision site)    Complete by:  As directed    Call MD for:  severe uncontrolled pain    Complete by:  As directed    Diet - low sodium  heart healthy    Complete by:  As directed    Increase activity slowly    Complete by:  As directed      Allergies as of 05/15/2017   No Known Allergies     Medication List    STOP taking these medications   lidocaine-prilocaine cream Commonly known as:  EMLA   oxyCODONE-acetaminophen 5-325 MG tablet Commonly known as:  PERCOCET/ROXICET     TAKE these medications   acetaminophen 325 MG tablet Commonly known as:  TYLENOL Take 2 tablets (650 mg total) by mouth every 6 (six) hours as needed for mild pain or headache (or Fever >/= 101).   ADULT TPN Inject into the vein continuous. Nutritional TPN, [facility] in [place], Vit 1 & 2 infused in 2000 ml bag, goes continuously for 24 hours   bisacodyl 10 MG suppository Commonly known as:  DULCOLAX Place 1 suppository (10 mg total) rectally every 12 (twelve) hours as needed for mild constipation or moderate constipation.   dexamethasone 4 MG tablet Commonly known as:  DECADRON Take 1 tablet (4 mg total) by mouth 2 (two) times daily. For 3 days. Begin day after chemo.   feeding supplement Liqd Take 237 mLs by mouth 3 (three) times daily between meals.   folic acid 1 MG tablet Commonly known as:  FOLVITE Take 1 tablet (1 mg total) by mouth daily.   HYDROmorphone 2 MG tablet Commonly known as:  DILAUDID Take 1 tablet (2 mg total) by mouth every 4 (four) hours as needed for severe pain.   LORazepam 1 MG tablet Commonly known as:  ATIVAN Take 1 tablet (1 mg total) by mouth every 6 (six) hours as needed (nausea). DO NOT DRIVE   magnesium oxide 400 MG tablet Commonly known as:  MAG-OX Take 1 tablet (400 mg total) by mouth 2 (two) times daily.   metoCLOPramide 10 MG tablet Commonly known as:  REGLAN Take 1 tablet (10 mg total) by mouth 3 (three) times daily before meals. DO NOT TAKE COMPAZINE.   nystatin powder Commonly known as:  MYCOSTATIN/NYSTOP Apply topically 2 (two) times daily. To under arm rash   ondansetron 8 MG  tablet Commonly known as:  ZOFRAN Take 1 tablet (8 mg total) by mouth every 8 (eight) hours as needed for nausea or vomiting. Begin 3 days after chemo.   pantoprazole 40 MG tablet Commonly known as:  PROTONIX Take 1 tablet (40 mg total) by mouth 2 (two) times daily.   polyethylene glycol packet Commonly known as:  MIRALAX / GLYCOLAX Take 17 g by mouth daily.         The results of significant diagnostics from this hospitalization (including imaging, microbiology, ancillary and laboratory) are  listed below for reference.    Significant Diagnostic Studies: Ct Abdomen Pelvis Wo Contrast  Result Date: 05/14/2017 CLINICAL DATA:  Acute onset of severe generalized abdominal pain, nausea and vomiting. Initial encounter. EXAM: CT ABDOMEN AND PELVIS WITHOUT CONTRAST TECHNIQUE: Multidetector CT imaging of the abdomen and pelvis was performed following the standard protocol without IV contrast. COMPARISON:  CT of the abdomen and pelvis performed 03/31/2017 and 12/16/2016 FINDINGS: Lower chest: Trace bilateral pleural effusions are noted, left greater than right, with associated atelectasis. The visualized portions of the mediastinum are grossly unremarkable. There is apparent wall thickening at the distal esophagus, better characterized on coronal images. An underlying mass cannot be excluded. Endoscopy could be considered for further evaluation. Hepatobiliary: The liver is unremarkable in appearance. The gallbladder is unremarkable in appearance. The common bile duct remains normal in caliber. Pancreas: The pancreas is within normal limits. Spleen: The spleen is unremarkable in appearance. Adrenals/Urinary Tract: The adrenal glands are unremarkable in appearance. Nonspecific perinephric stranding is noted bilaterally. The kidneys are otherwise unremarkable in appearance. There is no evidence of hydronephrosis. No renal or ureteral stones are identified. Stomach/Bowel: The stomach is unremarkable in  appearance. There is wall thickening along the visualized small bowel, raising question for infectious or inflammatory ileitis. The appendix is normal in caliber, without evidence of appendicitis. The patient's peritoneal carcinomatosis abuts the splenic flexure of the colon. The colon is otherwise unremarkable in appearance. Vascular/Lymphatic: The abdominal aorta is unremarkable in appearance. The inferior vena cava is grossly unremarkable. No retroperitoneal lymphadenopathy is seen. No pelvic sidewall lymphadenopathy is identified. Reproductive: The bladder is mildly distended and grossly unremarkable. The prostate remains borderline normal in size. Other: Diffuse peritoneal carcinomatosis is noted within the abdomen and pelvis. Large volume ascites is noted throughout the abdomen and pelvis. Musculoskeletal: No acute osseous abnormalities are identified. The visualized musculature is unremarkable in appearance. IMPRESSION: 1. Diffuse peritoneal carcinomatosis within the abdomen and pelvis. 2. Large volume ascites noted throughout the abdomen and pelvis. 3. Peritoneal carcinomatosis abuts the splenic flexure of the colon. Colon otherwise unremarkable. 4. Wall thickening along the visualized small bowel, raising question for infectious or inflammatory ileitis. 5. Trace bilateral pleural effusions, left greater than right, with associated atelectasis. 6. Apparent wall thickening at the distal esophagus, better characterized on coronal images. This appears to be new from December. Underlying mass cannot be excluded. Endoscopy could be considered for further evaluation. Electronically Signed   By: Garald Balding M.D.   On: 05/14/2017 22:37   US Renal  Result Date: 05/10/2017 CLINICAL DATA:  56 year old male with peritoneal mesothelioma. Elevated creatinine. Subsequent encounter. EXAM: RENAL / URINARY TRACT ULTRASOUND COMPLETE COMPARISON:  03/31/2017 CT. FINDINGS: Right Kidney: Length: 10.5 cm. Echogenicity within  normal limits. No mass or hydronephrosis visualized. Left Kidney: Length: 11 cm. No hydronephrosis. Lower pole 9 mm echogenic focus of indeterminate etiology. Bladder: Appears normal for degree of bladder distention. Ascites.  Slightly lobulated prostate gland. IMPRESSION: No evidence hydronephrosis. 9 mm nonspecific lesion inferior aspect left kidney. No calcification seen on prior CT at this level. Low-density structure noted on prior CT and therefore this may represent a complex cyst. Alternatively this may represent focal indentation of fat. This did not appear hypermetabolic on 40/98/1191 PET-CT. Ascites. Slightly lobulated prostate gland. Electronically Signed   By: Genia Del M.D.   On: 05/10/2017 20:07   US Paracentesis  Result Date: 04/20/2017 INDICATION: Patient with history of primary peritoneal mesothelioma with abdominal carcinomatosis and recurrent malignant ascites. Request made  for therapeutic paracentesis. EXAM: ULTRASOUND GUIDED THERAPEUTIC PARACENTESIS MEDICATIONS: None. COMPLICATIONS: None immediate. PROCEDURE: Informed written consent was obtained from the patient after a discussion of the risks, benefits and alternatives to treatment. A timeout was performed prior to the initiation of the procedure. Initial ultrasound scanning demonstrates a large amount of ascites within the right lower abdominal quadrant. The right lower abdomen was prepped and draped in the usual sterile fashion. 1% lidocaine was used for local anesthesia. Following this, a Yueh catheter was introduced. An ultrasound image was saved for documentation purposes. The paracentesis was performed. The catheter was removed and a dressing was applied. The patient tolerated the procedure well without immediate post procedural complication. FINDINGS: A total of approximately 8.8 liters of gelatinous, yellow fluid was removed. IMPRESSION: Successful ultrasound-guided therapeutic paracentesis yielding 8.8 liters of peritoneal  fluid. Read by: Rowe Robert, PA-C Electronically Signed   By: Markus Daft M.D.   On: 04/20/2017 15:24   Dg Abd 2 Views  Result Date: 05/03/2017 CLINICAL DATA:  Lower abdominal pain and nausea. History of mesothelioma. EXAM: ABDOMEN - 2 VIEW COMPARISON:  None in PACs FINDINGS: There are loops of mildly distended gas and fluid-filled small bowel in the midline and left mid upper abdomen. No free extraluminal gas collections are observed. No significant large bowel gas is demonstrated. There is no rectal gas. The bony structures exhibit no acute abnormalities. There is linear atelectasis or scarring at the lung bases. IMPRESSION: Findings compatible with a mid to distal small bowel ileus or partial small bowel obstruction. There is no evidence of perforation. Electronically Signed   By: David  Martinique M.D.   On: 05/03/2017 12:32   Ir Paracentesis  Result Date: 05/10/2017 INDICATION: Recurrent ascites.  Request is made for therapeutic paracentesis. EXAM: ULTRASOUND GUIDED THERAPEUTIC PARACENTESIS MEDICATIONS: 10 mL 1% lidocaine COMPLICATIONS: None immediate. PROCEDURE: Informed written consent was obtained from the patient after a discussion of the risks, benefits and alternatives to treatment. A timeout was performed prior to the initiation of the procedure. Initial ultrasound scanning demonstrates a large amount of ascites within the right lateral abdomen. The right lateral abdomen was prepped and draped in the usual sterile fashion. 1% lidocaine was used for local anesthesia. Following this, a 19 gauge, 7-cm, Yueh catheter was introduced. An ultrasound image was saved for documentation purposes. The paracentesis was performed. The catheter was removed and a dressing was applied. The patient tolerated the procedure well without immediate post procedural complication. FINDINGS: A total of approximately 4.0 liters of clear, yellow fluid was removed. IMPRESSION: Successful ultrasound-guided therapeutic  paracentesis yielding 4.0 liters of peritoneal fluid. Read by:  Brynda Greathouse PA-C Electronically Signed   By: Markus Daft M.D.   On: 05/10/2017 15:28    Microbiology: No results found for this or any previous visit (from the past 240 hour(s)).   Labs: Basic Metabolic Panel:  Recent Labs Lab 05/11/17 1014 05/14/17 1848 05/15/17 0542  NA 128* 128* 128*  K 5.1 4.6 4.7  CL  --  94* 94*  CO2 26 22 24   GLUCOSE 92 119* 121*  BUN 26.4* 21* 22*  CREATININE 2.0* 1.73* 1.64*  CALCIUM 8.4 8.4* 8.5*  MG 1.5  --   --    Liver Function Tests:  Recent Labs Lab 05/11/17 1014 05/14/17 1848  AST 15 17  ALT 7 10*  ALKPHOS 130 114  BILITOT <0.22 0.3  PROT 5.1* 5.9*  ALBUMIN 1.3* 1.6*    Recent Labs Lab 05/14/17 1848  LIPASE  31   No results for input(s): AMMONIA in the last 168 hours. CBC:  Recent Labs Lab 05/11/17 1014 05/14/17 1848 05/15/17 0542  WBC 11.5* 12.5* 14.6*  NEUTROABS 9.1*  --   --   HGB 8.5* 9.6* 8.9*  HCT 26.8* 29.4* 27.4*  MCV 101.9* 98.7 100.4*  PLT 619* 714* 684*   Cardiac Enzymes: No results for input(s): CKTOTAL, CKMB, CKMBINDEX, TROPONINI in the last 168 hours. BNP: BNP (last 3 results)  Recent Labs  12/16/16 1209  BNP 8.5    ProBNP (last 3 results) No results for input(s): PROBNP in the last 8760 hours.  CBG:  Recent Labs Lab 05/15/17 0750  GLUCAP 101*

## 2017-05-15 NOTE — Discharge Instructions (Signed)
Ascites  Ascites is a collection of excess fluid in the abdomen. Ascites can range from mild to severe. It can get worse without treatment.  What are the causes?  Possible causes include:  · Cirrhosis. This is the most common cause of ascites.  · Infection or inflammation in the abdomen.  · Cancer in the abdomen.  · Heart failure.  · Kidney disease.  · Inflammation of the pancreas.  · Clots in the veins of the liver.    What are the signs or symptoms?  Signs and symptoms may include:  · A feeling of fullness in your abdomen. This is common.  · An increase in the size of your abdomen or your waist.  · Swelling in your legs.  · Swelling of the scrotum in men.  · Difficulty breathing.  · Abdominal pain.  · Sudden weight gain.    If the condition is mild, you may not have symptoms.  How is this diagnosed?  To make a diagnosis, your health care provider will:  · Ask about your medical history.  · Perform a physical exam.  · Order imaging tests, such as an ultrasound or CT scan of your abdomen.    How is this treated?  Treatment depends on the cause of the ascites. It may include:  · Taking a pill to make you urinate. This is called a water pill (diuretic pill).  · Strictly reducing your salt (sodium) intake. Salt can cause extra fluid to be kept in the body, and this makes ascites worse.  · Having a procedure to remove fluid from your abdomen (paracentesis).  · Having a procedure to transfer fluid from your abdomen into a vein.  · Having a procedure that connects two of the major veins within your liver and relieves pressure on your liver (TIPS procedure).    Ascites may go away or improve with treatment of the condition that caused it.  Follow these instructions at home:  · Keep track of your weight. To do this, weigh yourself at the same time every day and record your weight.  · Keep track of how much you drink and any changes in the amount you urinate.  · Follow any instructions that your health care provider gives  you about how much to drink.  · Try not to eat salty (high-sodium) foods.  · Take medicines only as directed by your health care provider.  · Keep all follow-up visits as directed by your health care provider. This is important.  · Report any changes in your health to your health care provider, especially if you develop new symptoms or your symptoms get worse.  Contact a health care provider if:  · Your gain more than 3 pounds in 3 days.  · Your abdominal size or your waist size increases.  · You have new swelling in your legs.  · The swelling in your legs gets worse.  Get help right away if:  · You develop a fever.  · You develop confusion.  · You develop new or worsening difficulty breathing.  · You develop new or worsening abdominal pain.  · You develop new or worsening swelling in the scrotum (in men).  This information is not intended to replace advice given to you by your health care provider. Make sure you discuss any questions you have with your health care provider.  Document Released: 12/07/2005 Document Revised: 04/15/2016 Document Reviewed: 07/06/2014  [employer]vier Interactive Patient Education © 2017 [employer]

## 2017-05-16 ENCOUNTER — Inpatient Hospital Stay (HOSPITAL_COMMUNITY)
Admission: EM | Admit: 2017-05-16 | Discharge: 2017-05-20 | DRG: 388 | Disposition: A | Discharge status: Home / Self Care | Payer: PRIVATE HEALTH INSURANCE | Attending: Internal Medicine | Admitting: Internal Medicine

## 2017-05-16 ENCOUNTER — Emergency Department (HOSPITAL_COMMUNITY): Payer: PRIVATE HEALTH INSURANCE

## 2017-05-16 ENCOUNTER — Encounter (HOSPITAL_COMMUNITY): Payer: Self-pay | Admitting: *Deleted

## 2017-05-16 DIAGNOSIS — D701 Agranulocytosis secondary to cancer chemotherapy: Secondary | ICD-10-CM | POA: Diagnosis present

## 2017-05-16 DIAGNOSIS — Z792 Long term (current) use of antibiotics: Secondary | ICD-10-CM | POA: Diagnosis not present

## 2017-05-16 DIAGNOSIS — T451X5A Adverse effect of antineoplastic and immunosuppressive drugs, initial encounter: Secondary | ICD-10-CM | POA: Diagnosis not present

## 2017-05-16 DIAGNOSIS — R112 Nausea with vomiting, unspecified: Secondary | ICD-10-CM | POA: Diagnosis present

## 2017-05-16 DIAGNOSIS — R109 Unspecified abdominal pain: Secondary | ICD-10-CM | POA: Diagnosis not present

## 2017-05-16 DIAGNOSIS — C801 Malignant (primary) neoplasm, unspecified: Secondary | ICD-10-CM | POA: Diagnosis not present

## 2017-05-16 DIAGNOSIS — D6481 Anemia due to antineoplastic chemotherapy: Secondary | ICD-10-CM | POA: Diagnosis present

## 2017-05-16 DIAGNOSIS — R188 Other ascites: Secondary | ICD-10-CM | POA: Diagnosis not present

## 2017-05-16 DIAGNOSIS — G43A0 Cyclical vomiting, not intractable: Secondary | ICD-10-CM | POA: Diagnosis not present

## 2017-05-16 DIAGNOSIS — Z515 Encounter for palliative care: Secondary | ICD-10-CM | POA: Diagnosis not present

## 2017-05-16 DIAGNOSIS — Z85028 Personal history of other malignant neoplasm of stomach: Secondary | ICD-10-CM

## 2017-05-16 DIAGNOSIS — E440 Moderate protein-calorie malnutrition: Secondary | ICD-10-CM | POA: Insufficient documentation

## 2017-05-16 DIAGNOSIS — R1030 Lower abdominal pain, unspecified: Secondary | ICD-10-CM

## 2017-05-16 DIAGNOSIS — K56600 Partial intestinal obstruction, unspecified as to cause: Secondary | ICD-10-CM | POA: Diagnosis present

## 2017-05-16 DIAGNOSIS — Z66 Do not resuscitate: Secondary | ICD-10-CM | POA: Diagnosis present

## 2017-05-16 DIAGNOSIS — E871 Hypo-osmolality and hyponatremia: Secondary | ICD-10-CM | POA: Diagnosis present

## 2017-05-16 DIAGNOSIS — Z95828 Presence of other vascular implants and grafts: Secondary | ICD-10-CM

## 2017-05-16 DIAGNOSIS — R1084 Generalized abdominal pain: Secondary | ICD-10-CM

## 2017-05-16 DIAGNOSIS — C451 Mesothelioma of peritoneum: Secondary | ICD-10-CM | POA: Diagnosis present

## 2017-05-16 DIAGNOSIS — K56609 Unspecified intestinal obstruction, unspecified as to partial versus complete obstruction: Secondary | ICD-10-CM

## 2017-05-16 DIAGNOSIS — Z7189 Other specified counseling: Secondary | ICD-10-CM

## 2017-05-16 DIAGNOSIS — Z79899 Other long term (current) drug therapy: Secondary | ICD-10-CM | POA: Diagnosis not present

## 2017-05-16 DIAGNOSIS — N183 Chronic kidney disease, stage 3 (moderate): Secondary | ICD-10-CM | POA: Diagnosis present

## 2017-05-16 DIAGNOSIS — E43 Unspecified severe protein-calorie malnutrition: Secondary | ICD-10-CM | POA: Diagnosis present

## 2017-05-16 DIAGNOSIS — C786 Secondary malignant neoplasm of retroperitoneum and peritoneum: Secondary | ICD-10-CM | POA: Diagnosis present

## 2017-05-16 DIAGNOSIS — E860 Dehydration: Secondary | ICD-10-CM | POA: Diagnosis present

## 2017-05-16 DIAGNOSIS — D638 Anemia in other chronic diseases classified elsewhere: Secondary | ICD-10-CM | POA: Diagnosis not present

## 2017-05-16 LAB — URINALYSIS, ROUTINE W REFLEX MICROSCOPIC
BACTERIA UA: NONE SEEN
BILIRUBIN URINE: NEGATIVE
GLUCOSE, UA: NEGATIVE mg/dL
HGB URINE DIPSTICK: NEGATIVE
KETONES UR: NEGATIVE mg/dL
LEUKOCYTES UA: NEGATIVE
Nitrite: NEGATIVE
PROTEIN: 30 mg/dL — AB
Specific Gravity, Urine: 1.03 (ref 1.005–1.030)
pH: 5 (ref 5.0–8.0)

## 2017-05-16 LAB — LIPASE, BLOOD: LIPASE: 26 U/L (ref 11–51)

## 2017-05-16 LAB — CBC
HEMATOCRIT: 27.5 % — AB (ref 39.0–52.0)
HEMOGLOBIN: 8.9 g/dL — AB (ref 13.0–17.0)
MCH: 32 pg (ref 26.0–34.0)
MCHC: 32.4 g/dL (ref 30.0–36.0)
MCV: 98.9 fL (ref 78.0–100.0)
PLATELETS: 587 10*3/uL — AB (ref 150–400)
RBC: 2.78 MIL/uL — ABNORMAL LOW (ref 4.22–5.81)
RDW: 14.8 % (ref 11.5–15.5)
WBC: 17.9 10*3/uL — AB (ref 4.0–10.5)

## 2017-05-16 LAB — COMPREHENSIVE METABOLIC PANEL
ALT: 10 U/L — AB (ref 17–63)
AST: 34 U/L (ref 15–41)
Albumin: 1.5 g/dL — ABNORMAL LOW (ref 3.5–5.0)
Alkaline Phosphatase: 100 U/L (ref 38–126)
Anion gap: 12 (ref 5–15)
BUN: 18 mg/dL (ref 6–20)
CHLORIDE: 93 mmol/L — AB (ref 101–111)
CO2: 22 mmol/L (ref 22–32)
CREATININE: 1.49 mg/dL — AB (ref 0.61–1.24)
Calcium: 8.1 mg/dL — ABNORMAL LOW (ref 8.9–10.3)
GFR calc non Af Amer: 52 mL/min — ABNORMAL LOW (ref >60)
GFR, EST AFRICAN AMERICAN: 60 mL/min — AB (ref >60)
Glucose, Bld: 126 mg/dL — ABNORMAL HIGH (ref 65–99)
POTASSIUM: 4.9 mmol/L (ref 3.5–5.1)
SODIUM: 127 mmol/L — AB (ref 135–145)
Total Bilirubin: 0.7 mg/dL (ref 0.3–1.2)
Total Protein: 5.7 g/dL — ABNORMAL LOW (ref 6.5–8.1)

## 2017-05-16 LAB — GLUCOSE, CAPILLARY
Glucose-Capillary: 136 mg/dL — ABNORMAL HIGH (ref 65–99)
Glucose-Capillary: 128 mg/dL — ABNORMAL HIGH (ref 65–99)

## 2017-05-16 LAB — PHOSPHORUS: PHOSPHORUS: 3.5 mg/dL (ref 2.5–4.6)

## 2017-05-16 LAB — MAGNESIUM: Magnesium: 1.6 mg/dL — ABNORMAL LOW (ref 1.7–2.4)

## 2017-05-16 MED ORDER — ONDANSETRON 8 MG PO TBDP
ORAL_TABLET | ORAL | Status: AC
  Administered 2017-05-16: 4 mg via ORAL
  Filled 2017-05-16: qty 1

## 2017-05-16 MED ORDER — FOLIC ACID 1 MG PO TABS: 1.0000 mg | ORAL_TABLET | Freq: Every day | ORAL | Status: DC

## 2017-05-16 MED ORDER — LORAZEPAM 2 MG/ML IJ SOLN
0.5000 mg | Freq: Once | INTRAMUSCULAR | Status: AC
  Administered 2017-05-16: 0.5 mg via INTRAVENOUS
  Filled 2017-05-16: qty 1

## 2017-05-16 MED ORDER — NYSTATIN 100000 UNIT/GM EX POWD
Freq: Two times a day (BID) | CUTANEOUS | Status: DC
  Administered 2017-05-17 – 2017-05-19 (×3): via TOPICAL
  Filled 2017-05-16 (×2): qty 15

## 2017-05-16 MED ORDER — INSULIN ASPART 100 UNIT/ML ~~LOC~~ SOLN
0.0000 [IU] | SUBCUTANEOUS | Status: DC
  Administered 2017-05-16 – 2017-05-19 (×6): 1 [IU] via SUBCUTANEOUS
  Administered 2017-05-19: 2 [IU] via SUBCUTANEOUS
  Administered 2017-05-20: 1 [IU] via SUBCUTANEOUS
  Administered 2017-05-20: 2 [IU] via SUBCUTANEOUS

## 2017-05-16 MED ORDER — HYDROMORPHONE HCL 1 MG/ML IJ SOLN
1.0000 mg | Freq: Once | INTRAMUSCULAR | Status: AC
  Administered 2017-05-16: 1 mg via INTRAVENOUS
  Filled 2017-05-16: qty 1

## 2017-05-16 MED ORDER — PANTOPRAZOLE SODIUM 40 MG PO TBEC
40.0000 mg | DELAYED_RELEASE_TABLET | Freq: Two times a day (BID) | ORAL | Status: DC
  Administered 2017-05-16 – 2017-05-20 (×8): 40 mg via ORAL
  Filled 2017-05-16 (×8): qty 1

## 2017-05-16 MED ORDER — FAT EMULSION 20 % IV EMUL
240.0000 mL | INTRAVENOUS | Status: AC
  Administered 2017-05-16: 240 mL via INTRAVENOUS
  Filled 2017-05-16: qty 250

## 2017-05-16 MED ORDER — MORPHINE SULFATE ER 30 MG PO TBCR
60.0000 mg | EXTENDED_RELEASE_TABLET | Freq: Two times a day (BID) | ORAL | Status: DC
  Administered 2017-05-16: 60 mg via ORAL
  Filled 2017-05-16: qty 2

## 2017-05-16 MED ORDER — IOPAMIDOL (ISOVUE-300) INJECTION 61%
INTRAVENOUS | Status: AC
  Administered 2017-05-16: 100 mL
  Filled 2017-05-16: qty 100

## 2017-05-16 MED ORDER — PROMETHAZINE HCL 25 MG/ML IJ SOLN
25.0000 mg | Freq: Four times a day (QID) | INTRAMUSCULAR | Status: DC | PRN
  Administered 2017-05-16: 25 mg via INTRAVENOUS
  Filled 2017-05-16 (×2): qty 1

## 2017-05-16 MED ORDER — BISACODYL 10 MG RE SUPP: 10.0000 mg | Freq: Two times a day (BID) | RECTAL | Status: DC | PRN

## 2017-05-16 MED ORDER — SODIUM CHLORIDE 0.9% FLUSH
10.0000 mL | INTRAVENOUS | Status: DC | PRN
Start: 2017-05-16 — End: 2017-05-20

## 2017-05-16 MED ORDER — HEPARIN SODIUM (PORCINE) 5000 UNIT/ML IJ SOLN
5000.0000 [IU] | Freq: Three times a day (TID) | INTRAMUSCULAR | Status: DC
  Administered 2017-05-16 – 2017-05-20 (×9): 5000 [IU] via SUBCUTANEOUS
  Filled 2017-05-16 (×9): qty 1

## 2017-05-16 MED ORDER — DEXTROSE 5 % IV SOLN
2.0000 g | INTRAVENOUS | Status: DC
  Administered 2017-05-16 – 2017-05-19 (×4): 2 g via INTRAVENOUS
  Filled 2017-05-16 (×5): qty 2

## 2017-05-16 MED ORDER — M.V.I. ADULT IV INJ
INTRAVENOUS | Status: AC
  Administered 2017-05-16: 19:00:00 via INTRAVENOUS
  Filled 2017-05-16: qty 1800

## 2017-05-16 MED ORDER — M.V.I. ADULT IV INJ
INTRAVENOUS | Status: DC
  Filled 2017-05-16: qty 1800

## 2017-05-16 MED ORDER — SODIUM CHLORIDE 0.9 % IV SOLN: INTRAVENOUS | Status: AC

## 2017-05-16 MED ORDER — DEXTROSE 5 % IV SOLN: 1.0000 g | INTRAVENOUS | Status: DC

## 2017-05-16 MED ORDER — LORAZEPAM 1 MG PO TABS: 1.0000 mg | ORAL_TABLET | Freq: Four times a day (QID) | ORAL | Status: DC | PRN

## 2017-05-16 MED ORDER — FAT EMULSION 20 % IV EMUL
240.0000 mL | INTRAVENOUS | Status: DC
  Filled 2017-05-16: qty 250

## 2017-05-16 MED ORDER — DEXAMETHASONE 4 MG PO TABS: 4.0000 mg | ORAL_TABLET | Freq: Two times a day (BID) | ORAL | Status: DC

## 2017-05-16 MED ORDER — ACETAMINOPHEN 325 MG PO TABS
650.0000 mg | ORAL_TABLET | Freq: Four times a day (QID) | ORAL | Status: DC | PRN
  Filled 2017-05-16: qty 2

## 2017-05-16 MED ORDER — ACETAMINOPHEN 325 MG PO TABS: 650.0000 mg | ORAL_TABLET | Freq: Four times a day (QID) | ORAL | Status: DC | PRN

## 2017-05-16 MED ORDER — ALBUTEROL SULFATE (2.5 MG/3ML) 0.083% IN NEBU: 2.5000 mg | INHALATION_SOLUTION | RESPIRATORY_TRACT | Status: DC | PRN

## 2017-05-16 MED ORDER — HYDROMORPHONE HCL 1 MG/ML IJ SOLN
1.0000 mg | INTRAMUSCULAR | Status: DC | PRN
  Administered 2017-05-17 (×3): 1 mg via INTRAVENOUS
  Filled 2017-05-16 (×3): qty 1

## 2017-05-16 MED ORDER — SODIUM CHLORIDE 0.9 % IV SOLN: INTRAVENOUS | Status: DC

## 2017-05-16 MED ORDER — ONDANSETRON HCL 4 MG/2ML IJ SOLN
4.0000 mg | Freq: Four times a day (QID) | INTRAMUSCULAR | Status: DC | PRN
  Administered 2017-05-16 – 2017-05-19 (×5): 4 mg via INTRAVENOUS
  Filled 2017-05-16 (×5): qty 2

## 2017-05-16 MED ORDER — DOCUSATE SODIUM 100 MG PO CAPS
200.0000 mg | ORAL_CAPSULE | Freq: Two times a day (BID) | ORAL | Status: DC
  Administered 2017-05-16 – 2017-05-20 (×8): 200 mg via ORAL
  Filled 2017-05-16 (×9): qty 2

## 2017-05-16 MED ORDER — MAGNESIUM SULFATE 2 GM/50ML IV SOLN
2.0000 g | Freq: Once | INTRAVENOUS | Status: AC
  Administered 2017-05-16: 2 g via INTRAVENOUS
  Filled 2017-05-16: qty 50

## 2017-05-16 MED ORDER — MAGNESIUM OXIDE 400 MG PO TABS
400.0000 mg | ORAL_TABLET | Freq: Two times a day (BID) | ORAL | Status: DC
  Filled 2017-05-16: qty 1

## 2017-05-16 MED ORDER — ONDANSETRON 4 MG PO TBDP
4.0000 mg | ORAL_TABLET | Freq: Once | ORAL | Status: AC | PRN
  Administered 2017-05-16: 4 mg via ORAL

## 2017-05-16 MED ORDER — ALBUMIN HUMAN 25 % IV SOLN
50.0000 g | Freq: Once | INTRAVENOUS | Status: DC | PRN
  Filled 2017-05-16: qty 200

## 2017-05-16 MED ORDER — LORAZEPAM 2 MG/ML IJ SOLN
0.5000 mg | Freq: Three times a day (TID) | INTRAMUSCULAR | Status: DC | PRN
  Administered 2017-05-16 – 2017-05-17 (×2): 0.5 mg via INTRAVENOUS
  Filled 2017-05-16 (×2): qty 1

## 2017-05-16 MED ORDER — ONDANSETRON HCL 4 MG/2ML IJ SOLN
4.0000 mg | Freq: Once | INTRAMUSCULAR | Status: AC
  Administered 2017-05-16: 4 mg via INTRAVENOUS
  Filled 2017-05-16: qty 2

## 2017-05-16 NOTE — ED Notes (Signed)
Nurse will call me back for report. Nurse busy at this time.

## 2017-05-16 NOTE — H&P (Signed)
TRH H&P   Patient Demographics:    Frank Tucker, is a 56 y.o. male  MRN: [id number]   DOB - 1961/08/29  Admit Date - 05/16/2017  Outpatient Primary MD for the patient is Patient, No Pcp Per  Outpatient Specialists: DR Benay Spice    Patient coming from: Home  Chief Complaint  Patient presents with  . Abdominal Pain      HPI:    Frank Tucker  is a 56 y.o. 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  He comes back today with continued abdominal pain along with nausea vomiting, in the ER nausea vomiting was recurrent and I was called to admit him. He denies any fever or chills, no headache, no chest chest pain or shortness of breath at rest, no diarrhea, no blood in stool or urine. No focal weakness.    Review of systems:    In addition to the HPI above,   No Fever-chills, No Headache, No changes with Vision or hearing, No problems swallowing food or Liquids, No Chest pain, Cough or Shortness of Breath, Positive generalized Abdominal pain associated with Nausea and Vommitting, he is somewhat constipated No Blood in stool or Urine, No dysuria, No new skin rashes or bruises, No new joints pains-aches,  No new weakness, tingling, numbness in any extremity, does have generalized weakness No recent weight gain or loss, No polyuria, polydypsia or polyphagia, No significant Mental Stressors.  A full 10 point Review of Systems was  done, except as stated above, all other Review of Systems were negative.   With Past History of the following :    Past Medical History:  Diagnosis Date  . Ascites   . 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 Peritoneal carcinomatosis [facility])       Past Surgical History:  Procedure Laterality Date  . ESOPHAGOGASTRODUODENOSCOPY (EGD) WITH PROPOFOL N/A 12/29/2016   Procedure: ESOPHAGOGASTRODUODENOSCOPY (EGD) WITH PROPOFOL;  Surgeon: Doran Stabler, MD;  Location: [facility] ENDOSCOPY;  Service: Gastroenterology;  Laterality: N/A;  . IR GENERIC HISTORICAL  01/20/2017   IR FLUORO GUIDE PORT INSERTION RIGHT 01/20/2017 Sandi Mariscal,  MD [facility]  . IR GENERIC HISTORICAL  01/20/2017   IR US GUIDE VASC ACCESS RIGHT 01/20/2017 Sandi Mariscal, MD [facility]  . IR PARACENTESIS  05/10/2017  . PARACENTESIS  12/25/2016      Social History:     Social History  Substance Use Topics  . Smoking status: Never Smoker  . Smokeless tobacco: Never Used  . Alcohol use No         Family History :     Family History  Problem Relation Age of Onset  . Brain cancer Mother   . CAD Father   . Alzheimer's disease Father       Home Medications:   Prior to Admission medications   Medication Sig Start Date End Date Taking? Authorizing Provider  ADULT TPN Inject into the vein continuous. Nutritional TPN, [facility] in [place], Vit 1 & 2 infused in 2000 ml bag, goes continuously for 24 hours   Yes [provider]  bisacodyl (DULCOLAX) 10 MG suppository Place 1 suppository (10 mg total) rectally every 12 (twelve) hours as needed for mild constipation or moderate constipation. 04/08/17  Yes Barton Dubois, MD  feeding supplement, ENSURE ENLIVE, (ENSURE ENLIVE) LIQD Take 237 mLs by mouth 2 (two) times daily between meals.   Yes [provider]  HYDROmorphone (DILAUDID) 2 MG tablet Take 1 tablet (2 mg total) by mouth every 4 (four) hours as needed for severe pain. 05/15/17  Yes Robbie Lis, MD  LORazepam (ATIVAN) 1 MG tablet Take 1 tablet (1 mg total) by mouth every 6 (six) hours as needed (nausea). DO NOT DRIVE 5/63/14  Yes Ladell Pier, MD  nystatin (MYCOSTATIN/NYSTOP) powder Apply topically 2 (two) times daily. To under arm rash 03/04/17  Yes Ladell Pier, MD  ondansetron (ZOFRAN) 8 MG tablet Take 1 tablet (8 mg total) by mouth every 8 (eight) hours as needed for nausea or vomiting. Begin 3 days after chemo. 05/11/17  Yes Ladell Pier, MD  oxyCODONE-acetaminophen (PERCOCET/ROXICET) 5-325 MG tablet Take 1-2 tablets by mouth every 4 (four) hours as needed for severe pain.   Yes [provider]  pantoprazole (PROTONIX) 40 MG tablet Take 1 tablet (40 mg total) by mouth 2 (two) times daily. 04/08/17  Yes Barton Dubois, MD  polyethylene glycol [facility] / GLYCOLAX) packet Take 17 g by mouth daily. 04/09/17  Yes Barton Dubois, MD  acetaminophen (TYLENOL) 325 MG tablet Take 2 tablets (650 mg total) by mouth every 6 (six) hours as needed for mild pain or headache (or Fever >/= 101). Patient not taking: Reported on 05/16/2017 04/08/17   Barton Dubois, MD  dexamethasone (DECADRON) 4 MG tablet Take 1 tablet (4 mg total) by mouth 2 (two) times daily. For 3 days. Begin day after chemo. Patient not taking: Reported on 05/16/2017 03/22/17   Ladell Pier, MD  feeding supplement (BOOST HIGH PROTEIN) LIQD Take 237 mLs by mouth 3 (three) times daily between meals. Patient not taking: Reported on 05/16/2017 04/08/17   Barton Dubois, MD  folic acid (FOLVITE) 1 MG tablet Take 1 tablet (1 mg total) by mouth daily. Patient not taking: Reported on 05/16/2017 01/12/17   Owens Shark, NP  magnesium oxide (MAG-OX) 400 MG tablet Take 1 tablet (400 mg total) by mouth 2 (two) times daily. Patient not taking: Reported on 05/16/2017 04/26/17   Ladell Pier, MD  metoCLOPramide (REGLAN) 10 MG tablet Take 1 tablet (10 mg total) by mouth 3 (three) times daily  before meals. DO NOT TAKE  COMPAZINE. Patient not taking: Reported on 05/14/2017 03/18/17   Ladell Pier, MD     Allergies:    No Known Allergies   Physical Exam:   Vitals  Blood pressure 118/82, pulse (!) 107, temperature 97.4 F (36.3 C), temperature source Oral, resp. rate 20, height 5\' 9"  (1.753 m), weight 90.7 kg (200 lb), SpO2 94 %.   1. General Middle-aged white male lying in bed in mild abdominal pain and discomfort,  2. Normal affect and insight, Not Suicidal or Homicidal, Awake Alert, Oriented X 3.  3. No F.N deficits, ALL C.Nerves Intact, Strength 5/5 all 4 extremities, Sensation intact all 4 extremities, Plantars down going.  4. Ears and Eyes appear Normal, Conjunctivae clear, PERRLA. Moist Oral Mucosa.  5. Supple Neck, No JVD, No cervical lymphadenopathy appriciated, No Carotid Bruits.  6. Symmetrical Chest wall movement, Good air movement bilaterally, CTAB. Right subclavian Port-A-Cath in place.  7. RRR, No Gallops, Rubs or Murmurs, No Parasternal Heave.  8. Positive Bowel Sounds, Abdomen soft but moderately distended, No point tenderness, No organomegaly appriciated,No rebound -guarding or rigidity.  9.  No Cyanosis, Normal Skin Turgor, No Skin Rash or Bruise.  10. Good muscle tone,  joints appear normal , no effusions, Normal ROM.  11. No Palpable Lymph Nodes in Neck or Axillae      Data Review:    CBC  Recent Labs Lab 05/11/17 1014 05/14/17 1848 05/15/17 0542 05/16/17 1330  WBC 11.5* 12.5* 14.6* 17.9*  HGB 8.5* 9.6* 8.9* 8.9*  HCT 26.8* 29.4* 27.4* 27.5*  PLT 619* 714* 684* 587*  MCV 101.9* 98.7 100.4* 98.9  MCH 32.3 32.2 32.6 32.0  MCHC 31.7* 32.7 32.5 32.4  RDW 15.6* 15.2 15.2 14.8  LYMPHSABS 1.1  --   --   --   MONOABS 1.2*  --   --   --   EOSABS 0.0  --   --   --   BASOSABS 0.0  --   --   --    ------------------------------------------------------------------------------------------------------------------  Chemistries   Recent Labs Lab 05/11/17 1014  05/14/17 1848 05/15/17 0542 05/16/17 1330  NA 128* 128* 128* 127*  K 5.1 4.6 4.7 4.9  CL  --  94* 94* 93*  CO2 26 22 24 22   GLUCOSE 92 119* 121* 126*  BUN 26.4* 21* 22* 18  CREATININE 2.0* 1.73* 1.64* 1.49*  CALCIUM 8.4 8.4* 8.5* 8.1*  MG 1.5  --   --   --   AST 15 17  --  34  ALT 7 10*  --  10*  ALKPHOS 130 114  --  100  BILITOT <0.22 0.3  --  0.7   ------------------------------------------------------------------------------------------------------------------ estimated creatinine clearance is 63.1 mL/min (A) (by C-G formula based on SCr of 1.49 mg/dL (H)). ------------------------------------------------------------------------------------------------------------------ No results for input(s): TSH, T4TOTAL, T3FREE, THYROIDAB in the last 72 hours.  Invalid input(s): FREET3  Coagulation profile  Recent Labs Lab 05/15/17 0542  INR 0.85   ------------------------------------------------------------------------------------------------------------------- No results for input(s): DDIMER in the last 72 hours. -------------------------------------------------------------------------------------------------------------------  Cardiac Enzymes No results for input(s): CKMB, TROPONINI, MYOGLOBIN in the last 168 hours.  Invalid input(s): CK ------------------------------------------------------------------------------------------------------------------    Component Value Date/Time   BNP 8.5 12/16/2016 1209     ---------------------------------------------------------------------------------------------------------------  Urinalysis    Component Value Date/Time   COLORURINE YELLOW 05/16/2017 1314   APPEARANCEUR CLEAR 05/16/2017 1314   LABSPEC 1.030 05/16/2017 1314   PHURINE 5.0 05/16/2017 1314   GLUCOSEU NEGATIVE  05/16/2017 [place] 05/16/2017 1314   [place] 05/16/2017 1314   KETONESUR NEGATIVE 05/16/2017 1314   PROTEINUR 30 (A) 05/16/2017  1314   NITRITE NEGATIVE 05/16/2017 1314   LEUKOCYTESUR NEGATIVE 05/16/2017 1314    ----------------------------------------------------------------------------------------------------------------   Imaging Results:    Ct Abdomen Pelvis Wo Contrast  Result Date: 05/14/2017 CLINICAL DATA:  Acute onset of severe generalized abdominal pain, nausea and vomiting. Initial encounter. EXAM: CT ABDOMEN AND PELVIS WITHOUT CONTRAST TECHNIQUE: Multidetector CT imaging of the abdomen and pelvis was performed following the standard protocol without IV contrast. COMPARISON:  CT of the abdomen and pelvis performed 03/31/2017 and 12/16/2016 FINDINGS: Lower chest: Trace bilateral pleural effusions are noted, left greater than right, with associated atelectasis. The visualized portions of the mediastinum are grossly unremarkable. There is apparent wall thickening at the distal esophagus, better characterized on coronal images. An underlying mass cannot be excluded. Endoscopy could be considered for further evaluation. Hepatobiliary: The liver is unremarkable in appearance. The gallbladder is unremarkable in appearance. The common bile duct remains normal in caliber. Pancreas: The pancreas is within normal limits. Spleen: The spleen is unremarkable in appearance. Adrenals/Urinary Tract: The adrenal glands are unremarkable in appearance. Nonspecific perinephric stranding is noted bilaterally. The kidneys are otherwise unremarkable in appearance. There is no evidence of hydronephrosis. No renal or ureteral stones are identified. Stomach/Bowel: The stomach is unremarkable in appearance. There is wall thickening along the visualized small bowel, raising question for infectious or inflammatory ileitis. The appendix is normal in caliber, without evidence of appendicitis. The patient's peritoneal carcinomatosis abuts the splenic flexure of the colon. The colon is otherwise unremarkable in appearance. Vascular/Lymphatic: The  abdominal aorta is unremarkable in appearance. The inferior vena cava is grossly unremarkable. No retroperitoneal lymphadenopathy is seen. No pelvic sidewall lymphadenopathy is identified. Reproductive: The bladder is mildly distended and grossly unremarkable. The prostate remains borderline normal in size. Other: Diffuse peritoneal carcinomatosis is noted within the abdomen and pelvis. Large volume ascites is noted throughout the abdomen and pelvis. Musculoskeletal: No acute osseous abnormalities are identified. The visualized musculature is unremarkable in appearance. IMPRESSION: 1. Diffuse peritoneal carcinomatosis within the abdomen and pelvis. 2. Large volume ascites noted throughout the abdomen and pelvis. 3. Peritoneal carcinomatosis abuts the splenic flexure of the colon. Colon otherwise unremarkable. 4. Wall thickening along the visualized small bowel, raising question for infectious or inflammatory ileitis. 5. Trace bilateral pleural effusions, left greater than right, with associated atelectasis. 6. Apparent wall thickening at the distal esophagus, better characterized on coronal images. This appears to be new from December. Underlying mass cannot be excluded. Endoscopy could be considered for further evaluation. Electronically Signed   By: Garald Balding M.D.   On: 05/14/2017 22:37   Ct Abdomen Pelvis W Contrast  Result Date: 05/16/2017 CLINICAL DATA:  Increased abdominal pain and nausea. History of stomach cancer, ascites tapped on Friday. Tachypnea. Slight jaundice. EXAM: CT ABDOMEN AND PELVIS WITH CONTRAST TECHNIQUE: Multidetector CT imaging of the abdomen and pelvis was performed using the standard protocol following bolus administration of intravenous contrast. CONTRAST:  193mL ISOVUE-300 IOPAMIDOL (ISOVUE-300) INJECTION 61% COMPARISON:  CT abdomen dated 05/14/2017 FINDINGS: Lower chest: Small bilateral pleural effusions with adjacent atelectasis. Fluid/debris within the mildly distended lower  esophagus. Hepatobiliary: No focal liver abnormality is seen. Gallbladder is mildly distended but otherwise unremarkable. No bile duct dilatation seen. Pancreas: Unremarkable. No pancreatic ductal dilatation or surrounding inflammatory changes. Spleen: Normal in size without focal abnormality. Adrenals/Urinary Tract: Adrenal glands appear  normal. Kidneys are unremarkable without mass, stone or hydronephrosis. No ureteral or bladder calculi identified. Bladder is unremarkable. Stomach/Bowel: Bowel is normal in caliber. Stomach is moderately distended with fluid. Walls of the proximal small bowel appear thickened, likely reactive to the surrounding ascites. The more proximal duodenum is filled with fluid but not significantly dilated. Vascular/Lymphatic: No significant vascular findings are present. No enlarged abdominal or pelvic lymph nodes. Reproductive: Prostate is unremarkable. Other: Large volume ascites throughout the abdomen and pelvis. Again noted is the extensive omental thickening/carcinomatosis. No free intraperitoneal air. Musculoskeletal: No acute or suspicious osseous finding. IMPRESSION: 1. Large volume ascites throughout the abdomen and pelvis. 2. Diffuse soft tissue density omental thickening and nodularity, consistent with patient's known carcinomatosis. 3. Majority of the small bowel has thickened walls, most prominently seen within the jejunum, likely reactive to the surrounding ascites and/or involvement by carcinomatosis. Stomach and proximal duodenum are fluid-filled but not significantly distended, and the lower esophagus is mildly distended with fluid, perhaps indicating a mild partial obstruction related to the small bowel wall thickening. The fluid within the esophagus may predispose the patient to aspiration. 4. Small bilateral pleural effusions. Electronically Signed   By: Franki Cabot M.D.   On: 05/16/2017 15:22   US Paracentesis  Result Date: 05/15/2017 INDICATION: Patient with  mesothelioma of the abdominal cavity and peritoneal carcinomatosis with recurrent abdominal ascites. Request is made for therapeutic paracentesis. EXAM: ULTRASOUND GUIDED THERAPEUTIC PARACENTESIS MEDICATIONS: 1% lidocaine COMPLICATIONS: None immediate. PROCEDURE: Informed written consent was obtained from the patient after a discussion of the risks, benefits and alternatives to treatment. A timeout was performed prior to the initiation of the procedure. Initial ultrasound scanning demonstrates a large amount of ascites within the right lower abdominal quadrant. The right lower abdomen was prepped and draped in the usual sterile fashion. 1% lidocaine was used for local anesthesia. Following this, a 19 gauge, 7-cm, Yueh catheter was introduced. An ultrasound image was saved for documentation purposes. The paracentesis was performed. The catheter was removed and a dressing was applied. The patient tolerated the procedure well without immediate post procedural complication. FINDINGS: A total of approximately 5.4 L of serous fluid was removed. IMPRESSION: Successful ultrasound-guided paracentesis yielding 5.4 liters of peritoneal fluid. The patient normally has a 4 L max for his outpatient paracenteses. He was not given a maximum today but did not to go over what we removed; therefore, the procedure was terminated. Read by: Saverio Danker, PA-C Electronically Signed   By: Sandi Mariscal M.D.   On: 05/15/2017 10:43        Assessment & Plan:     1. Recurrent abdominal pain, nausea vomiting and radiological evidence of partial small bowel obstruction in a patient with mesothelial peritoneal carcinomatosis with recurrent ascites which is resistant to chemotherapy and patient now appears terminal.  He seems to have failed supportive care and conventional treatment and chemotherapy, problems seem to be recurrent, he is being admitted for symptom control and comfort measures, he now wishes to be DO NOT RESUSCITATE and is  agreeable to seeing palliative care will be consulted. Dr. Benay Spice his primary oncologist has been added to the treatment team, he will get bowel rest, IV TNA will be continued with gentle normal saline for hydration, goal of care will be comfort. We'll also place him on gentle bowel regimen along with antinausea medication and pain control medications. Will order ultrasound-guided repeat therapeutic and diagnostic paracentesis tomorrow morning.  2. Dehydration, hyponatremia, protein calorie malnutrition moderate to severe  along with generalized weakness. Due to #1 above. Gentle IV fluids and IV TNA per pharmacy, he is on home IV TNA at 85 mL an hour which pharmacy will continue via home pump. Monitor electrolytes tomorrow.  3. Leukocytosis. Could be reactionary, UA also borderline. Will place him on Rocephin treating him for possible UTI, he is at risk for SBP as well. Will order paratonia fluid cell count and culture along with urine culture.  4. CK D stage III. Creatinine seems to be around baseline of 1.4. Monitor.  5. Anemia of chronic disease. Monitor.    DVT Prophylaxis Heparin    AM Labs Ordered, also please review Full Orders  Family Communication: Admission, patients condition and plan of care including tests being ordered have been discussed with the patient and friend  who indicate understanding and agree with the plan and Code Status.  Code Status DNR  Likely DC to  Home  Condition GUARDED    Consults called: Pall Care  Admission status: Inpt    Time spent in minutes : 30   Lala Lund M.D on 05/16/2017 at 4:44 PM  Between 7am to 7pm - Pager - [phone] ( page via [facility], text pages only, please mention full 10 digit call back number).  After 7pm go to [email or web address] - password [facility]  [facility] - Office  [phone]

## 2017-05-16 NOTE — Progress Notes (Signed)
PHARMACY - ADULT TOTAL PARENTERAL NUTRITION CONSULT NOTE   Pharmacy Consult for TPN Indication: Partial SBO, malnutrition  Patient Measurements: Height: 5\' 9"  (175.3 cm) Weight: 200 lb (90.7 kg) IBW/kg (Calculated) : 70.7 TPN AdjBW (KG): 75.7 Body mass index is 29.53 kg/m. 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  He was started on TPN at home for unintentional weight loss along with dehydration and hyponatremia; Pharmacy is consulted to continue managing upon admission.  Significant events:   Today:    Glucose - no hx DM, serum glucose 126  Electrolytes - Na low 127, Mg low 1.6, all others wnl including corrected calcium (10.1)  Renal - SCr elevated at 1.49, improved from recent values  LFTs - wnl, albumin low  TGs - check in AM  Prealbumin - check in AM  NUTRITIONAL GOALS                                                                                             RD recs: pending Clinimix E/15 at a goal rate of 10ml/hr over 24hr + 20% fat emulsion at 38ml/hr over 12hr to provide: 90g/day protein, 1758Kcal/day. This is the best match to home formula.  PLAN                                                                                                                         At 1800 today:  Magnesium sulfate 2g IV x 1  Change to hospital formulation of Clinimix E 5/15 at 69ml/hr.  20% fat emulsion at 71ml/hr x 12 hrs  TPN to contain standard multivitamins daily and trace elements  MWF.  IVF  per MD: NS at 156ml/hr.  Add SSI q4h sensitive scale .   TPN lab panels on Mondays & Thursdays.  F/u daily.  Peggyann Juba, PharmD, BCPS Pager: [phone] 05/16/2017,5:22 PM

## 2017-05-16 NOTE — ED Notes (Signed)
Patient transported to CT 

## 2017-05-16 NOTE — ED Triage Notes (Signed)
Pt with increased abd pain and nausea. Pt has stomach cancer, Ascities noted, tapped on Friday due to this, noted Tachypnea, slight jaundice color, pt uncomfortable. Port accessed with TPN infusing.

## 2017-05-16 NOTE — ED Provider Notes (Signed)
[facility] DEPT Provider Note   CSN: [id number] Arrival date & time: 05/16/17  1244     History   Chief Complaint Chief Complaint  Patient presents with  . Abdominal Pain    HPI Frank Tucker is a 56 y.o. male.  56 yo M with peritoneal carcinomatosis. He has been having worsening of his symptoms over the past month or so. Was recently discharged yesterday from the hospital after having large volume ascites and intractable nausea and vomiting. After paracentesis these symptoms seem to have resolved. He is able have a large meal last night. Today he's been unable to keep anything down. Usually stays in his stomach for about 2 hours and gets regurgitated. Having severe diffuse abdominal pain with this. Denies fevers or chills. Denies diarrhea. He is unable to tolerate any of his medicines at home because he throws them all up.   The history is provided by the patient.  Abdominal Pain   This is a recurrent problem. The current episode started 2 days ago. The problem occurs constantly. The problem has not changed since onset.The pain is located in the generalized abdominal region. The quality of the pain is sharp and shooting. The pain is at a severity of 10/10. The pain is severe. Associated symptoms include nausea and vomiting. Pertinent negatives include fever, diarrhea, headaches, arthralgias and myalgias. Nothing aggravates the symptoms. Nothing relieves the symptoms.    Past Medical History:  Diagnosis Date  . Ascites   . 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 Peritoneal carcinomatosis [facility])     Patient Active Problem List   Diagnosis Date Noted  . Intractable pain 05/14/2017  . Generalized abdominal pain   . Nausea with vomiting 03/31/2017  . Dehydration 03/31/2017  . Chemotherapy induced neutropenia ([place]) 03/31/2017  . Unintentional weight loss 03/31/2017  . 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 Hyponatremia 03/31/2017  . AKI (acute kidney injury) ([place])  03/31/2017  . Port catheter in place 01/29/2017  . 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 Peritoneal carcinomatosis ([place]) 12/16/2016  . Malignant ascites 12/16/2016    Past Surgical History:  Procedure Laterality Date  . ESOPHAGOGASTRODUODENOSCOPY (EGD) WITH PROPOFOL N/A 12/29/2016   Procedure: ESOPHAGOGASTRODUODENOSCOPY (EGD) WITH PROPOFOL;  Surgeon: Doran Stabler, MD;  Location: [facility] ENDOSCOPY;  Service: Gastroenterology;  Laterality: N/A;  . IR GENERIC HISTORICAL  01/20/2017   IR FLUORO GUIDE PORT INSERTION RIGHT 01/20/2017 Sandi Mariscal, MD [facility]-INTERV RAD  . IR GENERIC HISTORICAL  01/20/2017   IR US GUIDE VASC ACCESS RIGHT 01/20/2017 Sandi Mariscal, MD [facility]-INTER[facility] RAD  . IR PARACENTESIS  05/10/2017  . PARACENTESIS  12/25/2016       Home Medications    Prior to Admission medications   Medication Sig Start Date End Date Taking? Authorizing Provider  ADULT TPN Inject into the vein continuous. Nutritional TPN, [facility] in [place], Vit 1 & 2 infused in 2000 ml bag, goes continuously for 24 hours   Yes [provider]  bisacodyl (DULCOLAX) 10 MG suppository Place 1 suppository (10 mg total) rectally every 12 (twelve) hours as needed for mild constipation or moderate constipation. 04/08/17  Yes Barton Dubois, MD  feeding supplement, ENSURE ENLIVE, (ENSURE ENLIVE) LIQD Take 237 mLs by mouth 2 (two) times daily between meals.   Yes [provider]  HYDROmorphone (DILAUDID) 2 MG tablet Take 1 tablet (2 mg total) by mouth every 4 (four) hours as needed for severe pain. 05/15/17  Yes Robbie Lis, MD  LORazepam (  ATIVAN) 1 MG tablet Take 1 tablet (1 mg total) by mouth every 6 (six) hours as needed (nausea). DO NOT DRIVE 1/66/06  Yes Ladell Pier, MD  nystatin (MYCOSTATIN/NYSTOP) powder Apply topically 2 (two) times daily. To under arm rash 03/04/17  Yes Ladell Pier, MD  ondansetron (ZOFRAN) 8 MG tablet Take 1 tablet (8 mg total) by mouth every 8 (eight) hours as needed  for nausea or vomiting. Begin 3 days after chemo. 05/11/17  Yes Ladell Pier, MD  oxyCODONE-acetaminophen (PERCOCET/ROXICET) 5-325 MG tablet Take 1-2 tablets by mouth every 4 (four) hours as needed for severe pain.   Yes [provider]  pantoprazole (PROTONIX) 40 MG tablet Take 1 tablet (40 mg total) by mouth 2 (two) times daily. 04/08/17  Yes Barton Dubois, MD  polyethylene glycol [facility] / GLYCOLAX) packet Take 17 g by mouth daily. 04/09/17  Yes Barton Dubois, MD  acetaminophen (TYLENOL) 325 MG tablet Take 2 tablets (650 mg total) by mouth every 6 (six) hours as needed for mild pain or headache (or Fever >/= 101). Patient not taking: Reported on 05/16/2017 04/08/17   Barton Dubois, MD  dexamethasone (DECADRON) 4 MG tablet Take 1 tablet (4 mg total) by mouth 2 (two) times daily. For 3 days. Begin day after chemo. Patient not taking: Reported on 05/16/2017 03/22/17   Ladell Pier, MD  feeding supplement (BOOST HIGH PROTEIN) LIQD Take 237 mLs by mouth 3 (three) times daily between meals. Patient not taking: Reported on 05/16/2017 04/08/17   Barton Dubois, MD  folic acid (FOLVITE) 1 MG tablet Take 1 tablet (1 mg total) by mouth daily. Patient not taking: Reported on 05/16/2017 01/12/17   Owens Shark, NP  magnesium oxide (MAG-OX) 400 MG tablet Take 1 tablet (400 mg total) by mouth 2 (two) times daily. Patient not taking: Reported on 05/16/2017 04/26/17   Ladell Pier, MD  metoCLOPramide (REGLAN) 10 MG tablet Take 1 tablet (10 mg total) by mouth 3 (three) times daily before meals. DO NOT TAKE COMPAZINE. Patient not taking: Reported on 05/14/2017 03/18/17   Ladell Pier, MD    Family History Family History  Problem Relation Age of Onset  . Brain cancer Mother   . CAD Father   . Alzheimer's disease Father     Social History Social History  Substance Use Topics  . Smoking status: Never Smoker  . Smokeless tobacco: Never Used  . Alcohol use No     Allergies   Patient has  no known allergies.   Review of Systems Review of Systems  Constitutional: Negative for chills and fever.  HENT: Negative for congestion and facial swelling.   Eyes: Negative for discharge and visual disturbance.  Respiratory: Negative for shortness of breath.   Cardiovascular: Negative for chest pain and palpitations.  Gastrointestinal: Positive for abdominal pain, nausea and vomiting. Negative for diarrhea.  Musculoskeletal: Negative for arthralgias and myalgias.  Skin: Negative for color change and rash.  Neurological: Negative for tremors, syncope and headaches.  Psychiatric/Behavioral: Negative for confusion and dysphoric mood.     Physical Exam Updated Vital Signs BP 128/80 (BP Location: Left Arm)   Pulse (!) 109   Temp 97.4 F (36.3 C) (Oral)   Resp 20   Ht 5\' 9"  (1.753 m)   Wt 90.7 kg (200 lb)   SpO2 96%   BMI 29.53 kg/m   Physical Exam  Constitutional: He is oriented to person, place, and time. He appears cachectic.  Chronically ill-appearing  HENT:  Head: Normocephalic and atraumatic.  Eyes: EOM are normal. Pupils are equal, round, and reactive to light.  Neck: Normal range of motion. Neck supple. No JVD present.  Cardiovascular: Normal rate and regular rhythm.  Exam reveals no gallop and no friction rub.   No murmur heard. Pulmonary/Chest: No respiratory distress. He has no wheezes.  Abdominal: He exhibits no distension and no mass. There is tenderness. There is no rebound and no guarding.  Diffuse with positive fluid wave  Musculoskeletal: Normal range of motion.  Neurological: He is alert and oriented to person, place, and time.  Skin: No rash noted. No pallor.  Psychiatric: He has a normal mood and affect. His behavior is normal.  Nursing note and vitals reviewed.    ED Treatments / Results  Labs (all labs ordered are listed, but only abnormal results are displayed) Labs Reviewed  COMPREHENSIVE METABOLIC PANEL - Abnormal; Notable for the following:        Result Value   Sodium 127 (*)    Chloride 93 (*)    Glucose, Bld 126 (*)    Creatinine, Ser 1.49 (*)    Calcium 8.1 (*)    Total Protein 5.7 (*)    Albumin 1.5 (*)    ALT 10 (*)    GFR calc non Af Amer 52 (*)    GFR calc Af Amer 60 (*)    All other components within normal limits  CBC - Abnormal; Notable for the following:    WBC 17.9 (*)    RBC 2.78 (*)    Hemoglobin 8.9 (*)    HCT 27.5 (*)    Platelets 587 (*)    All other components within normal limits  URINALYSIS, ROUTINE W REFLEX MICROSCOPIC - Abnormal; Notable for the following:    Protein, ur 30 (*)    Squamous Epithelial / LPF 0-5 (*)    All other components within normal limits  LIPASE, BLOOD    EKG  EKG Interpretation None       Radiology Ct Abdomen Pelvis Wo Contrast  Result Date: 05/14/2017 CLINICAL DATA:  Acute onset of severe generalized abdominal pain, nausea and vomiting. Initial encounter. EXAM: CT ABDOMEN AND PELVIS WITHOUT CONTRAST TECHNIQUE: Multidetector CT imaging of the abdomen and pelvis was performed following the standard protocol without IV contrast. COMPARISON:  CT of the abdomen and pelvis performed 03/31/2017 and 12/16/2016 FINDINGS: Lower chest: Trace bilateral pleural effusions are noted, left greater than right, with associated atelectasis. The visualized portions of the mediastinum are grossly unremarkable. There is apparent wall thickening at the distal esophagus, better characterized on coronal images. An underlying mass cannot be excluded. Endoscopy could be considered for further evaluation. Hepatobiliary: The liver is unremarkable in appearance. The gallbladder is unremarkable in appearance. The common bile duct remains normal in caliber. Pancreas: The pancreas is within normal limits. Spleen: The spleen is unremarkable in appearance. Adrenals/Urinary Tract: The adrenal glands are unremarkable in appearance. Nonspecific perinephric stranding is noted bilaterally. The kidneys are  otherwise unremarkable in appearance. There is no evidence of hydronephrosis. No renal or ureteral stones are identified. Stomach/Bowel: The stomach is unremarkable in appearance. There is wall thickening along the visualized small bowel, raising question for infectious or inflammatory ileitis. The appendix is normal in caliber, without evidence of appendicitis. The patient's peritoneal carcinomatosis abuts the splenic flexure of the colon. The colon is otherwise unremarkable in appearance. Vascular/Lymphatic: The abdominal aorta is unremarkable in appearance. The inferior vena cava is grossly unremarkable. No  retroperitoneal lymphadenopathy is seen. No pelvic sidewall lymphadenopathy is identified. Reproductive: The bladder is mildly distended and grossly unremarkable. The prostate remains borderline normal in size. Other: Diffuse peritoneal carcinomatosis is noted within the abdomen and pelvis. Large volume ascites is noted throughout the abdomen and pelvis. Musculoskeletal: No acute osseous abnormalities are identified. The visualized musculature is unremarkable in appearance. IMPRESSION: 1. Diffuse peritoneal carcinomatosis within the abdomen and pelvis. 2. Large volume ascites noted throughout the abdomen and pelvis. 3. Peritoneal carcinomatosis abuts the splenic flexure of the colon. Colon otherwise unremarkable. 4. Wall thickening along the visualized small bowel, raising question for infectious or inflammatory ileitis. 5. Trace bilateral pleural effusions, left greater than right, with associated atelectasis. 6. Apparent wall thickening at the distal esophagus, better characterized on coronal images. This appears to be new from December. Underlying mass cannot be excluded. Endoscopy could be considered for further evaluation. Electronically Signed   By: Garald Balding M.D.   On: 05/14/2017 22:37   Ct Abdomen Pelvis W Contrast  Result Date: 05/16/2017 CLINICAL DATA:  Increased abdominal pain and nausea.  History of stomach cancer, ascites tapped on Friday. Tachypnea. Slight jaundice. EXAM: CT ABDOMEN AND PELVIS WITH CONTRAST TECHNIQUE: Multidetector CT imaging of the abdomen and pelvis was performed using the standard protocol following bolus administration of intravenous contrast. CONTRAST:  159mL ISOVUE-300 IOPAMIDOL (ISOVUE-300) INJECTION 61% COMPARISON:  CT abdomen dated 05/14/2017 FINDINGS: Lower chest: Small bilateral pleural effusions with adjacent atelectasis. Fluid/debris within the mildly distended lower esophagus. Hepatobiliary: No focal liver abnormality is seen. Gallbladder is mildly distended but otherwise unremarkable. No bile duct dilatation seen. Pancreas: Unremarkable. No pancreatic ductal dilatation or surrounding inflammatory changes. Spleen: Normal in size without focal abnormality. Adrenals/Urinary Tract: Adrenal glands appear normal. Kidneys are unremarkable without mass, stone or hydronephrosis. No ureteral or bladder calculi identified. Bladder is unremarkable. Stomach/Bowel: Bowel is normal in caliber. Stomach is moderately distended with fluid. Walls of the proximal small bowel appear thickened, likely reactive to the surrounding ascites. The more proximal duodenum is filled with fluid but not significantly dilated. Vascular/Lymphatic: No significant vascular findings are present. No enlarged abdominal or pelvic lymph nodes. Reproductive: Prostate is unremarkable. Other: Large volume ascites throughout the abdomen and pelvis. Again noted is the extensive omental thickening/carcinomatosis. No free intraperitoneal air. Musculoskeletal: No acute or suspicious osseous finding. IMPRESSION: 1. Large volume ascites throughout the abdomen and pelvis. 2. Diffuse soft tissue density omental thickening and nodularity, consistent with patient's known carcinomatosis. 3. Majority of the small bowel has thickened walls, most prominently seen within the jejunum, likely reactive to the surrounding ascites  and/or involvement by carcinomatosis. Stomach and proximal duodenum are fluid-filled but not significantly distended, and the lower esophagus is mildly distended with fluid, perhaps indicating a mild partial obstruction related to the small bowel wall thickening. The fluid within the esophagus may predispose the patient to aspiration. 4. Small bilateral pleural effusions. Electronically Signed   By: Franki Cabot M.D.   On: 05/16/2017 15:22   US Paracentesis  Result Date: 05/15/2017 INDICATION: Patient with mesothelioma of the abdominal cavity and peritoneal carcinomatosis with recurrent abdominal ascites. Request is made for therapeutic paracentesis. EXAM: ULTRASOUND GUIDED THERAPEUTIC PARACENTESIS MEDICATIONS: 1% lidocaine COMPLICATIONS: None immediate. PROCEDURE: Informed written consent was obtained from the patient after a discussion of the risks, benefits and alternatives to treatment. A timeout was performed prior to the initiation of the procedure. Initial ultrasound scanning demonstrates a large amount of ascites within the right lower abdominal quadrant. The right lower  abdomen was prepped and draped in the usual sterile fashion. 1% lidocaine was used for local anesthesia. Following this, a 19 gauge, 7-cm, Yueh catheter was introduced. An ultrasound image was saved for documentation purposes. The paracentesis was performed. The catheter was removed and a dressing was applied. The patient tolerated the procedure well without immediate post procedural complication. FINDINGS: A total of approximately 5.4 L of serous fluid was removed. IMPRESSION: Successful ultrasound-guided paracentesis yielding 5.4 liters of peritoneal fluid. The patient normally has a 4 L max for his outpatient paracenteses. He was not given a maximum today but did not to go over what we removed; therefore, the procedure was terminated. Read by: Saverio Danker, PA-C Electronically Signed   By: Sandi Mariscal M.D.   On: 05/15/2017 10:43     Procedures Procedures (including critical care time)  Medications Ordered in ED Medications  HYDROmorphone (DILAUDID) injection 1 mg (not administered)  LORazepam (ATIVAN) injection 0.5 mg (not administered)  ondansetron (ZOFRAN-ODT) disintegrating tablet 4 mg (4 mg Oral Given 05/16/17 1317)  HYDROmorphone (DILAUDID) injection 1 mg (1 mg Intravenous Given 05/16/17 1329)  HYDROmorphone (DILAUDID) injection 1 mg (1 mg Intravenous Given 05/16/17 1417)  ondansetron (ZOFRAN) injection 4 mg (4 mg Intravenous Given 05/16/17 1417)  iopamidol (ISOVUE-300) 61 % injection (100 mLs  Contrast Given 05/16/17 1444)     Initial Impression / Assessment and Plan / ED Course  I have reviewed the triage vital signs and the nursing notes.  Pertinent labs & imaging results that were available during my care of the patient were reviewed by me and considered in my medical decision making (see chart for details).     56 yo M With diffuse abdominal pain nausea and vomiting. This recurrent issue for him. Was discharged from the hospital yesterday after improvement from the same. The family is looking for a different type of pain medicine that does not have to be swallowed to go home with. Will obtain a CT scan of the abdomen and pelvis to reassess.  CT scan with reaccumulation of his ascites. He also has some bowel wall thickening that the radiologist thinks is secondary to the cancer. There is some concerning findings suggesting a partial small bowel obstruction. Patient reassessed and still not able to tolerate by mouth. We'll discuss with the hospitalist.  The patients results and plan were reviewed and discussed.   Any x-rays performed were independently reviewed by myself.   Differential diagnosis were considered with the presenting HPI.  Medications  HYDROmorphone (DILAUDID) injection 1 mg (not administered)  LORazepam (ATIVAN) injection 0.5 mg (not administered)  ondansetron (ZOFRAN-ODT)  disintegrating tablet 4 mg (4 mg Oral Given 05/16/17 1317)  HYDROmorphone (DILAUDID) injection 1 mg (1 mg Intravenous Given 05/16/17 1329)  HYDROmorphone (DILAUDID) injection 1 mg (1 mg Intravenous Given 05/16/17 1417)  ondansetron (ZOFRAN) injection 4 mg (4 mg Intravenous Given 05/16/17 1417)  iopamidol (ISOVUE-300) 61 % injection (100 mLs  Contrast Given 05/16/17 1444)    Vitals:   05/16/17 1400 05/16/17 1430 05/16/17 1500 05/16/17 1528  BP: (!) 112/57 121/69 128/80 128/80  Pulse: (!) 109 (!) 108 (!) 103 (!) 109  Resp: 20   20  Temp:      TempSrc:      SpO2: 100% 96% 94% 96%  Weight:      Height:        Final diagnoses:  SBO (small bowel obstruction) (HCC)  Other ascites  Generalized abdominal pain    Admission/ observation were discussed with  the admitting physician, patient and/or family and they are comfortable with the plan.    Final Clinical Impressions(s) / ED Diagnoses   Final diagnoses:  SBO (small bowel obstruction) ([place])  Other ascites  Generalized abdominal pain    New Prescriptions New Prescriptions   No medications on file     Deno Etienne, DO 05/16/17 1621

## 2017-05-17 DIAGNOSIS — R188 Other ascites: Secondary | ICD-10-CM

## 2017-05-17 DIAGNOSIS — E440 Moderate protein-calorie malnutrition: Secondary | ICD-10-CM | POA: Insufficient documentation

## 2017-05-17 DIAGNOSIS — K56609 Unspecified intestinal obstruction, unspecified as to partial versus complete obstruction: Secondary | ICD-10-CM

## 2017-05-17 DIAGNOSIS — Z515 Encounter for palliative care: Secondary | ICD-10-CM

## 2017-05-17 DIAGNOSIS — C451 Mesothelioma of peritoneum: Secondary | ICD-10-CM

## 2017-05-17 DIAGNOSIS — R109 Unspecified abdominal pain: Secondary | ICD-10-CM

## 2017-05-17 DIAGNOSIS — Z7189 Other specified counseling: Secondary | ICD-10-CM

## 2017-05-17 DIAGNOSIS — R112 Nausea with vomiting, unspecified: Secondary | ICD-10-CM

## 2017-05-17 LAB — GLUCOSE, CAPILLARY
GLUCOSE-CAPILLARY: 109 mg/dL — AB (ref 65–99)
GLUCOSE-CAPILLARY: 126 mg/dL — AB (ref 65–99)
GLUCOSE-CAPILLARY: 138 mg/dL — AB (ref 65–99)
Glucose-Capillary: 99 mg/dL (ref 65–99)
Glucose-Capillary: 111 mg/dL — ABNORMAL HIGH (ref 65–99)
Glucose-Capillary: 104 mg/dL — ABNORMAL HIGH (ref 65–99)

## 2017-05-17 LAB — CBC
HEMATOCRIT: 24.9 % — AB (ref 39.0–52.0)
Hemoglobin: 8.1 g/dL — ABNORMAL LOW (ref 13.0–17.0)
MCH: 32 pg (ref 26.0–34.0)
MCHC: 32.5 g/dL (ref 30.0–36.0)
MCV: 98.4 fL (ref 78.0–100.0)
Platelets: 406 10*3/uL — ABNORMAL HIGH (ref 150–400)
RBC: 2.53 MIL/uL — ABNORMAL LOW (ref 4.22–5.81)
RDW: 15.3 % (ref 11.5–15.5)
WBC: 29.4 10*3/uL — AB (ref 4.0–10.5)

## 2017-05-17 LAB — COMPREHENSIVE METABOLIC PANEL
ALT: 14 U/L — ABNORMAL LOW (ref 17–63)
ANION GAP: 10 (ref 5–15)
AST: 33 U/L (ref 15–41)
Albumin: 1.2 g/dL — ABNORMAL LOW (ref 3.5–5.0)
Alkaline Phosphatase: 107 U/L (ref 38–126)
BUN: 20 mg/dL (ref 6–20)
CALCIUM: 7.9 mg/dL — AB (ref 8.9–10.3)
CHLORIDE: 95 mmol/L — AB (ref 101–111)
CO2: 22 mmol/L (ref 22–32)
Creatinine, Ser: 1.27 mg/dL — ABNORMAL HIGH (ref 0.61–1.24)
GFR calc non Af Amer: >60 mL/min (ref >60)
Glucose, Bld: 106 mg/dL — ABNORMAL HIGH (ref 65–99)
Potassium: 4.3 mmol/L (ref 3.5–5.1)
SODIUM: 127 mmol/L — AB (ref 135–145)
Total Bilirubin: 0.1 mg/dL — ABNORMAL LOW (ref 0.3–1.2)
Total Protein: 4.9 g/dL — ABNORMAL LOW (ref 6.5–8.1)

## 2017-05-17 LAB — DIFFERENTIAL
BASOS ABS: 0 10*3/uL (ref 0.0–0.1)
Basophils Relative: 0 %
EOS ABS: 0 10*3/uL (ref 0.0–0.7)
EOS PCT: 0 %
Lymphocytes Relative: 2 %
Lymphs Abs: 0.6 10*3/uL — ABNORMAL LOW (ref 0.7–4.0)
MONO ABS: 1.5 10*3/uL — AB (ref 0.1–1.0)
Monocytes Relative: 5 %
NEUTROS PCT: 93 %
Neutro Abs: 27.3 10*3/uL — ABNORMAL HIGH (ref 1.7–7.7)
WBC MORPHOLOGY: INCREASED

## 2017-05-17 LAB — MAGNESIUM: Magnesium: 1.8 mg/dL (ref 1.7–2.4)

## 2017-05-17 LAB — PHOSPHORUS: PHOSPHORUS: 3.8 mg/dL (ref 2.5–4.6)

## 2017-05-17 LAB — TRIGLYCERIDES: TRIGLYCERIDES: 107 mg/dL (ref <150)

## 2017-05-17 LAB — PREALBUMIN: PREALBUMIN: 9.6 mg/dL — AB (ref 18–38)

## 2017-05-17 MED ORDER — FAT EMULSION 20 % IV EMUL
240.0000 mL | INTRAVENOUS | Status: AC
  Administered 2017-05-17: 240 mL via INTRAVENOUS
  Filled 2017-05-17: qty 250

## 2017-05-17 MED ORDER — LORAZEPAM 2 MG/ML IJ SOLN
0.5000 mg | INTRAMUSCULAR | Status: DC | PRN
  Administered 2017-05-17 – 2017-05-19 (×5): 0.5 mg via INTRAVENOUS
  Filled 2017-05-17 (×5): qty 1

## 2017-05-17 MED ORDER — HYDROMORPHONE HCL 1 MG/ML IJ SOLN
1.0000 mg | INTRAMUSCULAR | Status: DC | PRN
  Administered 2017-05-17 (×3): 1 mg via INTRAVENOUS
  Filled 2017-05-17 (×3): qty 1

## 2017-05-17 MED ORDER — MAGNESIUM OXIDE 400 (241.3 MG) MG PO TABS
400.0000 mg | ORAL_TABLET | Freq: Two times a day (BID) | ORAL | Status: DC
  Administered 2017-05-17 – 2017-05-20 (×7): 400 mg via ORAL
  Filled 2017-05-17 (×7): qty 1

## 2017-05-17 MED ORDER — MORPHINE SULFATE ER 30 MG PO TBCR
30.0000 mg | EXTENDED_RELEASE_TABLET | Freq: Two times a day (BID) | ORAL | Status: DC
  Administered 2017-05-17 – 2017-05-20 (×7): 30 mg via ORAL
  Filled 2017-05-17 (×7): qty 1

## 2017-05-17 MED ORDER — TRACE MINERALS CR-CU-MN-SE-ZN 10-1000-500-60 MCG/ML IV SOLN
INTRAVENOUS | Status: AC
  Administered 2017-05-17: 18:00:00 via INTRAVENOUS
  Filled 2017-05-17: qty 1800

## 2017-05-17 NOTE — Progress Notes (Addendum)
Frank Tucker ID: Frank Tucker, male   DOB: [birth year], 56 y.o.   MRN: [id number]  PROGRESS NOTE    Frank Tucker  KCL:[id number] DOB: 1961/09/21 DOA: 05/16/2017  PCP: Frank Tucker, No Pcp Per   Brief Narrative:  56 year old male with medical history of peritoneal mesothelioma, just hospitalized 5/25 - 5/26 for abdominal pain, distention and nausea. He had paracentesis 5/26 with 5.4 L fluid removed, he went home hoping that his symptoms remain stable however he returned due to ongoing distention, nausea and poor po intake.    Assessment & Plan:   Principal Problem:   Nausea with vomiting, peritoneal carcinomatosis ([place]) /mesothelioma of peritoneum ([place]) / Possible SBO - Appreciate oncology following - Palliative care has also seen the pt in consultation - Continue supportive care with anti-emetics and analgesia as needed   Active Problems: Hyponatremia - Due to GI losses, dehydration versus malignancy - Continue to monitor sodium level  Leukocytosis - Most certainly increasing WBC count since recent admission concerning for intra-abd infection in the setting of ascites and worsening distention - Pt on rocephin  - When he has paracenteiss will get labs for fluid analysis   Anemia of chronic disease - Due to CKD and malignancy - Hgb stable   CKD stage 3 - Cr has improved since recent admission  Severe protein calorie malnutrition - In the context of chronic illness - Continue TPN  DVT prophylaxis: SubQ heparin  Code Status: DNR/DNI  Family Communication: no family at the bedside this am Disposition Plan: home once he feels better, once nausea controled and abd distention improves    Consultants:   Oncology  Palliative care  IR  Procedures:   None  Antimicrobials:   Rocephin 5/27 -->    Subjective: Still having nausea.   Objective: Vitals:   05/16/17 1818 05/16/17 2100 05/17/17 0554 05/17/17 1421  BP: 116/67 119/73 106/63 (!) 100/59  Pulse: (!) 114 (!) 124  (!) 107 100  Resp: 18 18 16 18   Temp: 97 F (36.1 C) 98.1 F (36.7 C) 98.4 F (36.9 C) 98.8 F (37.1 C)  TempSrc: Oral Axillary Oral Oral  SpO2: 93% 97% 97% 98%  Weight: 91.3 kg (201 lb 4.5 oz)     Height: 5\' 9"  (1.753 m)      No intake or output data in the 24 hours ending 05/17/17 1445 Filed Weights   05/16/17 1309 05/16/17 1818  Weight: 90.7 kg (200 lb) 91.3 kg (201 lb 4.5 oz)    Examination:  General exam: Appears calm and comfortable  Respiratory system: Clear to auscultation. Respiratory effort normal. Cardiovascular system: S1 & S2 heard, RRR. No JVD, murmurs, rubs, gallops or clicks. No pedal edema. Gastrointestinal system: Abdomen is softly distended, (+) fluid wave Central nervous system: Alert and oriented. No focal neurological deficits. Extremities: Symmetric 5 x 5 power. Skin: No rashes, lesions or ulcers Psychiatry: Judgement and insight appear normal. Mood & affect appropriate.   Data Reviewed: I have personally reviewed following labs and imaging studies  CBC:  Recent Labs Lab 05/11/17 1014 05/14/17 1848 05/15/17 0542 05/16/17 1330 05/17/17 0841  WBC 11.5* 12.5* 14.6* 17.9* 29.4*  NEUTROABS 9.1*  --   --   --  27.3*  HGB 8.5* 9.6* 8.9* 8.9* 8.1*  HCT 26.8* 29.4* 27.4* 27.5* 24.9*  MCV 101.9* 98.7 100.4* 98.9 98.4  PLT 619* 714* 684* 587* 494*   Basic Metabolic Panel:  Recent Labs Lab 05/11/17 1014 05/14/17 1848 05/15/17 0542 05/16/17 1330 05/17/17  0841  NA 128* 128* 128* 127* 127*  K 5.1 4.6 4.7 4.9 4.3  CL  --  94* 94* 93* 95*  CO2 26 22 24 22 22   GLUCOSE 92 119* 121* 126* 106*  BUN 26.4* 21* 22* 18 20  CREATININE 2.0* 1.73* 1.64* 1.49* 1.27*  CALCIUM 8.4 8.4* 8.5* 8.1* 7.9*  MG 1.5  --   --  1.6* 1.8  PHOS  --   --   --  3.5 3.8   GFR: Estimated Creatinine Clearance: 74.2 mL/min (A) (by C-G formula based on SCr of 1.27 mg/dL (H)). Liver Function Tests:  Recent Labs Lab 05/11/17 1014 05/14/17 1848 05/16/17 1330  05/17/17 0841  AST 15 17 34 33  ALT 7 10* 10* 14*  ALKPHOS 130 114 100 107  BILITOT <0.22 0.3 0.7 0.1*  PROT 5.1* 5.9* 5.7* 4.9*  ALBUMIN 1.3* 1.6* 1.5* 1.2*    Recent Labs Lab 05/14/17 1848 05/16/17 1330  LIPASE 31 26   No results for input(s): AMMONIA in the last 168 hours. Coagulation Profile:  Recent Labs Lab 05/15/17 0542  INR 0.85   Cardiac Enzymes: No results for input(s): CKTOTAL, CKMB, CKMBINDEX, TROPONINI in the last 168 hours. BNP (last 3 results) No results for input(s): PROBNP in the last 8760 hours. HbA1C: No results for input(s): HGBA1C in the last 72 hours. CBG:  Recent Labs Lab 05/16/17 1956 05/17/17 0013 05/17/17 0325 05/17/17 0721 05/17/17 1144  GLUCAP 128* 138* 126* 109* 99   Lipid Profile:  Recent Labs  05/17/17 0841  TRIG 107   Thyroid Function Tests: No results for input(s): TSH, T4TOTAL, FREET4, T3FREE, THYROIDAB in the last 72 hours. Anemia Panel: No results for input(s): VITAMINB12, FOLATE, FERRITIN, TIBC, IRON, RETICCTPCT in the last 72 hours. Urine analysis:    Component Value Date/Time   COLORURINE YELLOW 05/16/2017 1314   APPEARANCEUR CLEAR 05/16/2017 1314   LABSPEC 1.030 05/16/2017 1314   PHURINE 5.0 05/16/2017 1314   GLUCOSEU NEGATIVE 05/16/2017 1314   HGBUR NEGATIVE 05/16/2017 1314   BILIRUBINUR NEGATIVE 05/16/2017 1314   KETONESUR NEGATIVE 05/16/2017 1314   PROTEINUR 30 (A) 05/16/2017 1314   NITRITE NEGATIVE 05/16/2017 1314   LEUKOCYTESUR NEGATIVE 05/16/2017 1314   Sepsis Labs: @LABRCNTIP (procalcitonin:4,lacticidven:4)   )No results found for this or any previous visit (from the past 240 hour(s)).    Radiology Studies: Ct Abdomen Pelvis Wo Contrast Result Date: 05/14/2017 1. Diffuse peritoneal carcinomatosis within the abdomen and pelvis. 2. Large volume ascites noted throughout the abdomen and pelvis. 3. Peritoneal carcinomatosis abuts the splenic flexure of the colon. Colon otherwise unremarkable. 4. Wall  thickening along the visualized small bowel, raising question for infectious or inflammatory ileitis. 5. Trace bilateral pleural effusions, left greater than right, with associated atelectasis. 6. Apparent wall thickening at the distal esophagus, better characterized on coronal images. This appears to be new from December. Underlying mass cannot be excluded. Endoscopy could be considered for further evaluation. Electronically Signed   By: Garald Balding M.D.   On: 05/14/2017 22:37   Ct Abdomen Pelvis W Contrast Result Date: 05/16/2017 1. Large volume ascites throughout the abdomen and pelvis. 2. Diffuse soft tissue density omental thickening and nodularity, consistent with Frank Tucker's known carcinomatosis. 3. Majority of the small bowel has thickened walls, most prominently seen within the jejunum, likely reactive to the surrounding ascites and/or involvement by carcinomatosis. Stomach and proximal duodenum are fluid-filled but not significantly distended, and the lower esophagus is mildly distended with fluid, perhaps indicating a mild  partial obstruction related to the small bowel wall thickening. The fluid within the esophagus may predispose the Frank Tucker to aspiration. 4. Small bilateral pleural effusions.   US Paracentesis Result Date: 05/15/2017 Successful ultrasound-guided paracentesis yielding 5.4 liters of peritoneal fluid. The Frank Tucker normally has a 4 L max for his outpatient paracenteses. He was not given a maximum today but did not to go over what we removed; therefore, the procedure was terminated.     Scheduled Meds: . docusate sodium  200 mg Oral BID  . heparin subcutaneous  5,000 Units Subcutaneous Q8H  . insulin aspart  0-9 Units Subcutaneous Q4H  . magnesium oxide  400 mg Oral BID  . morphine  30 mg Oral Q12H  . nystatin   Topical BID  . pantoprazole  40 mg Oral BID   Continuous Infusions: . albumin human    . cefTRIAXone (ROCEPHIN)  IV Stopped (05/16/17 2035)  . Marland KitchenTPN (CLINIMIX-E)  Adult     And  . fat emulsion    . Marland KitchenTPN (CLINIMIX-E) Adult 75 mL/hr at 05/16/17 1859     LOS: 1 day    Time spent: 25 minutes  Greater than 50% of the time spent on counseling and coordinating the care.   Leisa Lenz, MD [facility] Pager [phone]  If 7PM-7AM, please contact night-coverage [email or web address] Password [email or web address] 05/17/2017, 2:45 PM

## 2017-05-17 NOTE — Progress Notes (Addendum)
[place] NOTE   Pharmacy Consult for TPN Indication: Partial SBO, malnutrition  Patient Measurements: Height: 5\' 9"  (175.3 cm) Weight: 201 lb 4.5 oz (91.3 kg) IBW/kg (Calculated) : 70.7 TPN AdjBW (KG): 75.9 Body mass index is 29.72 kg/m. Usual Weight:   Insulin Requirements:  3 units sensitive SSI/24h  Current Nutrition: TPN at 82ml/hr providing 90g protein and lipids 235ml/day , 1758kcal per day  IVF: None  Central access: implanted PAC TPN start date: prior to admission on 5/23 Home TPN provided: 95gm protein and 1645 Kcal  ASSESSMENT                                                                                                          HPI: 56 yo male with mesothelioma of the abdominal cavity with peritoneal carcinomatosis with malignant ascites admitted with recurrent ascites and nausea vomiting and partial small bowel obstruction.  He was started on TPN at home for unintentional weight loss along with dehydration and hyponatremia; Pharmacy is consulted to continue managing upon admission.  Significant events:   Today:   Glucose - no hx DM, CBGs controlled  Electrolytes - Hyponatremia - stable/unchanged, Corr Ca (alb = 1.2) = 10.14, Mag WNL following replacement (on Mag-Ox), Phos WNL  Renal - SCr elevated but improving  LFTs - wnl, albumin low  TGs - drawn 5/28  Prealbumin - drawn 5/28  NUTRITIONAL GOALS                                                                                             RD recs: pending Clinimix E/15 at a goal rate of 36ml/hr over 24hr + 20% fat emulsion at 30ml/hr over 12hr to provide: 90g/day protein, 1758Kcal/day. This is the best match to home formula.  PLAN                                                                                                                         At 1800 today:  Continue hospital formulation of Clinimix E 5/15 at 56ml/hr.  20% fat emulsion at 42ml/hr x 12  hrs  TPN to contain standard multivitamins daily and trace  elements MWF.  SSI q4h sensitive scale - reduce # of checks as tolerates  TPN lab panels on Mondays & Thursdays.  BMP and Magnesium 5/29a  F/u daily.  Doreene Eland, PharmD, BCPS.   Pager: [phone] 05/17/2017 7:39 AM

## 2017-05-17 NOTE — Progress Notes (Signed)
Initial Nutrition Assessment  DOCUMENTATION CODES:   Non-severe (moderate) malnutrition in context of chronic illness  INTERVENTION:  - TPN per Pharmacy. - Will monitor for ability to advance diet. - RD will continue to monitor for POC/GOC and associated needs.  NUTRITION DIAGNOSIS:   Malnutrition (moderate/non-severe) related to chronic illness, nausea, vomiting (cancer) as evidenced by moderate depletions of muscle mass, moderate depletion of body fat.  GOAL:   Patient will meet greater than or equal to 90% of their needs  MONITOR:   Diet advancement, Weight trends, Labs, I & O's, Other (Comment) (TPN regimen)  REASON FOR ASSESSMENT:   Malnutrition Screening Tool, Consult New TPN/TNA  ASSESSMENT:   56 yo M with peritoneal carcinomatosis. He has been having worsening of his symptoms over the past month or so. Was recently discharged yesterday from the hospital after having large volume ascites and intractable nausea and vomiting. After paracentesis these symptoms seem to have resolved. He is able have a large meal last night. Today he's been unable to keep anything down. Usually stays in his stomach for about 2 hours and gets regurgitated. Having severe diffuse abdominal pain with this. He is unable to tolerate any of his medicines at home because he throws them all up.  Pt seen for MST and consult for new TPN. BMI indicates overweight/borderline obesity. Pt has been NPO since admission. He was recently admitted and d/c'ed 2 days ago (on 5/26); he was not seen by an RD during that admission and was last seen by an RD on 04/08/17. Pt drowsy during admission so discussion was fairly brief and no family/visitors present at this time to aid pt in providing information so that he could rest. Informed pt that RD had reviewed notes from previous admission and occurrences since d/c. He states that abdominal pain and nausea are slightly resolved this AM and that he has not had any episodes of  emesis today. He confirms recent hospitalization and d/c and that he was initially able to eat without much issue following paracentesis (done on 5/25 with removal of 5.4L) but that on day of admission he was unable d/t N/V and abdominal pain and distention. Notes 5/27 at 1444 and 1643 state that pt was on TPN during previous admission. Pt states that this is inaccurate and that there was discussion of starting TPN at that time but it was not initiated until this admission (initiated last night).  Pt with R chest port which is being used for TPN. He is currently receiving Clinimix E 5/15 @ 75 mL/hr with 20% ILE @ 20 mL/hr x12 hours which is providing 90 grams of protein (75% minimum estimated kcal need) and 1758 kcal (74% estimated kcal need). Hopeful for diet advancement so that high volume of TPN can be avoided for pt with recent paracentesis and ongoing abdominal distention.  Physical assessment shows moderate muscle and fat wasting to upper body; mild to moderate edema also noted. Per chart review, weight has been fluctuating (192-206 lbs) since 03/25/17. Will monitor weight trends closely.   Medications reviewed; 200 mg Colace BID, sliding scale Novolog, 400 mg Mag-ox BID, 2 g IV Mg sulfate x1 run yesterday, 40 mg oral Protonix BID, Labs reviewed; CBGs: 109, 126, and 138 mg/dL today, Na: 127 mmol/L, Cl: 95 mmol/L, creatinine: 1.27 mg/dL, Ca: 7.9 mg/dL.   Diet Order:  Diet NPO time specified Except for: Sips with Meds TPN (CLINIMIX-E) Adult TPN (CLINIMIX-E) Adult  Skin:  Reviewed, no issues  Last BM:  5/26  Height:   Ht Readings from Last 1 Encounters:  05/16/17 5\' 9"  (1.753 m)    Weight:   Wt Readings from Last 1 Encounters:  05/16/17 201 lb 4.5 oz (91.3 kg)    Ideal Body Weight:  72.73 kg  BMI:  Body mass index is 29.72 kg/m.  Estimated Nutritional Needs:   Kcal:  5537-4827 (26-28 kcal/kg)  Protein:  120-130 grams (~1.3-1.4 grams/kg)  Fluid:  Per MD given edema with  recent paracentesis  EDUCATION NEEDS:   No education needs identified at this time    Jarome Matin, MS, RD, LDN, [facility] Clinical Dietitian Pager # [phone] After hours/weekend pager # [phone]

## 2017-05-17 NOTE — Progress Notes (Signed)
IP PROGRESS NOTE  Subjective:   Frank Tucker is well-known to me with a history of peritoneal mesothelioma. He was admitted on 05/14/2017 with abdominal pain. He underwent a paracentesis on 05/15/2017 and was discharged home with Dilaudid. He reports Dilaudid did not control his pain and he presented back to the emergency room for admission. He continues to have abdominal pain and nausea. He denies emesis. He is passing flatus, but has not had a bowel movement.  He was started on home TNA last week.  Objective: Vital signs in last 24 hours: Blood pressure 106/63, pulse (!) 107, temperature 98.4 F (36.9 C), temperature source Oral, resp. rate 16, height 5\' 9"  (1.753 m), weight 201 lb 4.5 oz (91.3 kg), SpO2 97 %.  Intake/Output from previous day: No intake/output data recorded.  Physical Exam:  HEENT: No thrush or ulcers Lungs: Decreased breath sounds with rales at the lower posterior chest bilaterally, no respiratory distress Cardiac: Regular rate and rhythm Abdomen: Distended with ascites Extremities: Pitting edema at the right greater than left lower leg   Portacath/PICC-without erythema  Lab Results:  Recent Labs  05/15/17 0542 05/16/17 1330  WBC 14.6* 17.9*  HGB 8.9* 8.9*  HCT 27.4* 27.5*  PLT 684* 587*    BMET  Recent Labs  05/15/17 0542 05/16/17 1330  NA 128* 127*  K 4.7 4.9  CL 94* 93*  CO2 24 22  GLUCOSE 121* 126*  BUN 22* 18  CREATININE 1.64* 1.49*  CALCIUM 8.5* 8.1*    Studies/Results: Ct Abdomen Pelvis W Contrast  Result Date: 05/16/2017 CLINICAL DATA:  Increased abdominal pain and nausea. History of stomach cancer, ascites tapped on Friday. Tachypnea. Slight jaundice. EXAM: CT ABDOMEN AND PELVIS WITH CONTRAST TECHNIQUE: Multidetector CT imaging of the abdomen and pelvis was performed using the standard protocol following bolus administration of intravenous contrast. CONTRAST:  1107mL ISOVUE-300 IOPAMIDOL (ISOVUE-300) INJECTION 61% COMPARISON:  CT  abdomen dated 05/14/2017 FINDINGS: Lower chest: Small bilateral pleural effusions with adjacent atelectasis. Fluid/debris within the mildly distended lower esophagus. Hepatobiliary: No focal liver abnormality is seen. Gallbladder is mildly distended but otherwise unremarkable. No bile duct dilatation seen. Pancreas: Unremarkable. No pancreatic ductal dilatation or surrounding inflammatory changes. Spleen: Normal in size without focal abnormality. Adrenals/Urinary Tract: Adrenal glands appear normal. Kidneys are unremarkable without mass, stone or hydronephrosis. No ureteral or bladder calculi identified. Bladder is unremarkable. Stomach/Bowel: Bowel is normal in caliber. Stomach is moderately distended with fluid. Walls of the proximal small bowel appear thickened, likely reactive to the surrounding ascites. The more proximal duodenum is filled with fluid but not significantly dilated. Vascular/Lymphatic: No significant vascular findings are present. No enlarged abdominal or pelvic lymph nodes. Reproductive: Prostate is unremarkable. Other: Large volume ascites throughout the abdomen and pelvis. Again noted is the extensive omental thickening/carcinomatosis. No free intraperitoneal air. Musculoskeletal: No acute or suspicious osseous finding. IMPRESSION: 1. Large volume ascites throughout the abdomen and pelvis. 2. Diffuse soft tissue density omental thickening and nodularity, consistent with patient's known carcinomatosis. 3. Majority of the small bowel has thickened walls, most prominently seen within the jejunum, likely reactive to the surrounding ascites and/or involvement by carcinomatosis. Stomach and proximal duodenum are fluid-filled but not significantly distended, and the lower esophagus is mildly distended with fluid, perhaps indicating a mild partial obstruction related to the small bowel wall thickening. The fluid within the esophagus may predispose the patient to aspiration. 4. Small bilateral pleural  effusions. Electronically Signed   By: Franki Cabot M.D.   On:  05/16/2017 15:22   US Paracentesis  Result Date: 05/15/2017 INDICATION: Patient with mesothelioma of the abdominal cavity and peritoneal carcinomatosis with recurrent abdominal ascites. Request is made for therapeutic paracentesis. EXAM: ULTRASOUND GUIDED THERAPEUTIC PARACENTESIS MEDICATIONS: 1% lidocaine COMPLICATIONS: None immediate. PROCEDURE: Informed written consent was obtained from the patient after a discussion of the risks, benefits and alternatives to treatment. A timeout was performed prior to the initiation of the procedure. Initial ultrasound scanning demonstrates a large amount of ascites within the right lower abdominal quadrant. The right lower abdomen was prepped and draped in the usual sterile fashion. 1% lidocaine was used for local anesthesia. Following this, a 19 gauge, 7-cm, Yueh catheter was introduced. An ultrasound image was saved for documentation purposes. The paracentesis was performed. The catheter was removed and a dressing was applied. The patient tolerated the procedure well without immediate post procedural complication. FINDINGS: A total of approximately 5.4 L of serous fluid was removed. IMPRESSION: Successful ultrasound-guided paracentesis yielding 5.4 liters of peritoneal fluid. The patient normally has a 4 L max for his outpatient paracenteses. He was not given a maximum today but did not to go over what we removed; therefore, the procedure was terminated. Read by: Saverio Danker, PA-C Electronically Signed   By: Sandi Mariscal M.D.   On: 05/15/2017 10:43    Medications: I have reviewed the patient's current medications.  Assessment/Plan: 1. Abdominal carcinomatosis-primary peritoneal mesothelioma ? Paracentesis on 12/17/2016 confirmed malignant cells consistent with carcinoma, cytokeratin and WT-1 positive ? CTs of the chest, abdomen, and pelvis with ascites and omental caking. No other evidence of  metastatic disease and no primary tumor site identified ? Upper endoscopy 12/29/2016-negative for malignancy ? PET scan 01/01/2017 with extensive hypermetabolic omental caking of tumor with extensive ascites, no primary tumor site identified ? 01/08/2017 omental mass biopsy-malignant mesothelioma ? Cycle 1 cisplatin/Alimta 01/21/2017 ? Cycle 2 cisplatin/Alimta 02/11/2017 ? Cycle 3 cisplatin/Alimta 03/04/2017 ? Cycle 4 cisplatin/Alimta 03/25/2017 ? CT 04/01/2017-persistent ascites and omental thickening/peritoneal nodularity-Slightly improved, new small bowel obstruction   2. History of Yeast rash in the axilla and groin.  3. Port-A-Cath placement 01/20/2017  4. Delayed nauseasecondary to chemotherapy-persistent following cycle 2 despite Decadron prophylaxis, improved with cycles 3 with the addition of Reglan  5. Malignant ascites. He undergoes palliative paracentesis procedures as needed, last on 05/15/2017  6. Elevated creatinine-likely secondary to dehydration versus toxicity from cisplatin                   Renal ultrasound 05/10/2017-no hydronephrosis  7. Neutropenia secondary to chemotherapy-resolved  8. 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 Admission 03/31/2017 with a partial small bowel obstruction-improved with bowel rest  10. Syncope event at the Cancer center 04/30/2017 secondary to dehydration  11. Abdominal x-ray 05/03/2017 compatible with mid to distal small bowel ileus or partial small bowel obstruction.          12.  Abdominal pain secondary to peritoneal mesothelioma and partial bowel obstruction  Frank Tucker is admitted with progressive abdominal pain and nausea. He has clinical evidence of a partial bowel obstruction versus ileus. He has a poor prognosis. He decided against debulking surgery/intraperitoneal  chemotherapy after discussions with Dr.Votanopoulos. He was started on home TPN last week with the hope of allowing him to improve his nutritional status and potentially be able to undergo surgery in the future.  His clinical status has declined over the past several days. I discussed Hospice care with him. We can evaluate his status over  the next few days and decide on hospice care versus continuing TPN/comfort measures at home. It is difficult to balance his hydration/ needs and the progressive ascites.  Recommendations: 1. Agree with palliative care consult 2. Continue narcotic analgesics for pain. I will decrease the MS Contin dose as he has not required high-dose narcotics as an outpatient. Add oral Dilaudid or increased dose of oxycodone for breakthrough pain when taking by mouth 3. Advanced to liquid diet beginning 05/17/2017 if he is not vomiting 4. Continue TPN    LOS: 1 day   Betsy Coder, MD   05/17/2017, 7:33 AM

## 2017-05-17 NOTE — Consult Note (Signed)
Consultation Note Date: 05/17/2017   Patient Name: Frank Tucker  DOB: 1960/12/28  MRN: [id number]  Age / Sex: 56 y.o., male  PCP: Patient, No Pcp Per Referring Physician: Robbie Lis, MD  Reason for Consultation: Establishing goals of care  HPI/Patient Profile: 56 y.o. 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 Workup reveals partial small bowel obstruction. This is a readmission after being discharged home on 5/27 for hospitalization for malignant ascites and hyponatremia during which he was started on IV TPN. TPN was started in hopes of improving performance status so that he could possibly receive salvage chemotherapy in the future. Patient was seen by Dr. Benay Spice on 5/28 and option of Hospice was also discussed. Palliative medicine consulted for [name].   Clinical Assessment and Goals of Care: Met with patient at bedside. Pain is currently controlled, but returns before breakthrough pain medication is due. Pain is severe, in his abdomen, resolves with administration of IV hydromorphone. Had some nausea with administration of po medications earlier. He notes the lorazepam helps him with the nausea the most.  Patient is married. Has two kids aged 104-20. He is aware of his poor prognosis. We discussed the different options of continued aggressive care vs focus on comfort, quality and Hospice. His current GOC are to prolong his life until he can arrange his finances into a state that is satisfactory for his wife and kids. He worries about his wife who is a severe alcoholic and won't be able to care for herself. He has some support from friends and coworkers.  He is continuing to work as much as he can.  He states he does not want to suffer and live in pain. His mother had brain cancer and he regrets some of the decisions he made at the end of her life. He is open to discussing Hospice. Answered his questions regarding Hospice philosophy and services provided.   Primary Decision Maker PATIENT    SUMMARY OF RECOMMENDATIONS -Change prn hydromorphone to q1hr prn to better gauge patient's pain needs- then can redose pain medication tomorrow based on 24 hr dose requirements -Increase lorazepam .71m IV to q4hr prn as patient states this helps him with nausea and pain- recommend trying this as premed before po meds are given  -PMT will continue to follow and discuss [place] patient   Code Status/Advance Care Planning:  DNR    Symptom Management:   As above  Palliative Prophylaxis:   Frequent Pain Assessment  Additional Recommendations (Limitations, Scope, Preferences):  No Surgical Procedures  Psycho-social/Spiritual:   Desire for further Chaplaincy support:no  Additional Recommendations: Education on Hospice  Prognosis:    < 3 months  Discharge Planning: To Be Determined  Primary Diagnoses: Present on Admission: . Peritoneal carcinomatosis ([place] . Chemotherapy induced neutropenia ([place] . Hyponatremia . Dehydration . 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 Nausea with vomiting   I have reviewed the medical record, interviewed the patient and family, and examined the patient. The following aspects are pertinent.  Past Medical History:  Diagnosis Date  . Ascites   . 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 Peritoneal carcinomatosis [facility])    Social History   Social History  . Marital status: Married    Spouse name: N/A  . Number of children: N/A  . Years of education: N/A   Social History Main Topics  . Smoking status: Never Smoker  . Smokeless tobacco: Never Used  . Alcohol use No  . Drug use: No  . Sexual  activity: Not Asked   Other Topics Concern  . None   Social History Narrative   Married,   Has #2 Ezequiel Essex and daughter (19/18)   Family History  Problem Relation Age of Onset  . Brain cancer Mother   . CAD Father   . Alzheimer's disease Father    Scheduled Meds: . docusate sodium  200 mg Oral BID  . heparin subcutaneous  5,000 Units Subcutaneous Q8H  . insulin aspart  0-9 Units Subcutaneous Q4H  . magnesium oxide  400 mg Oral BID  . morphine  30 mg Oral Q12H  . nystatin   Topical BID  . pantoprazole  40 mg Oral BID   Continuous Infusions: . albumin human    . cefTRIAXone (ROCEPHIN)  IV Stopped (05/16/17 2035)  . Marland KitchenTPN (CLINIMIX-E) Adult     And  . fat emulsion    . Marland KitchenTPN (CLINIMIX-E) Adult 75 mL/hr at 05/16/17 1859   PRN Meds:.acetaminophen, albumin human, albuterol, bisacodyl, HYDROmorphone (DILAUDID) injection, LORazepam, LORazepam, ondansetron (ZOFRAN) IV, promethazine, sodium chloride flush Medications Prior to Admission:  Prior to Admission medications   Medication Sig Start Date End Date Taking? Authorizing Provider  ADULT TPN Inject into the vein continuous. Nutritional TPN, [facility] in [place], Vit 1 & 2 infused in 2000 ml bag, goes continuously for 24 hours   Yes [provider]  bisacodyl (DULCOLAX) 10 MG suppository Place 1 suppository (10 mg total) rectally every 12 (twelve) hours as needed for mild constipation or moderate constipation. 04/08/17  Yes Barton Dubois, MD  feeding supplement, ENSURE ENLIVE, (ENSURE ENLIVE) LIQD Take 237 mLs by mouth 2 (two) times daily between meals.   Yes [provider]  HYDROmorphone (DILAUDID) 2 MG tablet Take 1 tablet (2 mg total) by mouth every 4 (four) hours as needed for severe pain. 05/15/17  Yes Robbie Lis, MD  LORazepam (ATIVAN) 1 MG tablet Take 1 tablet (1 mg total) by mouth every 6 (six) hours as needed (nausea). DO NOT DRIVE 7/41/28  Yes Ladell Pier, MD  nystatin (MYCOSTATIN/NYSTOP)  powder Apply topically 2 (two) times daily. To under arm rash 03/04/17  Yes Ladell Pier, MD  ondansetron (ZOFRAN) 8 MG tablet Take 1 tablet (8 mg total) by mouth every 8 (eight) hours as needed for nausea or vomiting. Begin 3 days after chemo. 05/11/17  Yes Ladell Pier, MD  oxyCODONE-acetaminophen (PERCOCET/ROXICET) 5-325 MG tablet Take 1-2 tablets by mouth every 4 (four) hours as needed for severe pain.   Yes [provider]  pantoprazole (PROTONIX) 40 MG tablet Take 1 tablet (40 mg total) by mouth 2 (two) times daily. 04/08/17  Yes Barton Dubois, MD  polyethylene glycol [facility] / GLYCOLAX) packet Take 17 g by mouth daily. 04/09/17  Yes Barton Dubois, MD  acetaminophen (TYLENOL) 325 MG tablet  Take 2 tablets (650 mg total) by mouth every 6 (six) hours as needed for mild pain or headache (or Fever >/= 101). Patient not taking: Reported on 05/16/2017 04/08/17   Barton Dubois, MD  dexamethasone (DECADRON) 4 MG tablet Take 1 tablet (4 mg total) by mouth 2 (two) times daily. For 3 days. Begin day after chemo. Patient not taking: Reported on 05/16/2017 03/22/17   Ladell Pier, MD  feeding supplement (BOOST HIGH PROTEIN) LIQD Take 237 mLs by mouth 3 (three) times daily between meals. Patient not taking: Reported on 05/16/2017 04/08/17   Barton Dubois, MD  folic acid (FOLVITE) 1 MG tablet Take 1 tablet (1 mg total) by mouth daily. Patient not taking: Reported on 05/16/2017 01/12/17   Owens Shark, NP  magnesium oxide (MAG-OX) 400 MG tablet Take 1 tablet (400 mg total) by mouth 2 (two) times daily. Patient not taking: Reported on 05/16/2017 04/26/17   Ladell Pier, MD  metoCLOPramide (REGLAN) 10 MG tablet Take 1 tablet (10 mg total) by mouth 3 (three) times daily before meals. DO NOT TAKE COMPAZINE. Patient not taking: Reported on 05/14/2017 03/18/17   Ladell Pier, MD   No Known Allergies Review of Systems  Physical Exam  Vital Signs: BP 106/63 (BP Location: Left Arm)   Pulse (!)  107   Temp 98.4 F (36.9 C) (Oral)   Resp 16   Ht _0  (1.753 m)   Wt 91.3 kg (201 lb 4.5 oz)   SpO2 97%   BMI 29.72 kg/m  Pain Assessment: 0-10 POSS *See Group Information*: 1-Acceptable,Awake and alert Pain Score: Asleep   SpO2: SpO2: 97 % O2 Device:SpO2: 97 % O2 Flow Rate: .   IO: Intake/output summary: No intake or output data in the 24 hours ending 05/17/17 1401  LBM: Last BM Date: 05/15/17 Baseline Weight: Weight: 90.7 kg (200 lb) Most recent weight: Weight: 91.3 kg (201 lb 4.5 oz)     Palliative Assessment/Data: PPS: 20%     Thank you for this consult. Palliative medicine will continue to follow and assist as needed.   Time In: 1300 Time Out: 1415 Time Total: 75 minutes Greater than 50%  of this time was spent counseling and coordinating care related to the above assessment and plan.  Signed by: Mariana Kaufman, AGNP-C Palliative Medicine    Please contact Palliative Medicine Team phone at [phone] for questions and concerns.  For individual provider: See Shea Evans

## 2017-05-18 ENCOUNTER — Ambulatory Visit: Payer: PRIVATE HEALTH INSURANCE | Admitting: Oncology

## 2017-05-18 ENCOUNTER — Inpatient Hospital Stay (HOSPITAL_COMMUNITY): Payer: PRIVATE HEALTH INSURANCE

## 2017-05-18 DIAGNOSIS — E43 Unspecified severe protein-calorie malnutrition: Secondary | ICD-10-CM

## 2017-05-18 DIAGNOSIS — D638 Anemia in other chronic diseases classified elsewhere: Secondary | ICD-10-CM

## 2017-05-18 LAB — BODY FLUID CELL COUNT WITH DIFFERENTIAL
Lymphs, Fluid: 3 %
Monocyte-Macrophage-Serous Fluid: 3 % — ABNORMAL LOW (ref 50–90)
Neutrophil Count, Fluid: 94 % — ABNORMAL HIGH (ref 0–25)
Total Nucleated Cell Count, Fluid: 10230 cu mm — ABNORMAL HIGH (ref 0–1000)

## 2017-05-18 LAB — URINE CULTURE: Culture: <10000 — AB

## 2017-05-18 LAB — MAGNESIUM: Magnesium: 2 mg/dL (ref 1.7–2.4)

## 2017-05-18 LAB — BASIC METABOLIC PANEL
Anion gap: 8 (ref 5–15)
BUN: 31 mg/dL — ABNORMAL HIGH (ref 6–20)
CHLORIDE: 93 mmol/L — AB (ref 101–111)
CO2: 23 mmol/L (ref 22–32)
CREATININE: 1.78 mg/dL — AB (ref 0.61–1.24)
Calcium: 7.7 mg/dL — ABNORMAL LOW (ref 8.9–10.3)
GFR calc Af Amer: 48 mL/min — ABNORMAL LOW (ref >60)
GFR calc non Af Amer: 42 mL/min — ABNORMAL LOW (ref >60)
Glucose, Bld: 120 mg/dL — ABNORMAL HIGH (ref 65–99)
Potassium: 4.9 mmol/L (ref 3.5–5.1)
Sodium: 124 mmol/L — ABNORMAL LOW (ref 135–145)

## 2017-05-18 LAB — GLUCOSE, CAPILLARY
GLUCOSE-CAPILLARY: 108 mg/dL — AB (ref 65–99)
GLUCOSE-CAPILLARY: 115 mg/dL — AB (ref 65–99)
Glucose-Capillary: 117 mg/dL — ABNORMAL HIGH (ref 65–99)
Glucose-Capillary: 118 mg/dL — ABNORMAL HIGH (ref 65–99)
Glucose-Capillary: 124 mg/dL — ABNORMAL HIGH (ref 65–99)
Glucose-Capillary: 109 mg/dL — ABNORMAL HIGH (ref 65–99)

## 2017-05-18 MED ORDER — POLYETHYLENE GLYCOL 3350 17 G PO PACK
17.0000 g | PACK | Freq: Every day | ORAL | Status: DC
  Administered 2017-05-18 – 2017-05-20 (×3): 17 g via ORAL
  Filled 2017-05-18 (×3): qty 1

## 2017-05-18 MED ORDER — HYDROMORPHONE HCL 4 MG/ML IJ SOLN
1.0000 mg | INTRAMUSCULAR | Status: DC | PRN
  Administered 2017-05-18 – 2017-05-19 (×3): 1 mg via INTRAVENOUS
  Filled 2017-05-18 (×3): qty 1

## 2017-05-18 MED ORDER — FAT EMULSION 20 % IV EMUL
240.0000 mL | INTRAVENOUS | Status: AC
  Administered 2017-05-18: 240 mL via INTRAVENOUS
  Filled 2017-05-18: qty 250

## 2017-05-18 MED ORDER — SODIUM CHLORIDE 4 MEQ/ML IV SOLN
INTRAVENOUS | Status: AC
  Administered 2017-05-18: 18:00:00 via INTRAVENOUS
  Filled 2017-05-18: qty 1992

## 2017-05-18 MED ORDER — HYDROMORPHONE HCL 4 MG PO TABS
4.0000 mg | ORAL_TABLET | ORAL | Status: DC | PRN
  Administered 2017-05-18 – 2017-05-19 (×3): 4 mg via ORAL
  Administered 2017-05-19 – 2017-05-20 (×4): 8 mg via ORAL
  Filled 2017-05-18 (×2): qty 2
  Filled 2017-05-18 (×3): qty 1
  Filled 2017-05-18 (×2): qty 2

## 2017-05-18 NOTE — Progress Notes (Signed)
No charge note:   Patient off unit for paracentesis.   Per chart review and per discussion with RN patient's nausea and pain are controlled with current regimen.  If pain and nausea recur would recommend start dexamethasone 10mg  loading dose IV and contine with 4mg  daily. Also consider octreotide 34mcg/hr continuous infusion and titrate for effect and tolerance. This could be added to patient's TNA at home.   Noted patient preference for home with Hospcie if TNA can be continued. Patient discussed in rounds and some Hospice providers will accept patient with TNA. Recommend referral to case management for home Hospice choice.   Mariana Kaufman, AGNP-C Palliative Medicine  Please call Palliative Medicine team phone with any questions [phone]. For individual providers please see AMION.

## 2017-05-18 NOTE — Progress Notes (Signed)
IP PROGRESS NOTE  Subjective:   Mr. Marques reports improvement in the nausea and pain. No bowel movement. No further emesis. He feels dizzy when sitting or standing.   Objective: Vital signs in last 24 hours: Blood pressure 133/83, pulse 98, temperature 98 F (36.7 C), temperature source Oral, resp. rate 16, height 5\' 9"  (1.753 m), weight 201 lb 4.5 oz (91.3 kg), SpO2 93 %.  Intake/Output from previous day: 05/28 0701 - 05/29 0700 In: 900 [I.V.:900] Out: -   Physical Exam:  HEENT: No thrush or ulcers Lungs: Decreased breath sounds with rales at the lower posterior chest bilaterally, no respiratory distress Cardiac: Regular rate and rhythm Abdomen: Distended with ascites Extremities: Pitting edema at the right greater than left lower leg   Portacath/PICC-without erythema  Lab Results:  Recent Labs  05/16/17 1330 05/17/17 0841  WBC 17.9* 29.4*  HGB 8.9* 8.1*  HCT 27.5* 24.9*  PLT 587* 406*    BMET  Recent Labs  05/17/17 0841 05/18/17 0520  NA 127* 124*  K 4.3 4.9  CL 95* 93*  CO2 22 23  GLUCOSE 106* 120*  BUN 20 31*  CREATININE 1.27* 1.78*  CALCIUM 7.9* 7.7*    Studies/Results: Ct Abdomen Pelvis W Contrast  Result Date: 05/16/2017 CLINICAL DATA:  Increased abdominal pain and nausea. History of stomach cancer, ascites tapped on Friday. Tachypnea. Slight jaundice. EXAM: CT ABDOMEN AND PELVIS WITH CONTRAST TECHNIQUE: Multidetector CT imaging of the abdomen and pelvis was performed using the standard protocol following bolus administration of intravenous contrast. CONTRAST:  19mL ISOVUE-300 IOPAMIDOL (ISOVUE-300) INJECTION 61% COMPARISON:  CT abdomen dated 05/14/2017 FINDINGS: Lower chest: Small bilateral pleural effusions with adjacent atelectasis. Fluid/debris within the mildly distended lower esophagus. Hepatobiliary: No focal liver abnormality is seen. Gallbladder is mildly distended but otherwise unremarkable. No bile duct dilatation seen. Pancreas:  Unremarkable. No pancreatic ductal dilatation or surrounding inflammatory changes. Spleen: Normal in size without focal abnormality. Adrenals/Urinary Tract: Adrenal glands appear normal. Kidneys are unremarkable without mass, stone or hydronephrosis. No ureteral or bladder calculi identified. Bladder is unremarkable. Stomach/Bowel: Bowel is normal in caliber. Stomach is moderately distended with fluid. Walls of the proximal small bowel appear thickened, likely reactive to the surrounding ascites. The more proximal duodenum is filled with fluid but not significantly dilated. Vascular/Lymphatic: No significant vascular findings are present. No enlarged abdominal or pelvic lymph nodes. Reproductive: Prostate is unremarkable. Other: Large volume ascites throughout the abdomen and pelvis. Again noted is the extensive omental thickening/carcinomatosis. No free intraperitoneal air. Musculoskeletal: No acute or suspicious osseous finding. IMPRESSION: 1. Large volume ascites throughout the abdomen and pelvis. 2. Diffuse soft tissue density omental thickening and nodularity, consistent with patient's known carcinomatosis. 3. Majority of the small bowel has thickened walls, most prominently seen within the jejunum, likely reactive to the surrounding ascites and/or involvement by carcinomatosis. Stomach and proximal duodenum are fluid-filled but not significantly distended, and the lower esophagus is mildly distended with fluid, perhaps indicating a mild partial obstruction related to the small bowel wall thickening. The fluid within the esophagus may predispose the patient to aspiration. 4. Small bilateral pleural effusions. Electronically Signed   By: Franki Cabot M.D.   On: 05/16/2017 15:22    Medications: I have reviewed the patient's current medications.  Assessment/Plan: 1. Abdominal carcinomatosis-primary peritoneal mesothelioma ? Paracentesis on 12/17/2016 confirmed malignant cells consistent with carcinoma,  cytokeratin and WT-1 positive ? CTs of the chest, abdomen, and pelvis with ascites and omental caking. No other evidence of metastatic  disease and no primary tumor site identified ? Upper endoscopy 12/29/2016-negative for malignancy ? PET scan 01/01/2017 with extensive hypermetabolic omental caking of tumor with extensive ascites, no primary tumor site identified ? 01/08/2017 omental mass biopsy-malignant mesothelioma ? Cycle 1 cisplatin/Alimta 01/21/2017 ? Cycle 2 cisplatin/Alimta 02/11/2017 ? Cycle 3 cisplatin/Alimta 03/04/2017 ? Cycle 4 cisplatin/Alimta 03/25/2017 ? CT 04/01/2017-persistent ascites and omental thickening/peritoneal nodularity-Slightly improved, new small bowel obstruction   2. History of Yeast rash in the axilla and groin.  3. Port-A-Cath placement 01/20/2017  4. Delayed nauseasecondary to chemotherapy-persistent following cycle 2 despite Decadron prophylaxis, improved with cycles 3 with the addition of Reglan  5. Malignant ascites. He undergoes palliative paracentesis procedures as needed, last on 05/15/2017  6. Elevated creatinine-likely secondary to dehydration versus toxicity from cisplatin                   Renal ultrasound 05/10/2017-no hydronephrosis  7. Neutropenia secondary to chemotherapy-resolved  8. 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 Admission 03/31/2017 with a partial small bowel obstruction-improved with bowel rest  10. Syncope event at the Cancer center 04/30/2017 secondary to dehydration  11. Abdominal x-ray 05/03/2017 compatible with mid to distal small bowel ileus or partial small bowel obstruction.          12.  Abdominal pain secondary to peritoneal mesothelioma and partial bowel obstruction  Mr. Calzadilla reports improved pain and nausea compared to hospital admission. He would like to try a  liquid diet and MiraLAX. The IV Dilaudid helps his pain. He continues to have orthostatic symptoms and discomfort related to abdominal distention.  We discussed Hospice care. He will agree to a hospice referral if he can continue TPN. He will not enroll in a hospice program if he cannot continue TPN.  Recommendations: 1. Continue MS Contin, try an increased dose of oral Dilaudid for breakthrough pain 2. Liquid diet 3. MiraLAX 4. Continue TPN 5. Increase ambulation as tolerated 6. Paracentesis as needed for palliation of abdominal distention-limit paracentesis volume secondary to orthostasis 7. Consider discontinuing ceftriaxone, no clinical evidence of peritonitis    LOS: 2 days   Donneta Romberg, MD   05/18/2017, 9:33 AM

## 2017-05-18 NOTE — Progress Notes (Signed)
CM consult for home with hospice. This CM met with pt at bedside to offer choice for home hospice services. Pt request being able to keep TPN for a period of time at discharge. Pt also states that residential hospice might eventually be needed. This CM has began calling hospice agencies in [place] that will allow TPN at home and have residential facilities attached to them. CM will provide pt with information obtained. Marney Doctor RN,BSN,NCM [phone]

## 2017-05-18 NOTE — Progress Notes (Signed)
Patient ID: Frank Tucker, male   DOB: Aug 05, 1961, 56 y.o.   MRN: [id number]  PROGRESS NOTE    Frank Tucker  HYW:[id number] DOB: [birth year] DOA: 05/16/2017  PCP: Patient, No Pcp Per   Brief Narrative:  56 year old male with history of abdominal carcinomatosis-primary peritoneal mesothelioma, associated malignant ascites, just hospitalized 5/25 - 5/26 for abdominal distention, nausea and poor by mouth intake. He had paracentesis on 05/15/2017 with 5.4 L fluid removed and he went home hoping that symptoms would remain stable. He however came back because he developed progressive abdominal distention, nausea and poor by mouth intake.   Assessment & Plan:   Principal Problem:   Nausea with vomiting, peritoneal carcinomatosis ([place]) /mesothelioma of peritoneum ([place]) / Possible SBO - Nausea and vomiting likely in the setting of ascites from peritoneal carcinomatosis - Continue supportive care with analgesia as needed - Appreciate oncology following and their recommendations; oncology recommended hospice care and patient agreeable only if he can continue TPN  Active Problems: Hyponatremia - Secondary to malignancy, ascites - Sodium 124, concerning for hepatorenal syndrome Continue to monitor daily sodium level-   Leukocytosis - Increasing white blood cell count worrisome for SBP - Patient is on Rocephin - Plan for paracentesis today  Anemia of chronic disease - Secondary to chronic kidney disease and malignancy - Hemoglobin stable  CKD stage 3 - Creatinine 1.78 this morning, up from 1.27 yesterday - Possible hepatorenal syndrome - We'll continue to monitor daily BMP  Severe protein calorie malnutrition - In the context of chronic illness - Continue TPN   DVT prophylaxis: Subcutaneous heparin Code Status: DNR/DNI Family Communication: No family at the bedside Disposition Plan: Home once patient able to tolerate regular diet   Consultants:   Oncology, Dr. Betsy Coder   Palliative care, Dr. Micheline Rough   Nutrition  Procedures:   Plan for paracentesis today  Antimicrobials:   Rocephin 05/16/2017 -->   Subjective: Says he feels little bit better this morning.  Objective: Vitals:   05/17/17 1421 05/17/17 2040 05/17/17 2041 05/18/17 0458  BP: (!) 100/59 90/64 (!) 90/50 133/83  Pulse: 100 (!) 42 (!) 109 98  Resp: 18 14  16   Temp: 98.8 F (37.1 C) 97.6 F (36.4 C)  98 F (36.7 C)  TempSrc: Oral Oral  Oral  SpO2: 98% 96%  93%  Weight:      Height:        Intake/Output Summary (Last 24 hours) at 05/18/17 1145 Last data filed at 05/18/17 0700  Gross per 24 hour  Intake              900 ml  Output                0 ml  Net              900 ml   Filed Weights   05/16/17 1309 05/16/17 1818  Weight: 90.7 kg (200 lb) 91.3 kg (201 lb 4.5 oz)    Examination:  General exam: Appears calm and comfortable, no distress  Respiratory system: No wheezing, no rhonchi Cardiovascular system: S1 & S2 heard, rate controlled Gastrointestinal system: Appreciate bowel sounds, distended abdomen, positive fluid wave Central nervous system: No focal neurological deficits. Extremities: Symmetric 5 x 5 power. No tenderness Skin: No rashes, lesions or ulcers, skin is warm and dry Psychiatry: Normal mood and behavior  Data Reviewed: I have personally reviewed following labs and imaging studies  CBC:  Recent Labs Lab 05/14/17 1848  05/15/17 0542 05/16/17 1330 05/17/17 0841  WBC 12.5* 14.6* 17.9* 29.4*  NEUTROABS  --   --   --  27.3*  HGB 9.6* 8.9* 8.9* 8.1*  HCT 29.4* 27.4* 27.5* 24.9*  MCV 98.7 100.4* 98.9 98.4  PLT 714* 684* 587* 175*   Basic Metabolic Panel:  Recent Labs Lab 05/14/17 1848 05/15/17 0542 05/16/17 1330 05/17/17 0841 05/18/17 0520  NA 128* 128* 127* 127* 124*  K 4.6 4.7 4.9 4.3 4.9  CL 94* 94* 93* 95* 93*  CO2 22 24 22 22 23   GLUCOSE 119* 121* 126* 106* 120*  BUN 21* 22* 18 20 31*  CREATININE 1.73* 1.64*  1.49* 1.27* 1.78*  CALCIUM 8.4* 8.5* 8.1* 7.9* 7.7*  MG  --   --  1.6* 1.8 2.0  PHOS  --   --  3.5 3.8  --    GFR: Estimated Creatinine Clearance: 52.9 mL/min (A) (by C-G formula based on SCr of 1.78 mg/dL (H)). Liver Function Tests:  Recent Labs Lab 05/14/17 1848 05/16/17 1330 05/17/17 0841  AST 17 34 33  ALT 10* 10* 14*  ALKPHOS 114 100 107  BILITOT 0.3 0.7 0.1*  PROT 5.9* 5.7* 4.9*  ALBUMIN 1.6* 1.5* 1.2*    Recent Labs Lab 05/14/17 1848 05/16/17 1330  LIPASE 31 26   No results for input(s): AMMONIA in the last 168 hours. Coagulation Profile:  Recent Labs Lab 05/15/17 0542  INR 0.85   Cardiac Enzymes: No results for input(s): CKTOTAL, CKMB, CKMBINDEX, TROPONINI in the last 168 hours. BNP (last 3 results) No results for input(s): PROBNP in the last 8760 hours. HbA1C: No results for input(s): HGBA1C in the last 72 hours. CBG:  Recent Labs Lab 05/17/17 1642 05/17/17 1934 05/18/17 0018 05/18/17 0403 05/18/17 0757  GLUCAP 111* 104* 115* 117* 118*   Lipid Profile:  Recent Labs  05/17/17 0841  TRIG 107   Thyroid Function Tests: No results for input(s): TSH, T4TOTAL, FREET4, T3FREE, THYROIDAB in the last 72 hours. Anemia Panel: No results for input(s): VITAMINB12, FOLATE, FERRITIN, TIBC, IRON, RETICCTPCT in the last 72 hours. Urine analysis:    Component Value Date/Time   COLORURINE YELLOW 05/16/2017 [place] 05/16/2017 1314   LABSPEC 1.030 05/16/2017 1314   PHURINE 5.0 05/16/2017 1314   GLUCOSEU NEGATIVE 05/16/2017 1314   HGBUR NEGATIVE 05/16/2017 1314   [place] 05/16/2017 1314   KETONESUR NEGATIVE 05/16/2017 1314   PROTEINUR 30 (A) 05/16/2017 1314   NITRITE NEGATIVE 05/16/2017 1314   LEUKOCYTESUR NEGATIVE 05/16/2017 1314   Sepsis Labs: @LABRCNTIP (procalcitonin:4,lacticidven:4)   ) Recent Results (from the past 240 hour(s))  Culture, Urine     Status: Abnormal   Collection Time: 05/16/17  1:15 PM  Result  Value Ref Range Status   Specimen Description URINE, CLEAN CATCH  Final   Special Requests NONE  Final   Culture (A)  Final    <10,000 COLONIES/mL INSIGNIFICANT GROWTH Performed at [facility], [place] [address] [place], [place] [address]    Report Status 05/18/2017 FINAL  Final      Radiology Studies: Ct Abdomen Pelvis Wo Contrast  Result Date: 05/14/2017 CLINICAL DATA:  Acute onset of severe generalized abdominal pain, nausea and vomiting. Initial encounter. EXAM: CT ABDOMEN AND PELVIS WITHOUT CONTRAST TECHNIQUE: Multidetector CT imaging of the abdomen and pelvis was performed following the standard protocol without IV contrast. COMPARISON:  CT of the abdomen and pelvis performed 03/31/2017 and 12/16/2016 FINDINGS: Lower chest: Trace bilateral pleural effusions  are noted, left greater than right, with associated atelectasis. The visualized portions of the mediastinum are grossly unremarkable. There is apparent wall thickening at the distal esophagus, better characterized on coronal images. An underlying mass cannot be excluded. Endoscopy could be considered for further evaluation. Hepatobiliary: The liver is unremarkable in appearance. The gallbladder is unremarkable in appearance. The common bile duct remains normal in caliber. Pancreas: The pancreas is within normal limits. Spleen: The spleen is unremarkable in appearance. Adrenals/Urinary Tract: The adrenal glands are unremarkable in appearance. Nonspecific perinephric stranding is noted bilaterally. The kidneys are otherwise unremarkable in appearance. There is no evidence of hydronephrosis. No renal or ureteral stones are identified. Stomach/Bowel: The stomach is unremarkable in appearance. There is wall thickening along the visualized small bowel, raising question for infectious or inflammatory ileitis. The appendix is normal in caliber, without evidence of appendicitis. The patient's peritoneal carcinomatosis abuts the splenic flexure of  the colon. The colon is otherwise unremarkable in appearance. Vascular/Lymphatic: The abdominal aorta is unremarkable in appearance. The inferior vena cava is grossly unremarkable. No retroperitoneal lymphadenopathy is seen. No pelvic sidewall lymphadenopathy is identified. Reproductive: The bladder is mildly distended and grossly unremarkable. The prostate remains borderline normal in size. Other: Diffuse peritoneal carcinomatosis is noted within the abdomen and pelvis. Large volume ascites is noted throughout the abdomen and pelvis. Musculoskeletal: No acute osseous abnormalities are identified. The visualized musculature is unremarkable in appearance. IMPRESSION: 1. Diffuse peritoneal carcinomatosis within the abdomen and pelvis. 2. Large volume ascites noted throughout the abdomen and pelvis. 3. Peritoneal carcinomatosis abuts the splenic flexure of the colon. Colon otherwise unremarkable. 4. Wall thickening along the visualized small bowel, raising question for infectious or inflammatory ileitis. 5. Trace bilateral pleural effusions, left greater than right, with associated atelectasis. 6. Apparent wall thickening at the distal esophagus, better characterized on coronal images. This appears to be new from December. Underlying mass cannot be excluded. Endoscopy could be considered for further evaluation. Electronically Signed   By: Garald Balding M.D.   On: 05/14/2017 22:37   Ct Abdomen Pelvis W Contrast  Result Date: 05/16/2017 CLINICAL DATA:  Increased abdominal pain and nausea. History of stomach cancer, ascites tapped on Friday. Tachypnea. Slight jaundice. EXAM: CT ABDOMEN AND PELVIS WITH CONTRAST TECHNIQUE: Multidetector CT imaging of the abdomen and pelvis was performed using the standard protocol following bolus administration of intravenous contrast. CONTRAST:  143mL ISOVUE-300 IOPAMIDOL (ISOVUE-300) INJECTION 61% COMPARISON:  CT abdomen dated 05/14/2017 FINDINGS: Lower chest: Small bilateral pleural  effusions with adjacent atelectasis. Fluid/debris within the mildly distended lower esophagus. Hepatobiliary: No focal liver abnormality is seen. Gallbladder is mildly distended but otherwise unremarkable. No bile duct dilatation seen. Pancreas: Unremarkable. No pancreatic ductal dilatation or surrounding inflammatory changes. Spleen: Normal in size without focal abnormality. Adrenals/Urinary Tract: Adrenal glands appear normal. Kidneys are unremarkable without mass, stone or hydronephrosis. No ureteral or bladder calculi identified. Bladder is unremarkable. Stomach/Bowel: Bowel is normal in caliber. Stomach is moderately distended with fluid. Walls of the proximal small bowel appear thickened, likely reactive to the surrounding ascites. The more proximal duodenum is filled with fluid but not significantly dilated. Vascular/Lymphatic: No significant vascular findings are present. No enlarged abdominal or pelvic lymph nodes. Reproductive: Prostate is unremarkable. Other: Large volume ascites throughout the abdomen and pelvis. Again noted is the extensive omental thickening/carcinomatosis. No free intraperitoneal air. Musculoskeletal: No acute or suspicious osseous finding. IMPRESSION: 1. Large volume ascites throughout the abdomen and pelvis. 2. Diffuse soft tissue density omental thickening and  nodularity, consistent with patient's known carcinomatosis. 3. Majority of the small bowel has thickened walls, most prominently seen within the jejunum, likely reactive to the surrounding ascites and/or involvement by carcinomatosis. Stomach and proximal duodenum are fluid-filled but not significantly distended, and the lower esophagus is mildly distended with fluid, perhaps indicating a mild partial obstruction related to the small bowel wall thickening. The fluid within the esophagus may predispose the patient to aspiration. 4. Small bilateral pleural effusions. Electronically Signed   By: Franki Cabot M.D.   On:  05/16/2017 15:22   US Paracentesis  Result Date: 05/15/2017 INDICATION: Patient with mesothelioma of the abdominal cavity and peritoneal carcinomatosis with recurrent abdominal ascites. Request is made for therapeutic paracentesis. EXAM: ULTRASOUND GUIDED THERAPEUTIC PARACENTESIS MEDICATIONS: 1% lidocaine COMPLICATIONS: None immediate. PROCEDURE: Informed written consent was obtained from the patient after a discussion of the risks, benefits and alternatives to treatment. A timeout was performed prior to the initiation of the procedure. Initial ultrasound scanning demonstrates a large amount of ascites within the right lower abdominal quadrant. The right lower abdomen was prepped and draped in the usual sterile fashion. 1% lidocaine was used for local anesthesia. Following this, a 19 gauge, 7-cm, Yueh catheter was introduced. An ultrasound image was saved for documentation purposes. The paracentesis was performed. The catheter was removed and a dressing was applied. The patient tolerated the procedure well without immediate post procedural complication. FINDINGS: A total of approximately 5.4 L of serous fluid was removed. IMPRESSION: Successful ultrasound-guided paracentesis yielding 5.4 liters of peritoneal fluid. The patient normally has a 4 L max for his outpatient paracenteses. He was not given a maximum today but did not to go over what we removed; therefore, the procedure was terminated. Read by: Saverio Danker, PA-C Electronically Signed   By: Sandi Mariscal M.D.   On: 05/15/2017 10:43     Scheduled Meds: . docusate sodium  200 mg Oral BID  . heparin subcutaneous  5,000 Units Subcutaneous Q8H  . insulin aspart  0-9 Units Subcutaneous Q4H  . magnesium oxide  400 mg Oral BID  . morphine  30 mg Oral Q12H  . nystatin   Topical BID  . pantoprazole  40 mg Oral BID  . polyethylene glycol  17 g Oral Daily   Continuous Infusions: . albumin human    . cefTRIAXone (ROCEPHIN)  IV Stopped (05/17/17 2118)    . Marland KitchenTPN (CLINIMIX-E) Adult     And  . fat emulsion    . Marland KitchenTPN (CLINIMIX-E) Adult 75 mL/hr at 05/17/17 1741     LOS: 2 days    Time spent: 25 minutes  Greater than 50% of the time spent on counseling and coordinating the care.   Leisa Lenz, MD [facility] Pager [phone]  If 7PM-7AM, please contact night-coverage [email or web address] Password [email or web address] 05/18/2017, 11:45 AM

## 2017-05-18 NOTE — Progress Notes (Signed)
[facility]  Patient Status: Active  [facility] is providing the following services: SN, Pharmacy (TPN)   If patient discharges after hours, please call [phone].   [name] 05/18/2017, 9:43 AM

## 2017-05-18 NOTE — Progress Notes (Signed)
Frank Tucker NOTE   Pharmacy Consult for TPN Indication: Partial SBO, malnutrition  Patient Measurements: Height: 5\' 9"  (175.3 cm) Weight: 201 lb 4.5 oz (91.3 kg) IBW/kg (Calculated) : 70.7 TPN AdjBW (KG): 75.9 Body mass index is 29.72 kg/m. Usual Weight:   Insulin Requirements:  0 units sensitive SSI/24h  Current Nutrition: TPN at 87ml/hr providing 90g protein and lipids 285ml/day , 1758kcal per day  IVF: None  Central access: implanted PAC TPN start date: prior to admission on 5/23 Home TPN provided: 95gm protein and 1645 Kcal  ASSESSMENT                                                                                                          HPI: 56 yo male with mesothelioma of the abdominal cavity with peritoneal carcinomatosis with malignant ascites admitted with recurrent ascites and nausea vomiting and partial small bowel obstruction.  He was started on TPN at home for unintentional weight loss along with dehydration and hyponatremia; Pharmacy is consulted to continue managing upon admission.  Significant events:   Today:   Glucose - no hx DM, CBGs controlled  Electrolytes - continued hyponatremia, Corr Ca (alb = 1.2) = 9.94, Mag WNL (on Mag-Ox), Phos WNL yesterday.   Renal - SCr elevated  LFTs - wnl/low  TGs - 107 (5/28)  Prealbumin - 9.6 (5/28)  NUTRITIONAL GOALS                                                                                             RD recs: 120-130g protein (~1.3-1.4 g/kg), 5093-2671 kcal (26-27 kcal/g) Clinimix E 5/15 at a goal rate of 100 ml/hr over 24hr + 20% fat emulsion at 55ml/hr over 12hr to provide: 120g/day protein, 2184 Kcal/day.  PLAN                                                                                                                         At 1800 today:  Increase Clinimix E 5/15 to 83 ml/hr.  Plan to advance to 100 ml/hr to meet new nutrition goals; however, increasing  cautiously today with electrolyte-containing formulation considering K+ at upper  limit of normal and increased SCr.  Due to worsening hyponatremia, will increase sodium content in TPN to 0.9% NS.  20% fat emulsion at 79ml/hr x 12 hrs  TPN to contain standard multivitamins daily and trace elements MWF.  SSI q4h sensitive scale - reduce # of checks as tolerates  TPN lab panels on Mondays & Thursdays.  BMET tomorrow AM.  F/u daily.  Frank Tucker, PharmD, BCPS Pager: [phone] 05/18/2017 8:39 AM

## 2017-05-18 NOTE — Procedures (Signed)
Ultrasound-guided diagnostic and therapeutic paracentesis performed yielding 6 liters (maximum ordered) of gelatinous, light yellow fluid. No immediate complications. A portion of the fluid was submitted to the lab for preordered studies.

## 2017-05-19 DIAGNOSIS — Z515 Encounter for palliative care: Secondary | ICD-10-CM

## 2017-05-19 DIAGNOSIS — G43A0 Cyclical vomiting, not intractable: Secondary | ICD-10-CM

## 2017-05-19 LAB — GLUCOSE, CAPILLARY
GLUCOSE-CAPILLARY: 122 mg/dL — AB (ref 65–99)
GLUCOSE-CAPILLARY: 130 mg/dL — AB (ref 65–99)
GLUCOSE-CAPILLARY: 132 mg/dL — AB (ref 65–99)
GLUCOSE-CAPILLARY: 184 mg/dL — AB (ref 65–99)
Glucose-Capillary: 120 mg/dL — ABNORMAL HIGH (ref 65–99)
Glucose-Capillary: 117 mg/dL — ABNORMAL HIGH (ref 65–99)

## 2017-05-19 LAB — BASIC METABOLIC PANEL
Anion gap: 9 (ref 5–15)
BUN: 42 mg/dL — ABNORMAL HIGH (ref 6–20)
CALCIUM: 8 mg/dL — AB (ref 8.9–10.3)
CHLORIDE: 93 mmol/L — AB (ref 101–111)
CO2: 24 mmol/L (ref 22–32)
Creatinine, Ser: 2.19 mg/dL — ABNORMAL HIGH (ref 0.61–1.24)
GFR calc Af Amer: 37 mL/min — ABNORMAL LOW (ref >60)
GFR calc non Af Amer: 32 mL/min — ABNORMAL LOW (ref >60)
Glucose, Bld: 121 mg/dL — ABNORMAL HIGH (ref 65–99)
Potassium: 5 mmol/L (ref 3.5–5.1)
Sodium: 126 mmol/L — ABNORMAL LOW (ref 135–145)

## 2017-05-19 LAB — CBC
HCT: 23.8 % — ABNORMAL LOW (ref 39.0–52.0)
Hemoglobin: 7.7 g/dL — ABNORMAL LOW (ref 13.0–17.0)
MCH: 31.6 pg (ref 26.0–34.0)
MCHC: 32.4 g/dL (ref 30.0–36.0)
MCV: 97.5 fL (ref 78.0–100.0)
Platelets: 246 10*3/uL (ref 150–400)
RBC: 2.44 MIL/uL — ABNORMAL LOW (ref 4.22–5.81)
RDW: 14.7 % (ref 11.5–15.5)
WBC: 13.5 10*3/uL — ABNORMAL HIGH (ref 4.0–10.5)

## 2017-05-19 LAB — PATHOLOGIST SMEAR REVIEW

## 2017-05-19 MED ORDER — FAT EMULSION 20 % IV EMUL
240.0000 mL | INTRAVENOUS | Status: AC
  Administered 2017-05-19: 240 mL via INTRAVENOUS
  Filled 2017-05-19: qty 240

## 2017-05-19 MED ORDER — SODIUM CHLORIDE 0.9 % IV SOLN
25.0000 ug/h | INTRAVENOUS | Status: DC
  Administered 2017-05-19 – 2017-05-20 (×2): 25 ug/h via INTRAVENOUS
  Filled 2017-05-19 (×3): qty 1

## 2017-05-19 MED ORDER — DEXAMETHASONE SODIUM PHOSPHATE 10 MG/ML IJ SOLN
10.0000 mg | Freq: Once | INTRAMUSCULAR | Status: AC
  Administered 2017-05-19: 10 mg via INTRAVENOUS
  Filled 2017-05-19: qty 1

## 2017-05-19 MED ORDER — DEXAMETHASONE SODIUM PHOSPHATE 4 MG/ML IJ SOLN
4.0000 mg | Freq: Every day | INTRAMUSCULAR | Status: DC
  Administered 2017-05-20: 4 mg via INTRAVENOUS
  Filled 2017-05-19: qty 1

## 2017-05-19 MED ORDER — SODIUM CHLORIDE 0.9 % IV SOLN
INTRAVENOUS | Status: AC
  Administered 2017-05-19: 15:00:00 via INTRAVENOUS

## 2017-05-19 MED ORDER — SENNA 8.6 MG PO TABS
2.0000 | ORAL_TABLET | Freq: Two times a day (BID) | ORAL | Status: DC
  Administered 2017-05-19 – 2017-05-20 (×3): 17.2 mg via ORAL
  Filled 2017-05-19 (×4): qty 2

## 2017-05-19 MED ORDER — TRACE MINERALS CR-CU-MN-SE-ZN 10-1000-500-60 MCG/ML IV SOLN
INTRAVENOUS | Status: AC
  Administered 2017-05-19: 18:00:00 via INTRAVENOUS
  Filled 2017-05-19: qty 1992

## 2017-05-19 NOTE — Progress Notes (Signed)
[facility] to meet with pt at bedside at 12:30 today to see if they can meet pt needs for home.  Marney Doctor RN,BSN,NCM [phone]

## 2017-05-19 NOTE — Progress Notes (Signed)
Per liaison for [facility], his case needs to be discussed with their medical director to approve TPN. Pt still requiring IV pain medication this afternoon. Will await home hospice decision. Marney Doctor RN,BSN,NCM [phone]

## 2017-05-19 NOTE — Progress Notes (Signed)
PROGRESS NOTE  Frank Tucker OEU:[id number] DOB: [birth year] DOA: 05/16/2017 PCP: Patient, No Pcp Per   LOS: 3 days   Brief Narrative / Interim history: 56 year old male with history of abdominal carcinomatosis-primary peritoneal mesothelioma, associated malignant ascites, just hospitalized 5/25 - 5/26 for abdominal distention, nausea and poor by mouth intake. He had paracentesis on 05/15/2017 with 5.4 L fluid removed and he went home hoping that symptoms would remain stable. He however came back because he developed progressive abdominal distention, nausea and poor by mouth intake.  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  Patient will be going home with hospice likely tomorrow  Acute on CKD III -Creatinine in 2018 has been ranging between 1.5 and 2.1, may be related to recurrent paracentesis and fluid shift with intermittent hypotension, creatinine worse today, will give gentle hydration overnight.  Hyponatremia -Due to malignancy/ascites, overall stable  Leukocytosis -Concern for secondary bacterial peritonitis due to increased white cell count, continue Rocephin, will likely need Cipro on discharge.  Status post paracentesis with 6 L removed on 5/29.  Anemia of chronic disease -Secondary to chronic kidney disease and malignancy -Hemoglobin  stable  Severe protein calorie malnutrition -In the context of chronic illness -Continue TPN   DVT prophylaxis: heparin Code Status: DNR Family Communication: friend in the room Disposition Plan: home with hospice tomorrow  Consultants:   Oncology  Palliative care  Procedures:   Paracentesis   Antimicrobials:  Ceftriaxone 5/27 >>   Subjective: -initially felt better, however in the afternoon his pain is increased and had more nausea requiring IV medications  Objective: Vitals:   05/18/17 1446 05/18/17 1650 05/18/17 2101 05/19/17 0455  BP: 98/62 (!) 90/49 (!) 94/53 (!) 97/57  Pulse:  99 (!) 108 (!) 102  Resp:  17 16 16   Temp:  98.7 F (37.1 C) 98 F (36.7 C) 98.1 F (36.7 C)  TempSrc:  Oral Oral Oral  SpO2:  98% 98% 97%  Weight:      Height:        Intake/Output Summary (Last 24 hours) at 05/19/17 1415 Last data filed at 05/19/17 0905  Gross per 24 hour  Intake             1580 ml  Output                0 ml  Net             1580 ml   Filed Weights   05/16/17 1309 05/16/17 1818  Weight: 90.7 kg (200 lb) 91.3 kg (201 lb 4.5 oz)    Examination:  Vitals:   05/18/17 1446 05/18/17 1650 05/18/17 2101 05/19/17 0455  BP: 98/62 (!) 90/49 (!) 94/53 (!) 97/57  Pulse:  99 (!) 108 (!) 102  Resp:  17 16 16   Temp:  98.7 F (37.1 C) 98 F (36.7 C) 98.1 F (36.7 C)  TempSrc:  Oral Oral Oral  SpO2:  98% 98% 97%  Weight:      Height:        Constitutional: pale, ill appearing caucasian male Neck: normal, supple, no masses Respiratory: clear to auscultation bilaterally, no wheezing, no crackles. Normal respiratory effort. No accessory muscle use.  Cardiovascular: Regular rate and rhythm, no murmurs / rubs / gallops. No LE edema. 2+ pedal pulses. No carotid bruits.  Abdomen: + mild tenderness. Neurologic: CN 2-12 grossly intact. Strength 5/5 in all 4.  Psychiatric: Normal judgment and insight. Alert and oriented x 3. Normal mood.    Data Reviewed: I have  personally reviewed following labs and imaging studies  CBC:  Recent Labs Lab 05/14/17 1848 05/15/17 0542 05/16/17 1330 05/17/17 0841 05/19/17 0805  WBC 12.5* 14.6* 17.9* 29.4* 13.5*  NEUTROABS  --   --   --  27.3*  --   HGB 9.6* 8.9* 8.9* 8.1* 7.7*  HCT 29.4* 27.4* 27.5* 24.9* 23.8*  MCV 98.7 100.4* 98.9 98.4 97.5  PLT 714* 684* 587* 406* 675   Basic Metabolic Panel:  Recent Labs Lab 05/15/17 0542 05/16/17 1330 05/17/17 0841 05/18/17 0520 05/19/17 0805  NA 128* 127* 127* 124* 126*  K 4.7 4.9 4.3 4.9 5.0  CL 94* 93* 95* 93* 93*  CO2 24 22 22 23 24   GLUCOSE 121* 126* 106* 120* 121*  BUN 22* 18 20 31* 42*  CREATININE 1.64* 1.49* 1.27* 1.78* 2.19*  CALCIUM 8.5* 8.1* 7.9* 7.7* 8.0*  MG  --  1.6* 1.8 2.0  --   PHOS  --  3.5 3.8  --   --    GFR: Estimated Creatinine Clearance: 43 mL/min (A) (by C-G formula based on SCr of 2.19 mg/dL (H)). Liver Function Tests:  Recent Labs Lab 05/14/17 1848 05/16/17 1330 05/17/17 0841  AST 17 34 33  ALT 10* 10* 14*  ALKPHOS 114 100 107  BILITOT 0.3 0.7 0.1*  PROT 5.9* 5.7* 4.9*  ALBUMIN 1.6* 1.5* 1.2*    Recent Labs Lab 05/14/17 1848 05/16/17 1330  LIPASE 31 26   No results for input(s): AMMONIA in the last 168 hours. Coagulation Profile:  Recent Labs Lab 05/15/17 0542  INR 0.85   CBG:  Recent Labs Lab 05/18/17 1948 05/19/17 0018 05/19/17 0349 05/19/17 0743 05/19/17 1210  GLUCAP 108* 132* 130* 120* 117*   Lipid Profile:  Recent Labs  05/17/17 0841  TRIG 107   Urine analysis:    Component Value Date/Time   COLORURINE YELLOW 05/16/2017 1314   APPEARANCEUR CLEAR 05/16/2017 1314   LABSPEC 1.030 05/16/2017 1314   PHURINE 5.0 05/16/2017 1314   GLUCOSEU NEGATIVE 05/16/2017 1314   HGBUR NEGATIVE 05/16/2017 1314   [place] 05/16/2017 1314   [place] 05/16/2017 1314   PROTEINUR 30 (A) 05/16/2017 1314   NITRITE NEGATIVE 05/16/2017 1314   LEUKOCYTESUR NEGATIVE 05/16/2017 1314    Sepsis Labs: Invalid input(s): PROCALCITONIN, LACTICIDVEN  Recent Results (from the past 240 hour(s))  Culture, Urine     Status: Abnormal   Collection Time: 05/16/17  1:15 PM  Result Value Ref Range Status   Specimen Description URINE, CLEAN CATCH  Final   Special Requests NONE  Final   Culture (A)  Final    <10,000 COLONIES/mL INSIGNIFICANT GROWTH Performed at [facility], [address] [place], [place] [address]    Report Status 05/18/2017 FINAL  Final  Body fluid culture     Status: None (Preliminary result)   Collection Time:  05/18/17  3:01 PM  Result Value Ref Range Status   Specimen Description PERITONEAL  Final   Special Requests NONE  Final   Gram Stain   Final    ABUNDANT WBC PRESENT, PREDOMINANTLY PMN NO ORGANISMS SEEN    Culture   Final    NO GROWTH < 24 HOURS Performed at [facility], [address], [place], [place] [address]    Report Status PENDING  Incomplete     Radiology Studies: US Paracentesis  Result Date: 05/18/2017 INDICATION: Patient with history of primary peritoneal mesothelioma, recurrent ascites. Request made for therapeutic paracentesis up to 6 liters. EXAM: ULTRASOUND GUIDED DIAGNOSTIC AND THERAPEUTIC PARACENTESIS MEDICATIONS: None. COMPLICATIONS: None immediate. PROCEDURE: Informed written consent was obtained from the patient after a discussion of the risks, benefits and alternatives to treatment. A timeout was performed prior to the initiation of the procedure. Initial ultrasound scanning demonstrates a large amount of ascites within the right lower abdominal quadrant. The right lower abdomen was prepped and draped in the usual sterile fashion. 1% lidocaine was used for local anesthesia. Following this, a Yueh catheter was introduced. An ultrasound image was saved for documentation purposes. The paracentesis was performed. The catheter was removed and a dressing was applied. The patient tolerated the procedure well without  immediate post procedural complication. FINDINGS: A total of approximately 6 liters of gelatinous, light yellow fluid was removed. Samples were sent to the laboratory as requested by the clinical team. IMPRESSION: Successful ultrasound-guided diagnostic and therapeutic paracentesis yielding 6 liters of peritoneal fluid. Read by: Rowe Robert, PA-C Electronically Signed   By: Jerilynn Mages.  Shick M.D.   On: 05/18/2017 15:12    Scheduled Meds: . dexamethasone  10 mg Intravenous Once  . docusate sodium  200 mg Oral BID  . heparin subcutaneous  5,000 Units Subcutaneous Q8H  . insulin aspart  0-9 Units Subcutaneous Q4H  . magnesium oxide  400 mg Oral BID  . morphine  30 mg Oral Q12H  . nystatin   Topical BID  . pantoprazole  40 mg Oral BID  . polyethylene glycol  17 g Oral Daily   Continuous Infusions: . sodium chloride    . albumin human    . cefTRIAXone (ROCEPHIN)  IV Stopped (05/18/17 2052)  . TPN (CLINIMIX) Adult without lytes     And  . fat emulsion    . Marland KitchenTPN (CLINIMIX-E) Adult 83 mL/hr at 05/19/17 0500    Marzetta Board, MD, PhD [facility] Pager [phone]  If 7PM-7AM, please contact night-coverage [email or web address] Password [email or web address] 05/19/2017, 2:15 PM

## 2017-05-19 NOTE — Progress Notes (Signed)
IP PROGRESS NOTE  Subjective:   Frank Tucker underwent a palliative paracentesis yesterday. He reports adequate pain control with MS Contin and Dilaudid. No nausea or vomiting. He is passing flatus. He tolerated liquids yesterday.   Objective: Vital signs in last 24 hours: Blood pressure (!) 97/57, pulse (!) 102, temperature 98.1 F (36.7 C), temperature source Oral, resp. rate 16, height 5\' 9"  (1.753 m), weight 201 lb 4.5 oz (91.3 kg), SpO2 97 %.  Intake/Output from previous day: 05/29 0701 - 05/30 0700 In: 2376 [P.O.:690; I.V.:1050; IV Piggyback:50] Out: -   Physical Exam:  HEENT: No thrush or ulcers Abdomen: Less Distended  Extremities: Pitting edema at the lower leg bilaterally   Portacath/PICC-without erythema  Lab Results:  Recent Labs  05/16/17 1330 05/17/17 0841  WBC 17.9* 29.4*  HGB 8.9* 8.1*  HCT 27.5* 24.9*  PLT 587* 406*    BMET  Recent Labs  05/17/17 0841 05/18/17 0520  NA 127* 124*  K 4.3 4.9  CL 95* 93*  CO2 22 23  GLUCOSE 106* 120*  BUN 20 31*  CREATININE 1.27* 1.78*  CALCIUM 7.9* 7.7*    Studies/Results: US Paracentesis  Result Date: 05/18/2017 INDICATION: Patient with history of primary peritoneal mesothelioma, recurrent ascites. Request made for therapeutic paracentesis up to 6 liters. EXAM: ULTRASOUND GUIDED DIAGNOSTIC AND THERAPEUTIC PARACENTESIS MEDICATIONS: None. COMPLICATIONS: None immediate. PROCEDURE: Informed written consent was obtained from the patient after a discussion of the risks, benefits and alternatives to treatment. A timeout was performed prior to the initiation of the procedure. Initial ultrasound scanning demonstrates a large amount of ascites within the right lower abdominal quadrant. The right lower abdomen was prepped and draped in the usual sterile fashion. 1% lidocaine was used for local anesthesia. Following this, a Yueh catheter was introduced. An ultrasound image was saved for documentation purposes. The  paracentesis was performed. The catheter was removed and a dressing was applied. The patient tolerated the procedure well without immediate post procedural complication. FINDINGS: A total of approximately 6 liters of gelatinous, light yellow fluid was removed. Samples were sent to the laboratory as requested by the clinical team. IMPRESSION: Successful ultrasound-guided diagnostic and therapeutic paracentesis yielding 6 liters of peritoneal fluid. Read by: Rowe Robert, PA-C Electronically Signed   By: Jerilynn Mages.  Shick M.D.   On: 05/18/2017 15:12    Medications: I have reviewed the patient's current medications.  Assessment/Plan: 1. Abdominal carcinomatosis-primary peritoneal mesothelioma ? Paracentesis on 12/17/2016 confirmed malignant cells consistent with carcinoma, cytokeratin and WT-1 positive ? CTs of the chest, abdomen, and pelvis with ascites and omental caking. No other evidence of metastatic disease and no primary tumor site identified ? Upper endoscopy 12/29/2016-negative for malignancy ? PET scan 01/01/2017 with extensive hypermetabolic omental caking of tumor with extensive ascites, no primary tumor site identified ? 01/08/2017 omental mass biopsy-malignant mesothelioma ? Cycle 1 cisplatin/Alimta 01/21/2017 ? Cycle 2 cisplatin/Alimta 02/11/2017 ? Cycle 3 cisplatin/Alimta 03/04/2017 ? Cycle 4 cisplatin/Alimta 03/25/2017 ? CT 04/01/2017-persistent ascites and omental thickening/peritoneal nodularity-Slightly improved, new small bowel obstruction   2. History of Yeast rash in the axilla and groin.  3. Port-A-Cath placement 01/20/2017  4. Delayed nauseasecondary to chemotherapy-persistent following cycle 2 despite Decadron prophylaxis, improved with cycles 3 with the addition of Reglan  5. Malignant ascites. He undergoes palliative paracentesis procedures as needed, last on 05/15/2017  6. Elevated creatinine-likely  secondary to dehydration versus toxicity from cisplatin  Renal ultrasound 05/10/2017-no hydronephrosis  7. Neutropenia secondary to chemotherapy-resolved  8. Anemia secondary to chemotherapy, malnutrition and chronic disease  9. Admission 03/31/2017 with a partial small bowel obstruction-improved with bowel rest  10. Syncope event at the Cancer center 04/30/2017 secondary to dehydration  11. Abdominal x-ray 05/03/2017 compatible with mid to distal small bowel ileus or partial small bowel obstruction.          12.  Abdominal pain secondary to peritoneal mesothelioma and partial bowel obstruction  Frank Tucker appears improved compared to hospital admission. The pain is under better control. He is not vomiting. He would like to proceed with home Hospice care and continue TPN. We will make a referral to the [facility]. He appears stable for discharge to home.  Recommendations: 1. Continue MS Contin and Dilaudid for pain 2. Liquid diet 3. MiraLAX 4. Continue TPN 5. Referral to [facility] for home hospice care 6. Outpatient follow-up at the [facility] 05/26/2017    LOS: 3 days   Donneta Romberg, MD   05/19/2017, 8:29 AM

## 2017-05-19 NOTE — Progress Notes (Signed)
[facility] NOTE   Pharmacy Consult for TPN Indication: Partial SBO, malnutrition  Patient Measurements: Height: 5\' 9"  (175.3 cm) Weight: 201 lb 4.5 oz (91.3 kg) IBW/kg (Calculated) : 70.7 TPN AdjBW (KG): 75.9 Body mass index is 29.72 kg/m. Usual Weight:   Insulin Requirements:  1 units sensitive SSI/24h  Current Nutrition: TPN at 76ml/hr + lipids 240 mL/day providing 100g protein, 1894 kcal per day  IVF: None  Central access: implanted PAC TPN start date: prior to admission on 5/23 Home TPN provided: 95gm protein and 1645 Kcal  ASSESSMENT                                                                                                          HPI: 56 yo male with mesothelioma of the abdominal cavity with peritoneal carcinomatosis with malignant ascites admitted with recurrent ascites and nausea vomiting and partial small bowel obstruction.  He was started on TPN at home for unintentional weight loss along with dehydration and hyponatremia; Pharmacy is consulted to continue managing upon admission.  Significant events:   Today, 05/19/2017:   Glucose - no hx DM, CBGs controlled  Electrolytes - continued hyponatremia, but improved to 126 today. Corr Ca (alb = 1.2) = 9.94, Mag WNL (on Mag-Ox), Phos WNL yesterday. K+ at upper limit of normal today.  Renal - SCr/BUN elevated and increasing, CKD stage 3  LFTs - wnl/low  TGs - 107 (5/28)  Prealbumin - 9.6 (5/28)  NUTRITIONAL GOALS                                                                                             RD recs: 120-130g protein (~1.3-1.4 g/kg), 1324-4010 kcal (26-27 kcal/g) Clinimix E 5/15 at a goal rate of 100 ml/hr over 24hr + 20% fat emulsion at 42ml/hr over 12hr to provide: 120g/day protein, 2184 Kcal/day. Clinimix 5/20 at 100 ml/hr over 24 hours + 20% fat emulsion at 20 ml/hr over 12hr will provide: 120g/day protein, 2592 Kcal/day.  PLAN  At 1800 today:  Switch to Clinimix 5/20 tonight.  With increasing potassium will change to electrolyte-free formulation in effort to increase to nutrition goals without increasing K+ supplementation.  Clinimix 5/20 is the only available Clinimix product without electrolytes we have stocked due to national shortages.  Continue with current rate of 83 ml/hr and plan to advance to 100 ml/hr to meet new nutrition goals if CBGs remain controlled.  Due to hyponatremia, will continue to adjust sodium content in TPN to 0.9% NS.  20% fat emulsion at 64ml/hr x 12 hrs  TPN to contain standard multivitamins daily and trace elements MWF.  SSI q4h sensitive scale - reduce # of checks as tolerates  TPN lab panels on Mondays & Thursdays.  F/u daily.  During morning floor huddle with MD, patient was being considered for discharge today.  This afternoon, patient complaining of more pain, requiring IV dilaudid.  Will prepare TPN for tonight.  Hershal Coria, PharmD, BCPS Pager: [phone] 05/19/2017 1:17 PM

## 2017-05-19 NOTE — Progress Notes (Signed)
Daily Progress Note   Patient Name: Frank Tucker       Date: 05/19/2017 DOB: 1960-12-30  Age: 56 y.o. MRN#: [id number] Attending Physician: Caren Griffins, MD Primary Care Physician: Patient, No Pcp Per Admit Date: 05/16/2017  Reason for Consultation/Follow-up: Establishing goals of care, Non pain symptom management and Pain control  Subjective: Patient sitting on edge of bed. Reports increasing pain and nausea. He met with [facility] and plan is for him to go home with [facility] in place. They will continue his TPN to allow him to meet his goals of arranging financial situation for his family. He expressed concerns discussing his situation with his family. Offered PMT support- he will call if he wants PMT presence. Offered addition of dexamethasone and continuous infusion of octreotide for symptom management, he agrees. Discussed option of consulting IR for pleurex cath for continuous drainage of peritoneal fluid to avoid ongoing paracentesis, but he declined.  Review of Systems  Constitutional: Positive for malaise/fatigue.  Gastrointestinal: Positive for abdominal pain, nausea and vomiting.  Neurological: Positive for weakness.    Length of Stay: 3  Current Medications: Scheduled Meds:  . dexamethasone  10 mg Intravenous Once  . docusate sodium  200 mg Oral BID  . heparin subcutaneous  5,000 Units Subcutaneous Q8H  . insulin aspart  0-9 Units Subcutaneous Q4H  . magnesium oxide  400 mg Oral BID  . morphine  30 mg Oral Q12H  . nystatin   Topical BID  . pantoprazole  40 mg Oral BID  . polyethylene glycol  17 g Oral Daily  . senna  2 tablet Oral BID    Continuous Infusions: . sodium chloride    . albumin human    . cefTRIAXone (ROCEPHIN)  IV Stopped (05/18/17  2052)  . TPN (CLINIMIX) Adult without lytes     And  . fat emulsion    . octreotide  (SANDOSTATIN)    IV infusion    . Marland KitchenTPN (CLINIMIX-E) Adult 83 mL/hr at 05/19/17 0500    PRN Meds: acetaminophen, albumin human, albuterol, bisacodyl, HYDROmorphone (DILAUDID) injection, HYDROmorphone, LORazepam, LORazepam, ondansetron (ZOFRAN) IV, promethazine, sodium chloride flush  Physical Exam  Constitutional: He appears well-developed and well-nourished. He appears distressed.  Eyes:  Pinpoint pupils from opoid use  Cardiovascular: Normal rate.   Pulmonary/Chest: Effort  normal.  Abdominal: He exhibits distension. There is tenderness.  Neurological: He is alert.  Skin: There is pallor.  Psychiatric:  Speech slow, forced  Nursing note and vitals reviewed.           Vital Signs: BP (!) 97/57 (BP Location: Right Arm)   Pulse (!) 102   Temp 98.1 F (36.7 C) (Oral)   Resp 16   Ht 5' 9"  (1.753 m)   Wt 91.3 kg (201 lb 4.5 oz)   SpO2 97%   BMI 29.72 kg/m  SpO2: SpO2: 97 % O2 Device: O2 Device: Not Delivered O2 Flow Rate:    Intake/output summary:  Intake/Output Summary (Last 24 hours) at 05/19/17 1429 Last data filed at 05/19/17 4854  Gross per 24 hour  Intake             1580 ml  Output                0 ml  Net             1580 ml   LBM: Last BM Date: 05/15/17 Baseline Weight: Weight: 90.7 kg (200 lb) Most recent weight: Weight: 91.3 kg (201 lb 4.5 oz)       Palliative Assessment/Data: PPS:    Flowsheet Rows     Most Recent Value  Intake Tab  Referral Department  Hospitalist  Unit at Time of Referral  Oncology Unit  Palliative Care Primary Diagnosis  Cancer  Date Notified  05/16/17  Palliative Care Type  New Palliative care  Reason for referral  Clarify Goals of Care  Date of Admission  05/16/17  Date first seen by Palliative Care  05/17/17  # of days Palliative referral response time  1 Day(s)  # of days IP prior to Palliative referral  0  Clinical Assessment    Psychosocial & Spiritual Assessment  Palliative Care Outcomes      Patient Active Problem List   Diagnosis Date Noted  . Malnutrition of moderate degree 05/17/2017  . Palliative care by specialist   . Goals of care, counseling/discussion   . Advance care planning   . Intractable pain 05/14/2017  . Abdominal pain   . Nausea with vomiting 03/31/2017  . Dehydration 03/31/2017  . Chemotherapy induced neutropenia ([place]) 03/31/2017  . Unintentional weight loss 03/31/2017  . 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 Hyponatremia 03/31/2017  . AKI (acute kidney injury) ([place]) 03/31/2017  . Port catheter in place 01/29/2017  . 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 Peritoneal carcinomatosis ([place]) 12/16/2016  . Malignant ascites 12/16/2016    Palliative Care Assessment & Plan   Patient Profile: 56 y.o. 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 Workup reveals partial small bowel obstruction. This is a readmission after being discharged home on 5/27 for hospitalization for malignant ascites and hyponatremia during which he was started on IV TPN. TPN was started in hopes of improving performance status so that he could possibly receive salvage chemotherapy in the future. Patient was seen by Dr. Benay Spice on 5/28 and option of Hospice was also discussed. Palliative medicine consulted for [place].   Assessment/Recommendations/Plan   Ongoing pain and nausea- worsening today-   start dexamethasone- loading dose 78m IV once today, followed by 458mIV daily- this can be transitioned to PO at discharge  Start continuous IV octreotide 30mg/hr today- will titrate every 24hrs for effect as tolerated- this can be mixed into patient's TPN  upon admission for comfort   Plan for patient to be dc'd home with Hospice-  Continue TPN at home- this is palliative in that it is enabling patient to continue to work and meet his goal of ensuring financial arrangements for his family before he dies. He does not plan on further interventions for his aggressive cancer. In this case I do not believe the TPN is life prolonging.  Goals of Care and Additional Recommendations:  Limitations on Scope of Treatment: Avoid Hospitalization and No Chemotherapy  Code Status:  DNR  Prognosis:   < 3 months d/t advance cancer with poor functional and nutritional status. Current plan is for comfort and palliative measures only.  Discharge Planning:  Home with Hospice  Care plan was discussed with patient, patient's, RN, and Dr. BRhea Pink  Thank you for allowing the Palliative Medicine Team to assist in the care of this patient.   Time In: 1400 Time Out: 1440 Total Time 40 minutes Prolonged Time Billed No      Greater than 50%  of this time was spent counseling and coordinating care related to the above assessment and plan.  KMariana Kaufman AGNP-C Palliative Medicine   Please contact Palliative Medicine Team phone at [phone] questions and concerns.

## 2017-05-20 ENCOUNTER — Telehealth: Payer: Self-pay | Admitting: *Deleted

## 2017-05-20 DIAGNOSIS — E440 Moderate protein-calorie malnutrition: Secondary | ICD-10-CM

## 2017-05-20 DIAGNOSIS — Z7189 Other specified counseling: Secondary | ICD-10-CM

## 2017-05-20 LAB — GLUCOSE, CAPILLARY
GLUCOSE-CAPILLARY: 128 mg/dL — AB (ref 65–99)
GLUCOSE-CAPILLARY: 148 mg/dL — AB (ref 65–99)
Glucose-Capillary: 153 mg/dL — ABNORMAL HIGH (ref 65–99)
Glucose-Capillary: 157 mg/dL — ABNORMAL HIGH (ref 65–99)
Glucose-Capillary: 211 mg/dL — ABNORMAL HIGH (ref 65–99)

## 2017-05-20 LAB — COMPREHENSIVE METABOLIC PANEL
ALT: 29 U/L (ref 17–63)
AST: 49 U/L — AB (ref 15–41)
Albumin: <1 g/dL — ABNORMAL LOW (ref 3.5–5.0)
Alkaline Phosphatase: 105 U/L (ref 38–126)
Anion gap: 8 (ref 5–15)
BUN: 44 mg/dL — AB (ref 6–20)
CHLORIDE: 94 mmol/L — AB (ref 101–111)
CO2: 25 mmol/L (ref 22–32)
CREATININE: 1.82 mg/dL — AB (ref 0.61–1.24)
Calcium: 7.7 mg/dL — ABNORMAL LOW (ref 8.9–10.3)
GFR calc Af Amer: 47 mL/min — ABNORMAL LOW (ref >60)
GFR calc non Af Amer: 40 mL/min — ABNORMAL LOW (ref >60)
Glucose, Bld: 158 mg/dL — ABNORMAL HIGH (ref 65–99)
Potassium: 4.6 mmol/L (ref 3.5–5.1)
SODIUM: 127 mmol/L — AB (ref 135–145)
Total Bilirubin: 0.3 mg/dL (ref 0.3–1.2)
Total Protein: 4.5 g/dL — ABNORMAL LOW (ref 6.5–8.1)

## 2017-05-20 LAB — PHOSPHORUS: PHOSPHORUS: 3.6 mg/dL (ref 2.5–4.6)

## 2017-05-20 LAB — MAGNESIUM: Magnesium: 2 mg/dL (ref 1.7–2.4)

## 2017-05-20 MED ORDER — HYDROMORPHONE HCL 4 MG PO TABS: 4.0000 mg | ORAL_TABLET | ORAL | 0 refills | Status: DC | PRN

## 2017-05-20 MED ORDER — LORAZEPAM 1 MG PO TABS: 1.0000 mg | ORAL_TABLET | Freq: Four times a day (QID) | ORAL | 0 refills | Status: AC | PRN

## 2017-05-20 MED ORDER — DEXAMETHASONE 4 MG PO TABS: 4.0000 mg | ORAL_TABLET | Freq: Every day | ORAL | 0 refills | Status: AC

## 2017-05-20 MED ORDER — CIPROFLOXACIN HCL 500 MG PO TABS: 500.0000 mg | ORAL_TABLET | Freq: Two times a day (BID) | ORAL | 0 refills | Status: AC

## 2017-05-20 MED ORDER — HEPARIN SOD (PORK) LOCK FLUSH 100 UNIT/ML IV SOLN
500.0000 [IU] | Freq: Once | INTRAVENOUS | Status: AC
  Administered 2017-05-20: 500 [IU] via INTRAVENOUS
  Filled 2017-05-20: qty 5

## 2017-05-20 MED ORDER — HYDROMORPHONE HCL 4 MG PO TABS: 4.0000 mg | ORAL_TABLET | ORAL | 0 refills | Status: AC | PRN

## 2017-05-20 MED ORDER — MORPHINE SULFATE ER 30 MG PO TBCR: 30.0000 mg | EXTENDED_RELEASE_TABLET | Freq: Two times a day (BID) | ORAL | 0 refills | Status: DC

## 2017-05-20 NOTE — Progress Notes (Signed)
[place] NOTE   Pharmacy Consult for TPN Indication: Partial SBO, malnutrition  Patient Measurements: Height: 5\' 9"  (175.3 cm) Weight: 201 lb 4.5 oz (91.3 kg) IBW/kg (Calculated) : 70.7 TPN AdjBW (KG): 75.9 Body mass index is 29.72 kg/m. 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  He was started on TPN at home for unintentional weight loss along with dehydration and hyponatremia; Pharmacy is consulted to continue managing upon admission.  Significant events:   Today, 05/20/2017:   Glucose - no hx DM, CBGs elevated which could either be in response to the increase in dextrose in the TPN or the dose of decadron 10 mg given yesterday.  Will be starting decadron 4 mg IV daily today.  Electrolytes - continued hyponatremia, but improving with addition of sodium to TPN. Removed other electrolytes from TPN bag last night to avoid hyperkalemia and K+, Mag, Phos this morning. Unable to calculate corrected Calcium since albumin level <1.0. On Mag-Ox.  Renal - SCr/BUN elevated, CKD stage 3  LFTs - ok  TGs - 107 (5/28)  Prealbumin - 9.6 (5/28)  NUTRITIONAL GOALS                                                                                             RD recs: 120-130g protein (~1.3-1.4 g/kg), 0881-1031 kcal (26-27 kcal/g) Clinimix E 5/15 at a goal rate of 100 ml/hr over 24hr  + 20% fat emulsion at 42ml/hr over 12hr to provide: 120g/day protein, 2184 Kcal/day. Clinimix 5/20 at 100 ml/hr over 24 hours + 20% fat emulsion at 20 ml/hr over 12hr will provide: 120g/day protein, 2592 Kcal/day.  PLAN  Patient will be discharge later today.  Per discussion with hospice nurse, patient is in agreement to resume TPN with them tomorrow morning.  Will not prepare TPN for patient tonight.    Hershal Coria, PharmD, BCPS Pager: [phone] 05/20/2017 11:59 AM

## 2017-05-20 NOTE — Progress Notes (Signed)
Pt to dc home today and will be admitted by [facility] tomorrow morning per pt request. Pt does not want hospice to come in the home this evening to start services and TPN. He will dc home via car. All information provided to this CM by [facility] Lupita Shutter [phone]). Marney Doctor RN,BSN,NCM [phone]

## 2017-05-20 NOTE — Discharge Summary (Signed)
Physician Discharge Summary  Frank Tucker UXN:[id number] DOB: [birth year] DOA: 05/16/2017  PCP: Patient, No Pcp Per  Admit date: 05/16/2017 Discharge date: 05/20/2017  Admitted From: home Disposition:  Home with hospice  Recommendations for Outpatient Follow-up:  1. Follow up with Dr. Benay Spice next week 2. Discharge home with hospice  Discharge Condition: guarded CODE STATUS: DNR Diet recommendation: on TPN  HPI: Per Dr. Candiss Norse, Frank Tucker  is a 56 y.o. 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 He comes back today with continued abdominal pain along with nausea vomiting, in the ER nausea vomiting was recurrent and I was called to admit him. He denies any fever or chills, no headache, no chest chest pain or shortness of breath at rest, no diarrhea, no blood in stool or urine. No focal weakness.  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  Palliative care was consulted and have follow patient while hospitalized, as well as oncology.  Patient will be discharged home with hospice  services following, in guarded condition given poor prognosis. Acute on CKD III -Creatinine in 2018 has been ranging between 1.5 and 2.1, may be related to recurrent paracentesis and fluid shift with intermittent hypotension, creatinine did get worse following the paracentesis however improved with gentle hydration. Hyponatremia -Due to malignancy/ascites, overall stable Leukocytosis -Concern for secondary bacterial peritonitis due to increased white cell count, continue Rocephin, will transition to ciprofloxacin and treat to complete the course as an outpatient Anemia of chronic disease -Secondary to chronic kidney disease and malignancy, hemoglobin stable Severe protein calorie malnutrition -In the context of chronic illness, continue TPN   Discharge Instructions   Allergies as of 05/20/2017   No Known Allergies     Medication List    STOP taking these medications   feeding supplement (ENSURE ENLIVE) Liqd   feeding supplement Liqd   folic acid 1 MG tablet Commonly known as:  FOLVITE   oxyCODONE-acetaminophen 5-325 MG tablet Commonly known as:  PERCOCET/ROXICET     TAKE these medications   acetaminophen 325 MG tablet Commonly known as:  TYLENOL Take 2 tablets (650 mg total) by mouth every 6 (six) hours as needed for mild pain or headache (or Fever >/= 101).   ADULT TPN Inject into the vein continuous. Nutritional TPN, [facility] in [place], Vit 1 & 2 infused in 2000 ml bag, goes continuously for 24 hours   bisacodyl 10 MG suppository Commonly known as:  DULCOLAX Place 1 suppository (10 mg total) rectally every 12 (twelve) hours as needed for mild constipation or moderate constipation.   ciprofloxacin 500 MG tablet Commonly known as:  CIPRO Take 1 tablet (500 mg total) by mouth 2 (two) times daily.  dexamethasone 4 MG tablet Commonly known as:  DECADRON Take 1 tablet (4 mg total) by mouth daily. For 3 days. Begin day after chemo. What changed:  when to take this     HYDROmorphone 4 MG tablet Commonly known as:  DILAUDID Take 1-2 tablets (4-8 mg total) by mouth every 4 (four) hours as needed for severe pain. What changed:  medication strength  how much to take   LORazepam 1 MG tablet Commonly known as:  ATIVAN Take 1 tablet (1 mg total) by mouth every 6 (six) hours as needed (nausea). What changed:  additional instructions   magnesium oxide 400 MG tablet Commonly known as:  MAG-OX Take 1 tablet (400 mg total) by mouth 2 (two) times daily.   metoCLOPramide 10 MG tablet Commonly known as:  REGLAN Take 1 tablet (10 mg total) by mouth 3 (three) times daily before meals. DO NOT TAKE COMPAZINE.   morphine 30 MG 12 hr tablet Commonly known as:  MS CONTIN Take 1 tablet (30 mg total) by mouth every 12 (twelve) hours.   nystatin powder Commonly known as:  MYCOSTATIN/NYSTOP Apply topically 2 (two) times daily. To under arm rash   ondansetron 8 MG tablet Commonly known as:  ZOFRAN Take 1 tablet (8 mg total) by mouth every 8 (eight) hours as needed for nausea or vomiting. Begin 3 days after chemo.   pantoprazole 40 MG tablet Commonly known as:  PROTONIX Take 1 tablet (40 mg total) by mouth 2 (two) times daily.   polyethylene glycol packet Commonly known as:  MIRALAX / GLYCOLAX Take 17 g by mouth daily.      Follow-up Information    Ladell Pier, MD Follow up.   Specialty:  Oncology Contact information: [address] [place] [address] [place] Follow up.   Specialty:  Home Health Services Contact information: [place] [address] [phone]          No Known Allergies  Consultations:  Palliative CARE  Oncology  Procedures/Studies:  Ct Abdomen Pelvis Wo Contrast  Result Date: 05/14/2017 CLINICAL DATA:  Acute onset of severe generalized abdominal pain, nausea and vomiting. Initial encounter. EXAM: CT ABDOMEN AND PELVIS WITHOUT CONTRAST TECHNIQUE:  Multidetector CT imaging of the abdomen and pelvis was performed following the standard protocol without IV contrast. COMPARISON:  CT of the abdomen and pelvis performed 03/31/2017 and 12/16/2016 FINDINGS: Lower chest: Trace bilateral pleural effusions are noted, left greater than right, with associated atelectasis. The visualized portions of the mediastinum are grossly unremarkable. There is apparent wall thickening at the distal esophagus, better characterized on coronal images. An underlying mass cannot be excluded. Endoscopy could be considered for further evaluation. Hepatobiliary: The liver is unremarkable in appearance. The gallbladder is unremarkable in appearance. The common bile duct remains normal in caliber. Pancreas: The pancreas is within normal limits. Spleen: The spleen is unremarkable in appearance. Adrenals/Urinary Tract: The adrenal glands are unremarkable in appearance. Nonspecific perinephric stranding is noted bilaterally. The kidneys are otherwise unremarkable in appearance. There is no evidence of hydronephrosis. No renal or ureteral stones are identified. Stomach/Bowel: The stomach is unremarkable in appearance. There is wall thickening along the visualized small bowel, raising question for infectious or inflammatory ileitis. The appendix is normal in caliber, without evidence of appendicitis. The patient's peritoneal carcinomatosis abuts the splenic flexure of the colon. The colon is otherwise unremarkable in appearance. Vascular/Lymphatic: The abdominal aorta is unremarkable in  appearance. The inferior vena cava is grossly unremarkable. No retroperitoneal lymphadenopathy is seen. No pelvic sidewall lymphadenopathy is identified. Reproductive: The bladder is mildly distended and grossly unremarkable. The prostate remains borderline normal in size. Other: Diffuse peritoneal carcinomatosis is noted within the abdomen and pelvis. Large volume ascites is noted throughout the abdomen and pelvis.  Musculoskeletal: No acute osseous abnormalities are identified. The visualized musculature is unremarkable in appearance. IMPRESSION: 1. Diffuse peritoneal carcinomatosis within the abdomen and pelvis. 2. Large volume ascites noted throughout the abdomen and pelvis. 3. Peritoneal carcinomatosis abuts the splenic flexure of the colon. Colon otherwise unremarkable. 4. Wall thickening along the visualized small bowel, raising question for infectious or inflammatory ileitis. 5. Trace bilateral pleural effusions, left greater than right, with associated atelectasis. 6. Apparent wall thickening at the distal esophagus, better characterized on coronal images. This appears to be new from December. Underlying mass cannot be excluded. Endoscopy could be considered for further evaluation. Electronically Signed   By: Garald Balding M.D.   On: 05/14/2017 22:37   Ct Abdomen Pelvis W Contrast  Result Date: 05/16/2017 CLINICAL DATA:  Increased abdominal pain and nausea. History of stomach cancer, ascites tapped on Friday. Tachypnea. Slight jaundice. EXAM: CT ABDOMEN AND PELVIS WITH CONTRAST TECHNIQUE: Multidetector CT imaging of the abdomen and pelvis was performed using the standard protocol following bolus administration of intravenous contrast. CONTRAST:  129mL ISOVUE-300 IOPAMIDOL (ISOVUE-300) INJECTION 61% COMPARISON:  CT abdomen dated 05/14/2017 FINDINGS: Lower chest: Small bilateral pleural effusions with adjacent atelectasis. Fluid/debris within the mildly distended lower esophagus. Hepatobiliary: No focal liver abnormality is seen. Gallbladder is mildly distended but otherwise unremarkable. No bile duct dilatation seen. Pancreas: Unremarkable. No pancreatic ductal dilatation or surrounding inflammatory changes. Spleen: Normal in size without focal abnormality. Adrenals/Urinary Tract: Adrenal glands appear normal. Kidneys are unremarkable without mass, stone or hydronephrosis. No ureteral or bladder calculi identified.  Bladder is unremarkable. Stomach/Bowel: Bowel is normal in caliber. Stomach is moderately distended with fluid. Walls of the proximal small bowel appear thickened, likely reactive to the surrounding ascites. The more proximal duodenum is filled with fluid but not significantly dilated. Vascular/Lymphatic: No significant vascular findings are present. No enlarged abdominal or pelvic lymph nodes. Reproductive: Prostate is unremarkable. Other: Large volume ascites throughout the abdomen and pelvis. Again noted is the extensive omental thickening/carcinomatosis. No free intraperitoneal air. Musculoskeletal: No acute or suspicious osseous finding. IMPRESSION: 1. Large volume ascites throughout the abdomen and pelvis. 2. Diffuse soft tissue density omental thickening and nodularity, consistent with patient's known carcinomatosis. 3. Majority of the small bowel has thickened walls, most prominently seen within the jejunum, likely reactive to the surrounding ascites and/or involvement by carcinomatosis. Stomach and proximal duodenum are fluid-filled but not significantly distended, and the lower esophagus is mildly distended with fluid, perhaps indicating a mild partial obstruction related to the small bowel wall thickening. The fluid within the esophagus may predispose the patient to aspiration. 4. Small bilateral pleural effusions. Electronically Signed   By: Franki Cabot M.D.   On: 05/16/2017 15:22   US Renal  Result Date: 05/10/2017 CLINICAL DATA:  56 year old male with peritoneal mesothelioma. Elevated creatinine. Subsequent encounter. EXAM: RENAL / URINARY TRACT ULTRASOUND COMPLETE COMPARISON:  03/31/2017 CT. FINDINGS: Right Kidney: Length: 10.5 cm. Echogenicity within normal limits. No mass or hydronephrosis visualized. Left Kidney: Length: 11 cm. No hydronephrosis. Lower pole 9 mm echogenic focus of indeterminate etiology. Bladder: Appears normal for degree of bladder distention. Ascites.  Slightly lobulated  prostate gland. IMPRESSION: No evidence  hydronephrosis. 9 mm nonspecific lesion inferior aspect left kidney. No calcification seen on prior CT at this level. Low-density structure noted on prior CT and therefore this may represent a complex cyst. Alternatively this may represent focal indentation of fat. This did not appear hypermetabolic on 06/13/7627 PET-CT. Ascites. Slightly lobulated prostate gland. Electronically Signed   By: Genia Del M.D.   On: 05/10/2017 20:07   US Paracentesis  Result Date: 05/18/2017 INDICATION: Patient with history of primary peritoneal mesothelioma, recurrent ascites. Request made for therapeutic paracentesis up to 6 liters. EXAM: ULTRASOUND GUIDED DIAGNOSTIC AND THERAPEUTIC PARACENTESIS MEDICATIONS: None. COMPLICATIONS: None immediate. PROCEDURE: Informed written consent was obtained from the patient after a discussion of the risks, benefits and alternatives to treatment. A timeout was performed prior to the initiation of the procedure. Initial ultrasound scanning demonstrates a large amount of ascites within the right lower abdominal quadrant. The right lower abdomen was prepped and draped in the usual sterile fashion. 1% lidocaine was used for local anesthesia. Following this, a Yueh catheter was introduced. An ultrasound image was saved for documentation purposes. The paracentesis was performed. The catheter was removed and a dressing was applied. The patient tolerated the procedure well without immediate post procedural complication. FINDINGS: A total of approximately 6 liters of gelatinous, light yellow fluid was removed. Samples were sent to the laboratory as requested by the clinical team. IMPRESSION: Successful ultrasound-guided diagnostic and therapeutic paracentesis yielding 6 liters of peritoneal fluid. Read by: Rowe Robert, PA-C Electronically Signed   By: Jerilynn Mages.  Shick M.D.   On: 05/18/2017 15:12   US Paracentesis  Result Date: 05/15/2017 INDICATION: Patient with  mesothelioma of the abdominal cavity and peritoneal carcinomatosis with recurrent abdominal ascites. Request is made for therapeutic paracentesis. EXAM: ULTRASOUND GUIDED THERAPEUTIC PARACENTESIS MEDICATIONS: 1% lidocaine COMPLICATIONS: None immediate. PROCEDURE: Informed written consent was obtained from the patient after a discussion of the risks, benefits and alternatives to treatment. A timeout was performed prior to the initiation of the procedure. Initial ultrasound scanning demonstrates a large amount of ascites within the right lower abdominal quadrant. The right lower abdomen was prepped and draped in the usual sterile fashion. 1% lidocaine was used for local anesthesia. Following this, a 19 gauge, 7-cm, Yueh catheter was introduced. An ultrasound image was saved for documentation purposes. The paracentesis was performed. The catheter was removed and a dressing was applied. The patient tolerated the procedure well without immediate post procedural complication. FINDINGS: A total of approximately 5.4 L of serous fluid was removed. IMPRESSION: Successful ultrasound-guided paracentesis yielding 5.4 liters of peritoneal fluid. The patient normally has a 4 L max for his outpatient paracenteses. He was not given a maximum today but did not to go over what we removed; therefore, the procedure was terminated. Read by: Saverio Danker, PA-C Electronically Signed   By: Sandi Mariscal M.D.   On: 05/15/2017 10:43   US Paracentesis  Result Date: 04/20/2017 INDICATION: Patient with history of primary peritoneal mesothelioma with abdominal carcinomatosis and recurrent malignant ascites. Request made for therapeutic paracentesis. EXAM: ULTRASOUND GUIDED THERAPEUTIC PARACENTESIS MEDICATIONS: None. COMPLICATIONS: None immediate. PROCEDURE: Informed written consent was obtained from the patient after a discussion of the risks, benefits and alternatives to treatment. A timeout was performed prior to the initiation of the  procedure. Initial ultrasound scanning demonstrates a large amount of ascites within the right lower abdominal quadrant. The right lower abdomen was prepped and draped in the usual sterile fashion. 1% lidocaine was used for local anesthesia. Following this, a  Yueh catheter was introduced. An ultrasound image was saved for documentation purposes. The paracentesis was performed. The catheter was removed and a dressing was applied. The patient tolerated the procedure well without immediate post procedural complication. FINDINGS: A total of approximately 8.8 liters of gelatinous, yellow fluid was removed. IMPRESSION: Successful ultrasound-guided therapeutic paracentesis yielding 8.8 liters of peritoneal fluid. Read by: Rowe Robert, PA-C Electronically Signed   By: Markus Daft M.D.   On: 04/20/2017 15:24   Dg Abd 2 Views  Result Date: 05/03/2017 CLINICAL DATA:  Lower abdominal pain and nausea. History of mesothelioma. EXAM: ABDOMEN - 2 VIEW COMPARISON:  None in PACs FINDINGS: There are loops of mildly distended gas and fluid-filled small bowel in the midline and left mid upper abdomen. No free extraluminal gas collections are observed. No significant large bowel gas is demonstrated. There is no rectal gas. The bony structures exhibit no acute abnormalities. There is linear atelectasis or scarring at the lung bases. IMPRESSION: Findings compatible with a mid to distal small bowel ileus or partial small bowel obstruction. There is no evidence of perforation. Electronically Signed   By: David  Martinique M.D.   On: 05/03/2017 12:32   Ir Paracentesis  Result Date: 05/10/2017 INDICATION: Recurrent ascites.  Request is made for therapeutic paracentesis. EXAM: ULTRASOUND GUIDED THERAPEUTIC PARACENTESIS MEDICATIONS: 10 mL 1% lidocaine COMPLICATIONS: None immediate. PROCEDURE: Informed written consent was obtained from the patient after a discussion of the risks, benefits and alternatives to treatment. A timeout was performed  prior to the initiation of the procedure. Initial ultrasound scanning demonstrates a large amount of ascites within the right lateral abdomen. The right lateral abdomen was prepped and draped in the usual sterile fashion. 1% lidocaine was used for local anesthesia. Following this, a 19 gauge, 7-cm, Yueh catheter was introduced. An ultrasound image was saved for documentation purposes. The paracentesis was performed. The catheter was removed and a dressing was applied. The patient tolerated the procedure well without immediate post procedural complication. FINDINGS: A total of approximately 4.0 liters of clear, yellow fluid was removed. IMPRESSION: Successful ultrasound-guided therapeutic paracentesis yielding 4.0 liters of peritoneal fluid. Read by:  Brynda Greathouse PA-C Electronically Signed   By: Markus Daft M.D.   On: 05/10/2017 15:28     Subjective: - no chest pain, shortness of breath, no abdominal pain, nausea or vomiting.   Discharge Exam: Vitals:   05/20/17 0452 05/20/17 1431  BP: 103/70 102/66  Pulse: 74 77  Resp: 16 18  Temp: 97.8 F (36.6 C) 97.9 F (36.6 C)   Vitals:   05/19/17 1349 05/19/17 2249 05/20/17 0452 05/20/17 1431  BP: (!) 93/55 99/69 103/70 102/66  Pulse: 95 82 74 77  Resp: 16 20 16 18   Temp: 97.8 F (36.6 C) 97.5 F (36.4 C) 97.8 F (36.6 C) 97.9 F (36.6 C)  TempSrc: Oral Oral Oral Oral  SpO2: 98% 95% 96% 97%  Weight:      Height:        General: Pt is alert, awake, not in acute distress Cardiovascular: RRR, S1/S2 +, no rubs, no gallops Respiratory: CTA bilaterally, no wheezing, no rhonchi Abdominal: Soft, NT, ND, bowel sounds + Extremities: no edema, no cyanosis    The results of significant diagnostics from this hospitalization (including imaging, microbiology, ancillary and laboratory) are listed below for reference.     Microbiology: Recent Results (from the past 240 hour(s))  Culture, Urine     Status: Abnormal   Collection Time: 05/16/17   1:15  PM  Result Value Ref Range Status   Specimen Description URINE, CLEAN CATCH  Final   Special Requests NONE  Final   Culture (A)  Final    <10,000 COLONIES/mL INSIGNIFICANT GROWTH Performed at [facility], [place] [address] [place], [place] [address]    Report Status 05/18/2017 FINAL  Final  Body fluid culture     Status: None (Preliminary result)   Collection Time: 05/18/17  3:01 PM  Result Value Ref Range Status   Specimen Description PERITONEAL  Final   Special Requests NONE  Final   Gram Stain   Final    ABUNDANT WBC PRESENT, PREDOMINANTLY PMN NO ORGANISMS SEEN    Culture   Final    NO GROWTH 2 DAYS Performed at [facility], [place] [address], [place], [place] [address]    Report Status PENDING  Incomplete     Labs: BNP (last 3 results)  Recent Labs  12/16/16 1209  BNP 8.5   Basic Metabolic Panel:  Recent Labs Lab 05/16/17 1330 05/17/17 0841 05/18/17 0520 05/19/17 0805 05/20/17 0521  NA 127* 127* 124* 126* 127*  K 4.9 4.3 4.9 5.0 4.6  CL 93* 95* 93* 93* 94*  CO2 22 22 23 24 25   GLUCOSE 126* 106* 120* 121* 158*  BUN 18 20 31* 42* 44*  CREATININE 1.49* 1.27* 1.78* 2.19* 1.82*  CALCIUM 8.1* 7.9* 7.7* 8.0* 7.7*  MG 1.6* 1.8 2.0  --  2.0  PHOS 3.5 3.8  --   --  3.6   Liver Function Tests:  Recent Labs Lab 05/14/17 1848 05/16/17 1330 05/17/17 0841 05/20/17 0521  AST 17 34 33 49*  ALT 10* 10* 14* 29  ALKPHOS 114 100 107 105  BILITOT 0.3 0.7 0.1* 0.3  PROT 5.9* 5.7* 4.9* 4.5*  ALBUMIN 1.6* 1.5* 1.2* <1.0*    Recent Labs Lab 05/14/17 1848 05/16/17 1330  LIPASE 31 26   No results for input(s): AMMONIA in the last 168 hours. CBC:  Recent Labs Lab 05/14/17 1848 05/15/17 0542 05/16/17 1330 05/17/17 0841 05/19/17 0805  WBC 12.5* 14.6* 17.9* 29.4* 13.5*  NEUTROABS  --   --   --  27.3*  --   HGB 9.6* 8.9* 8.9* 8.1* 7.7*  HCT 29.4* 27.4* 27.5* 24.9* 23.8*  MCV 98.7 100.4* 98.9 98.4 97.5  PLT 714* 684* 587* 406* 246   Cardiac  Enzymes: No results for input(s): CKTOTAL, CKMB, CKMBINDEX, TROPONINI in the last 168 hours. BNP: Invalid input(s): POCBNP CBG:  Recent Labs Lab 05/19/17 2246 05/20/17 0041 05/20/17 0449 05/20/17 0759 05/20/17 1210  GLUCAP 184* 211* 128* 157* 153*   D-Dimer No results for input(s): DDIMER in the last 72 hours. Hgb A1c No results for input(s): HGBA1C in the last 72 hours. Lipid Profile No results for input(s): CHOL, HDL, LDLCALC, TRIG, CHOLHDL, LDLDIRECT in the last 72 hours. Thyroid function studies No results for input(s): TSH, T4TOTAL, T3FREE, THYROIDAB in the last 72 hours.  Invalid input(s): FREET3 Anemia work up No results for input(s): VITAMINB12, FOLATE, FERRITIN, TIBC, IRON, RETICCTPCT in the last 72 hours. Urinalysis    Component Value Date/Time   COLORURINE YELLOW 05/16/2017 1314   APPEARANCEUR CLEAR 05/16/2017 1314   LABSPEC 1.030 05/16/2017 1314   PHURINE 5.0 05/16/2017 1314   GLUCOSEU NEGATIVE 05/16/2017 1314   HGBUR NEGATIVE 05/16/2017 1314   BILIRUBINUR NEGATIVE 05/16/2017 1314   [place] 05/16/2017 1314   PROTEINUR 30 (A) 05/16/2017 1314   NITRITE NEGATIVE 05/16/2017 1314  LEUKOCYTESUR NEGATIVE 05/16/2017 1314   Sepsis Labs Invalid input(s): PROCALCITONIN,  WBC,  LACTICIDVEN Microbiology Recent Results (from the past 240 hour(s))  Culture, Urine     Status: Abnormal   Collection Time: 05/16/17  1:15 PM  Result Value Ref Range Status   Specimen Description URINE, CLEAN CATCH  Final   Special Requests NONE  Final   Culture (A)  Final    <10,000 COLONIES/mL INSIGNIFICANT GROWTH Performed at [facility], [address] [place], [place] [address]    Report Status 05/18/2017 FINAL  Final  Body fluid culture     Status: None (Preliminary result)   Collection Time: 05/18/17  3:01 PM  Result Value Ref Range Status   Specimen Description PERITONEAL  Final   Special Requests NONE  Final   Gram Stain   Final    ABUNDANT WBC PRESENT,  PREDOMINANTLY PMN NO ORGANISMS SEEN    Culture   Final    NO GROWTH 2 DAYS Performed at [facility], [place] [address] [place], [place] [address]    Report Status PENDING  Incomplete     Time coordinating discharge: 45 minutes  SIGNED:  Marzetta Board, MD  [facility] 05/20/2017, 3:09 PM Pager [phone]  If 7PM-7AM, please contact night-coverage [email or web address] Password [email or web address]

## 2017-05-20 NOTE — Telephone Encounter (Signed)
Call placed back to [facility] with [facility] regarding request for Dr. Benay Spice to be pt.'s attending MD. Office is currently closed.  Will call back tomorrow.

## 2017-05-20 NOTE — Telephone Encounter (Signed)
"  This is  [facility]. [phone]).  This patient is currently in [place] referral has been received.  Will Dr. Benay Spice be the attending with diagnosis of Mesothelioma.  Please call with order and or fax records to [phone]."

## 2017-05-21 ENCOUNTER — Telehealth: Payer: Self-pay | Admitting: *Deleted

## 2017-05-21 NOTE — Telephone Encounter (Signed)
Pt called to ask why does he have cipro. Per discharge summary-"Leukocytosis -Concern for secondary bacterial peritonitis due to increased white cell count, continue Rocephin, will transition to ciprofloxacin and treat to complete the course as an outpatient" Pt notified of this information and voiced understanding.

## 2017-05-21 NOTE — Telephone Encounter (Signed)
Returned call to [place] at [facility]. Dr. Benay Spice will be attending MD. Per Tye [place], they will be out to admit pt today.

## 2017-05-21 NOTE — Telephone Encounter (Signed)
Per Jonelle Sidle, Hospice nurse: [facility] supplies their TPN. Spoke with Jeannene Patella, RN [facility]: Verbal orders given. Dr. Benay Spice is requesting to decrease total volume of TPN to 1Liter daily. Labs per pharmacy protocol. [facility] pharmacy will coordinate with [facility].

## 2017-05-21 NOTE — Telephone Encounter (Signed)
Call from [place], hospice nurse requesting orders for TPN. She reports pt had been receiving 2,000 mL in dextrose continuously at 80mL/hr with 2 bottles of vitamins daily. Per [place], hospice would rather not continue weekly labs.  Please fax order to [phone]

## 2017-05-22 LAB — BODY FLUID CULTURE: Culture: NO GROWTH

## 2017-05-24 ENCOUNTER — Telehealth: Payer: Self-pay | Admitting: *Deleted

## 2017-05-24 NOTE — Telephone Encounter (Signed)
Call received from [place] with [facility] requesting an order to change patient's port needle early today to prevent two visits to patient's home.  OK to change port needle given per order of Dr. Benay Spice.

## 2017-05-25 ENCOUNTER — Telehealth: Payer: Self-pay | Admitting: *Deleted

## 2017-05-25 DIAGNOSIS — C451 Mesothelioma of peritoneum: Secondary | ICD-10-CM

## 2017-05-25 MED ORDER — MORPHINE SULFATE ER 30 MG PO TBCR: EXTENDED_RELEASE_TABLET | ORAL | 0 refills | Status: AC

## 2017-05-25 NOTE — Telephone Encounter (Signed)
Message from [place], [place] with [facility]: Pt has to take his breakthrough pain med pretty much around the clock. He takes 8mg  Q4hours. Occasionally he can get away with taking one 4 mg tablet. He is on MS Contin 30 mg Q12 hours. Has frequent nausea. Lorazepam works best. He is taking 1mg  Q 6 hours. Pt also took Haldol this AM for nausea that wasn't relieved by Ativan.  Called pt, spoke with wife. Pt plans to come to appointment tomorrow. He is requesting paracentesis appt 6/6 as well. Discussed with Dr. Benay Spice: Increase MS Contin dose to 60 mg QAM and 30 mg QPM. Continue Dilaudid for breakthrough pain.   Returned call to [place], she will call pt/ family with narcotic instructions. Requests script with these instructions to be faxed to hospice. They will have it filled by their pharmacy.

## 2017-05-26 ENCOUNTER — Telehealth: Payer: Self-pay | Admitting: *Deleted

## 2017-05-26 ENCOUNTER — Ambulatory Visit (HOSPITAL_BASED_OUTPATIENT_CLINIC_OR_DEPARTMENT_OTHER): Payer: PRIVATE HEALTH INSURANCE | Admitting: Oncology

## 2017-05-26 ENCOUNTER — Telehealth: Payer: Self-pay | Admitting: Oncology

## 2017-05-26 VITALS — BP 88/57 | HR 96 | Temp 97.7°F | Resp 16 | Ht 69.0 in | Wt 193.3 lb

## 2017-05-26 DIAGNOSIS — R180 Malignant ascites: Secondary | ICD-10-CM

## 2017-05-26 DIAGNOSIS — G893 Neoplasm related pain (acute) (chronic): Secondary | ICD-10-CM

## 2017-05-26 DIAGNOSIS — C786 Secondary malignant neoplasm of retroperitoneum and peritoneum: Secondary | ICD-10-CM

## 2017-05-26 DIAGNOSIS — C451 Mesothelioma of peritoneum: Secondary | ICD-10-CM

## 2017-05-26 DIAGNOSIS — D630 Anemia in neoplastic disease: Secondary | ICD-10-CM

## 2017-05-26 DIAGNOSIS — K56600 Partial intestinal obstruction, unspecified as to cause: Secondary | ICD-10-CM

## 2017-05-26 DIAGNOSIS — D6481 Anemia due to antineoplastic chemotherapy: Secondary | ICD-10-CM

## 2017-05-26 DIAGNOSIS — E46 Unspecified protein-calorie malnutrition: Secondary | ICD-10-CM

## 2017-05-26 NOTE — Telephone Encounter (Signed)
Gave patient AVS and calender per 6/6 los  

## 2017-05-26 NOTE — Progress Notes (Signed)
[facility] OFFICE PROGRESS NOTE   Diagnosis: Peritoneal Mesothelioma  INTERVAL HISTORY:  Frank Tucker returns as scheduled.  He was discharged 5/31 and is enrolled in home hospice.  He continues TPN.  He has intermittent vomiting.  He is having bowel movements.  The abdominal pain has progressed.  The MS Contin was increased yesterday. He feels dizzy and reports several falls since hospital discharge. Objective:  Vital signs in last 24 hours:  Blood pressure (!) 88/57, pulse 96, temperature 97.7 F (36.5 C), temperature source Oral, resp. rate 16, height 5\' 9"  (1.753 m), weight 193 lb 4.8 oz (87.7 kg), SpO2 97 %.    HEENT: no thrush, mucous membranes are moist Resp: decreased breath sounds at the rt. Lower chest Cardio: rrr GI: distended, hypoactive bowel sounds Vascular: pitting edema at lower legs bilaterally  Portacath/PICC-without erythema  Lab Results:  Lab Results  Component Value Date   WBC 13.5 (H) 05/19/2017   HGB 7.7 (L) 05/19/2017   HCT 23.8 (L) 05/19/2017   MCV 97.5 05/19/2017   PLT 246 05/19/2017   NEUTROABS 27.3 (H) 05/17/2017    CMP     Component Value Date/Time   NA 127 (L) 05/20/2017 0521   NA 128 (L) 05/11/2017 1014   K 4.6 05/20/2017 0521   K 5.1 05/11/2017 1014   CL 94 (L) 05/20/2017 0521   CO2 25 05/20/2017 0521   CO2 26 05/11/2017 1014   GLUCOSE 158 (H) 05/20/2017 0521   GLUCOSE 92 05/11/2017 1014   BUN 44 (H) 05/20/2017 0521   BUN 26.4 (H) 05/11/2017 1014   CREATININE 1.82 (H) 05/20/2017 0521   CREATININE 2.0 (H) 05/11/2017 1014   CALCIUM 7.7 (L) 05/20/2017 0521   CALCIUM 8.4 05/11/2017 1014   PROT 4.5 (L) 05/20/2017 0521   PROT 5.1 (L) 05/11/2017 1014   ALBUMIN <1.0 (L) 05/20/2017 0521   ALBUMIN 1.3 (L) 05/11/2017 1014   AST 49 (H) 05/20/2017 0521   AST 15 05/11/2017 1014   ALT 29 05/20/2017 0521   ALT 7 05/11/2017 1014   ALKPHOS 105 05/20/2017 0521   ALKPHOS 130 05/11/2017 1014   BILITOT 0.3 05/20/2017 0521   BILITOT <0.22 05/11/2017 1014   GFRNONAA 40 (L) 05/20/2017 0521   GFRAA 47 (L) 05/20/2017 0521    Medications: I have reviewed the patient's current medications.  Assessment/Plan: 1. Abdominal carcinomatosis-primary peritoneal mesothelioma ? Paracentesis on 12/17/2016 confirmed malignant cells consistent with carcinoma, cytokeratin and WT-1 positive ? CTs of the chest, abdomen, and pelvis with ascites and omental caking. No other evidence of metastatic disease and no primary tumor site identified ? Upper endoscopy 12/29/2016-negative for malignancy ? PET scan 01/01/2017 with extensive hypermetabolic omental caking of tumor with extensive ascites, no primary tumor site identified ? 01/08/2017 omental mass biopsy-malignant mesothelioma ? Cycle 1 cisplatin/Alimta 01/21/2017 ? Cycle 2 cisplatin/Alimta 02/11/2017 ? Cycle 3 cisplatin/Alimta 03/04/2017 ? Cycle 4 cisplatin/Alimta 03/25/2017 ? CT 04/01/2017-persistent ascites and omental thickening/peritoneal nodularity-Slightly improved, new small bowel obstruction   2. History of Yeast rash in the axilla and groin.  3. Port-A-Cath placement 01/20/2017  4. Delayed nauseasecondary to chemotherapy-persistent following cycle 2 despite Decadron prophylaxis, improved with cycles 3 with the addition of Reglan  5. Malignant ascites. He undergoes palliative paracentesis procedures as needed, last on 05/15/2017  6. Elevated creatinine-likely secondary to dehydration versus toxicity from cisplatin  Renal ultrasound 05/10/2017-no hydronephrosis  7. Neutropenia secondary to chemotherapy-resolved  8. Anemia secondary to chemotherapy, malnutrition and chronic disease  9. Admission  03/31/2017 with a partial small bowel obstruction-improved with bowel rest  10. Syncope event at the Cancer center 04/30/2017  secondary to dehydration  11. Abdominal x-ray 05/03/2017 compatible with mid to distal small bowel ileus or partial small bowel obstruction.          12.  Abdominal pain secondary to peritoneal mesothelioma and partial bowel obstruction   Disposition:  Frank Tucker continues to have symtoms related to carcinomatosis.  The MS Contin dose was increased yesterday.  He will continue dilaudid for breakthrough pain.  He will continue TPN and home hospice care.  I encouraged him to get up slowly and use a walker.  He will return for an office visit next week. Donneta Romberg, MD  05/26/2017  1:57 PM

## 2017-05-26 NOTE — Telephone Encounter (Signed)
Call placed to [facility] with [facility] to request a wheelchair for pt.'s home use per order of Dr. Benay Spice.  Levada Dy stated that she will obtain walker for patient.

## 2017-06-01 ENCOUNTER — Encounter (HOSPITAL_COMMUNITY): Payer: Self-pay

## 2017-06-02 ENCOUNTER — Ambulatory Visit: Payer: PRIVATE HEALTH INSURANCE | Admitting: Nurse Practitioner

## 2017-06-07 ENCOUNTER — Telehealth: Payer: Self-pay | Admitting: *Deleted

## 2017-06-07 NOTE — Telephone Encounter (Signed)
Message received from patient's daughter requesting a call back.  Call placed back to patient's daughter, Cristie Hem and unable to leave message d/t voicemail box is full.

## 2017-06-08 ENCOUNTER — Telehealth: Payer: Self-pay | Admitting: *Deleted

## 2017-06-08 NOTE — Telephone Encounter (Signed)
"  I am Manuela Neptune, the daughter of Nicolus Ose.  I'd like to talk to Dr. Benay Spice.  I need to deliver the message myself.  My father passed away.  Return number [phone]."  Expressed condolences and will notify provider of this request.

## 2017-06-10 NOTE — Progress Notes (Signed)
Letter received from [facility] stating pt's date and time of death as 2017/06/12 at 0822. MD [facility] aware.

## 2017-06-20 DEATH — deceased

## 2017-08-04 ENCOUNTER — Other Ambulatory Visit: Payer: Self-pay | Admitting: Nurse Practitioner

## 2018-05-23 IMAGING — US US PARACENTESIS
1 series · 6 of 6 positions shown · non-contrast
Comparison: none

INDICATION: Peritoneal carcinomatosis, recurrent malignant ascites; request made
for therapeutic paracentesis.

[Series 1: us paracentesis · 0.26mm/px · 6 of 6 slices shown]
[im 1/6]
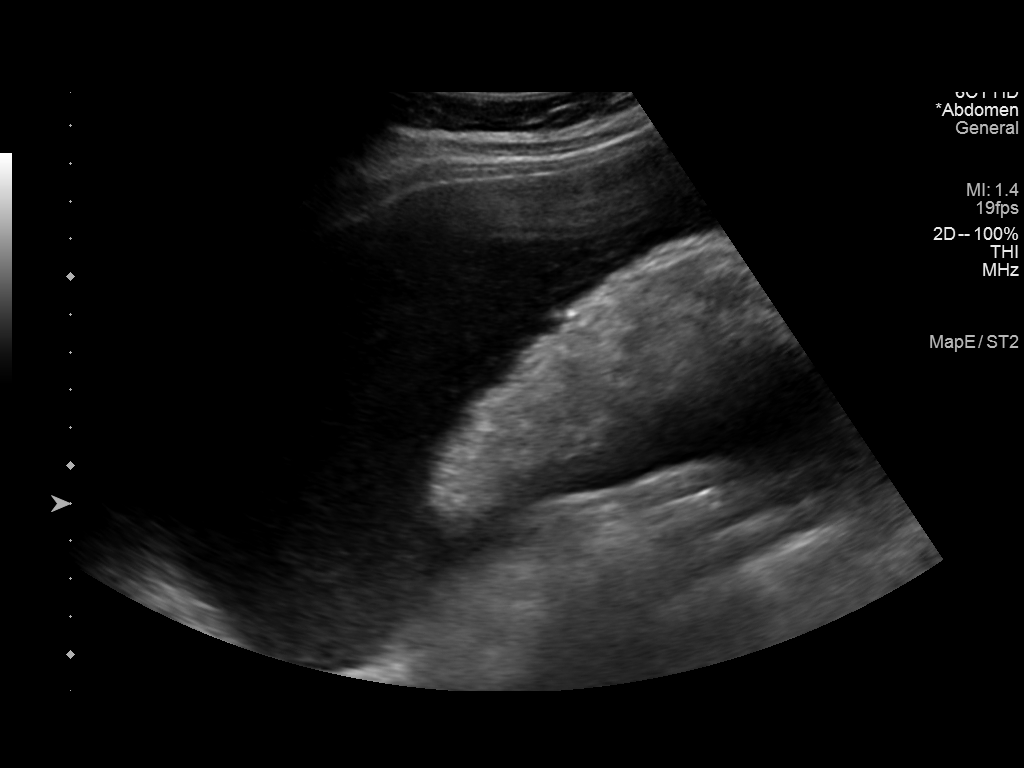
[im 2/6]
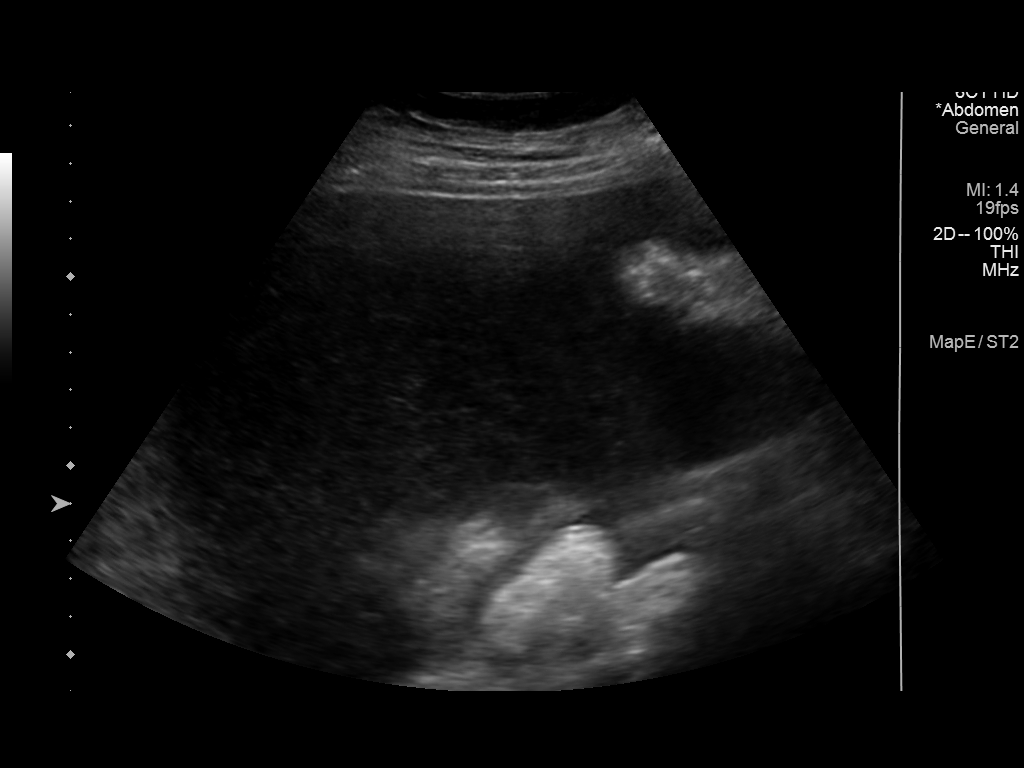
[im 3/6]
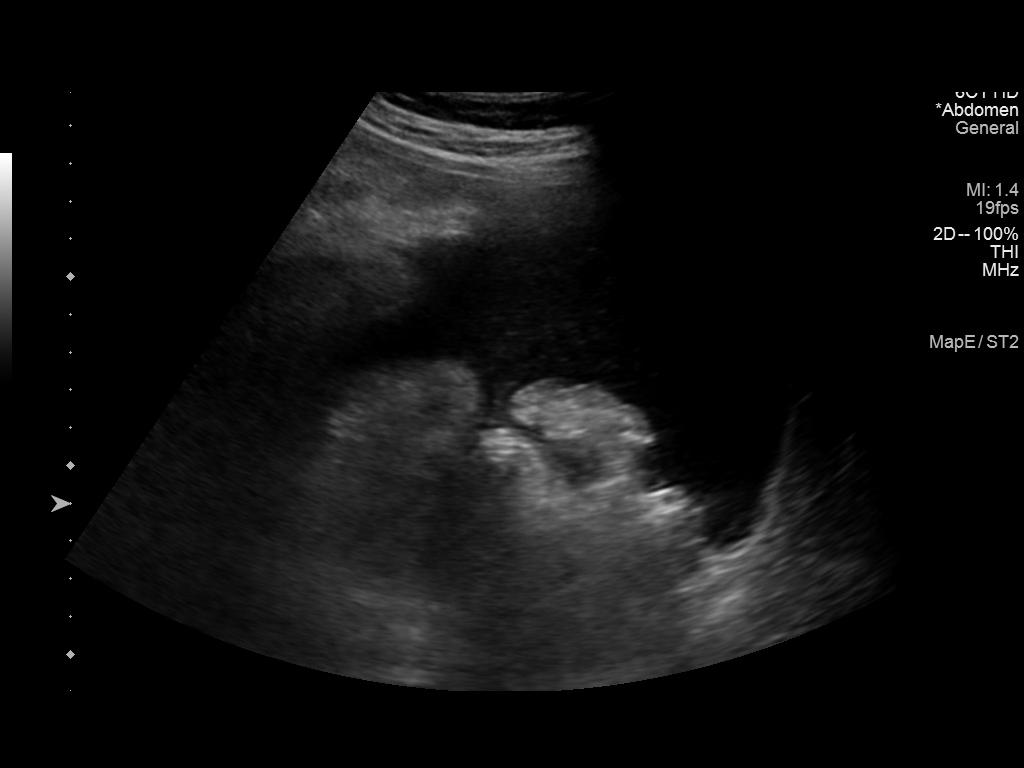
[im 4/6]
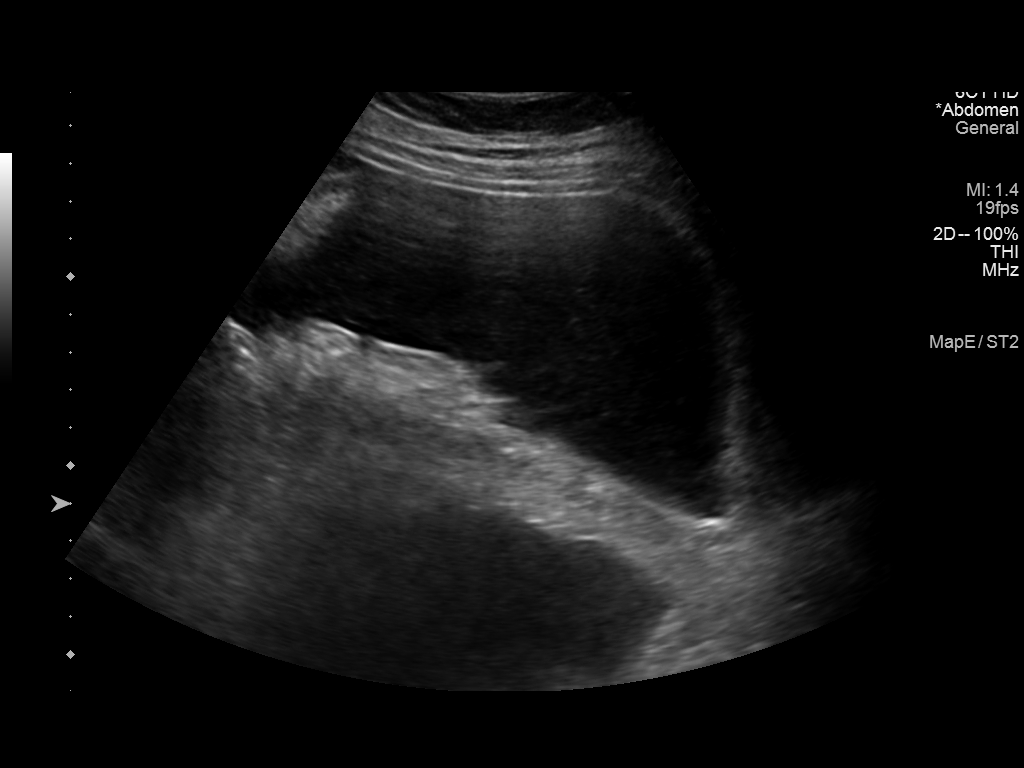
[im 5/6]
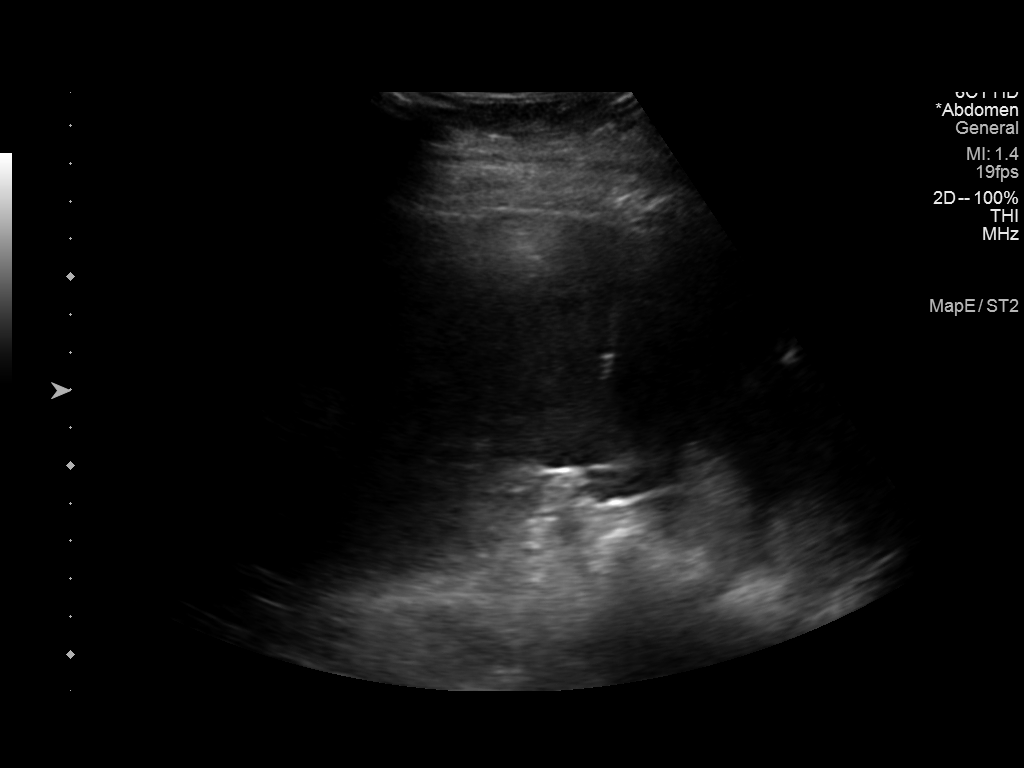
[im 6/6]
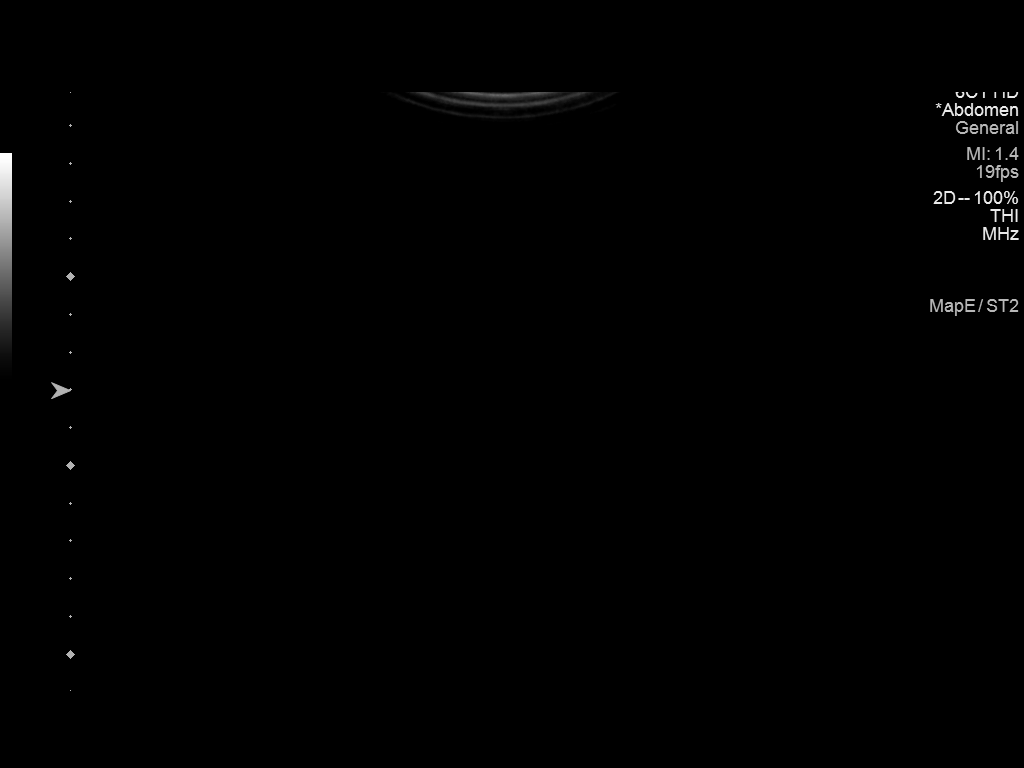

[6 of 6 positions shown; findings below may reference images not displayed]

EXAM:
ULTRASOUND GUIDED THERAPEUTIC PARACENTESIS

MEDICATIONS:
None.

COMPLICATIONS:
None immediate.

PROCEDURE:
Informed written consent was obtained from the patient after a
discussion of the risks, benefits and alternatives to treatment. A
timeout was performed prior to the initiation of the procedure.

Initial ultrasound scanning demonstrates a large amount of ascites
within the left lower abdominal quadrant. The left lower abdomen was
prepped and draped in the usual sterile fashion. 1% lidocaine was
used for local anesthesia.

Following this, a Yueh catheter was introduced. An ultrasound image
was saved for documentation purposes. The paracentesis was
performed. The catheter was removed and a dressing was applied. The
patient tolerated the procedure well without immediate post
procedural complication.
FINDINGS: A total of approximately 6 liters of yellow fluid was removed.
IMPRESSION: Successful ultrasound-guided therapeutic paracentesis yielding 6
liters of peritoneal fluid.

## 2018-05-30 IMAGING — PT NM PET TUM IMG INITIAL (PI) SKULL BASE T - THIGH
1 of 10 series · 1 of 25 positions shown · non-contrast
Comparison: Multiple exams, including 12/16/2016

CLINICAL DATA: Initial treatment strategy for peritoneal
carcinomatosis of unknown primary.

EXAM:
NUCLEAR MEDICINE PET SKULL BASE TO THIGH
TECHNIQUE: 12.6 mCi F-18 FDG was injected intravenously. Full-ring PET imaging
was performed from the skull base to thigh after the radiotracer. CT
data was obtained and used for attenuation correction and anatomic
localization.
FASTING BLOOD GLUCOSE:  Value: 100 mg/dl

[Series 3: ct wb 5.0 b30f · axial · 5.0mm · 0.98mm/px · 1 of 329 slices shown]
[im 329/329  brain]
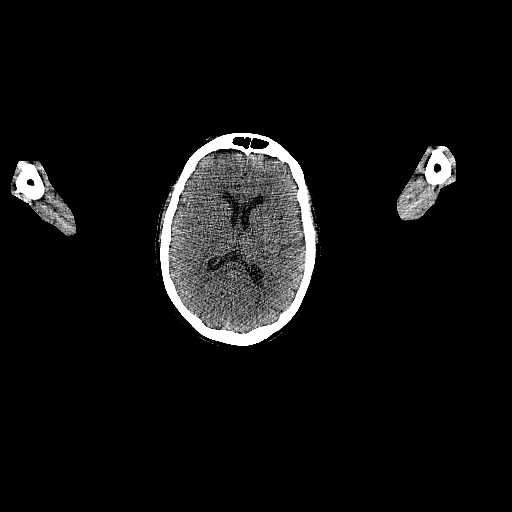

[1 of 25 positions shown; findings below may reference images not displayed]

FINDINGS: NECK

No hypermetabolic lymph nodes in the neck. Incidental failure of
fusion of the anterior and posterior arch of C1, well corticated.

CHEST

Faint nodularity posteriorly in the left upper lobe, not present on
recent chest CT from 12/17/2016, maximum SUV 3.5. The largest nodule
is sub solid and about 1.0 by 0.7 cm on image 88/3. Atelectasis in
both lower lobes.

ABDOMEN/PELVIS

Extensive hypermetabolic omental caking of tumor and extensive
ascites. Along the right omentum a representative region has a
maximum standard uptake value of 14.1. There is hypermetabolic
infiltrative process in the root of the mesentery in just above the
pancreas, maximum SUV 8.7. No obvious primary site along the bowel
identified. No direct parenchymal involvement of the liver or
spleen. No pathologic retroperitoneal adenopathy.

SKELETON

No focal hypermetabolic activity to suggest skeletal metastasis.
IMPRESSION: 1. Extensive hypermetabolic omental caking of tumor with extensive
ascites. No obvious primary site other than the peritoneal, query
primary peritoneal carcinomatosis.
2. There is new the faint but hypermetabolic nodularity in the left
upper lobe which was not present just 14 days ago on the CT of the
chest. Given how quickly this appeared, this is most likely
inflammatory and less likely to represent such a rapid spread of
malignancy, but merits surveillance.

## 2018-05-30 IMAGING — US US PARACENTESIS
1 series · 9 of 9 positions shown · non-contrast
Comparison: none

INDICATION: Recurrent ascites with peritoneal carcinomatosis. Request
therapeutic paracentesis of up to 8 L max.

[Series 1: us paracentesis · 0.30mm/px · 9 of 9 slices shown]
[im 1/9]
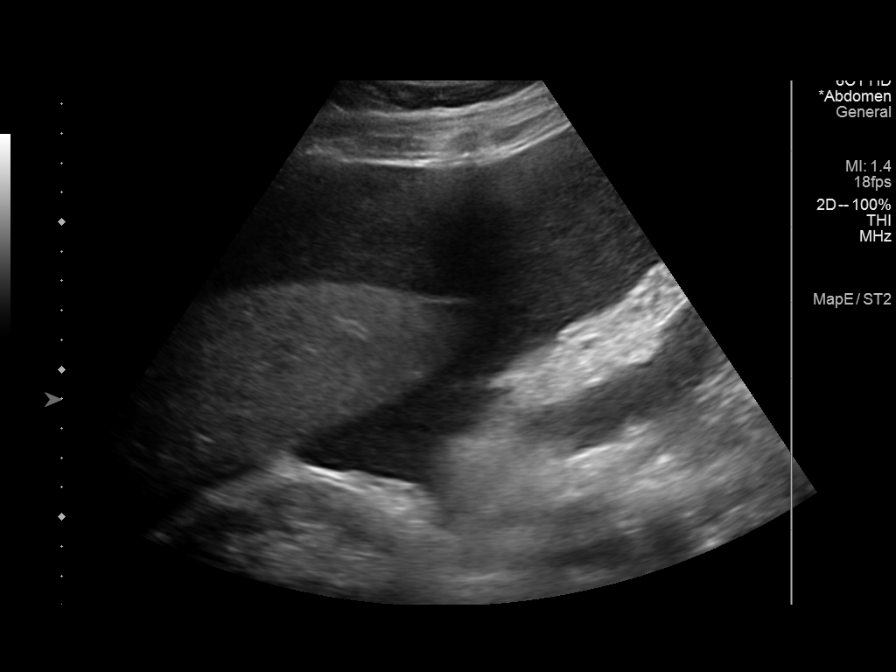
[im 2/9]
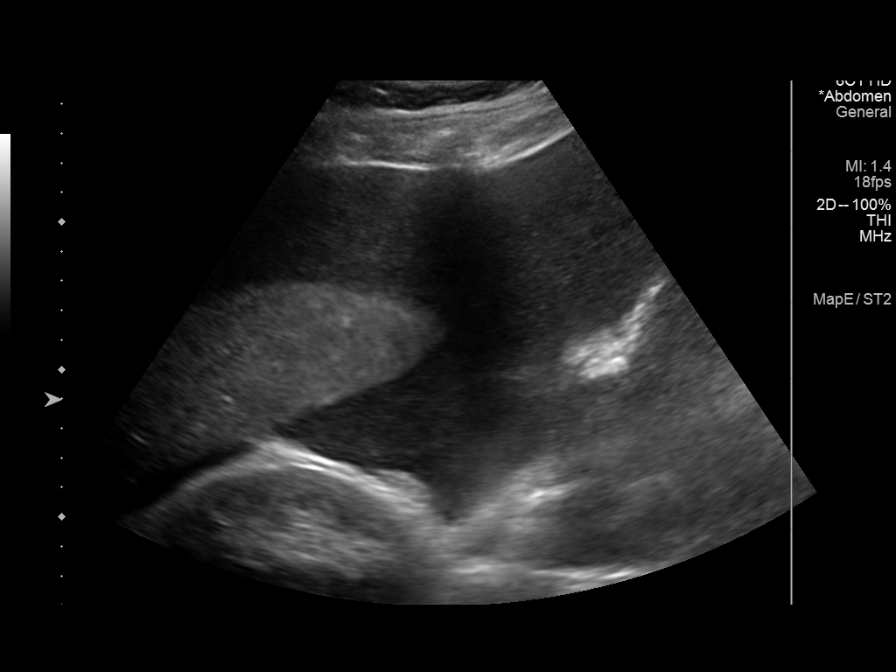
[im 3/9]
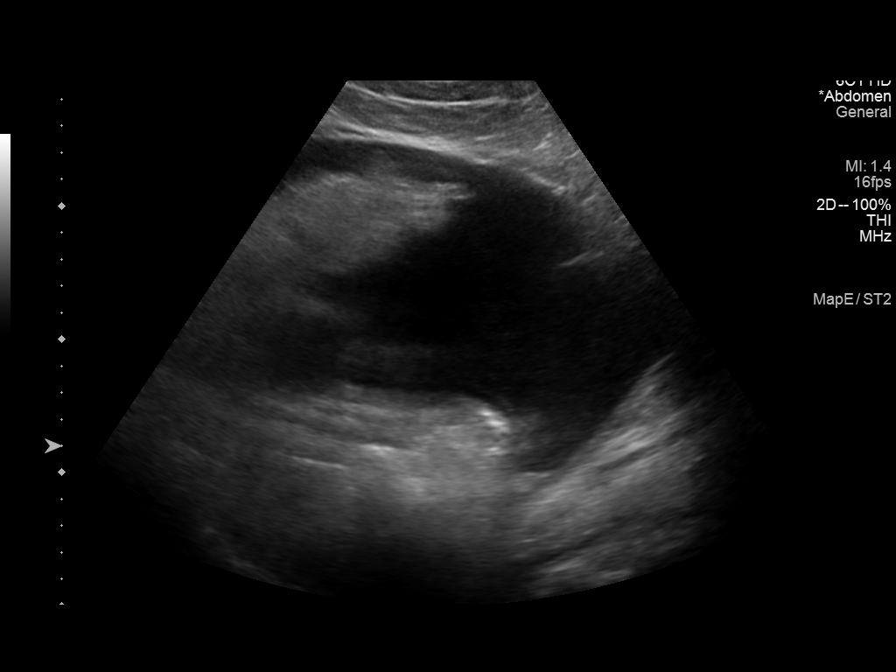
[im 4/9]
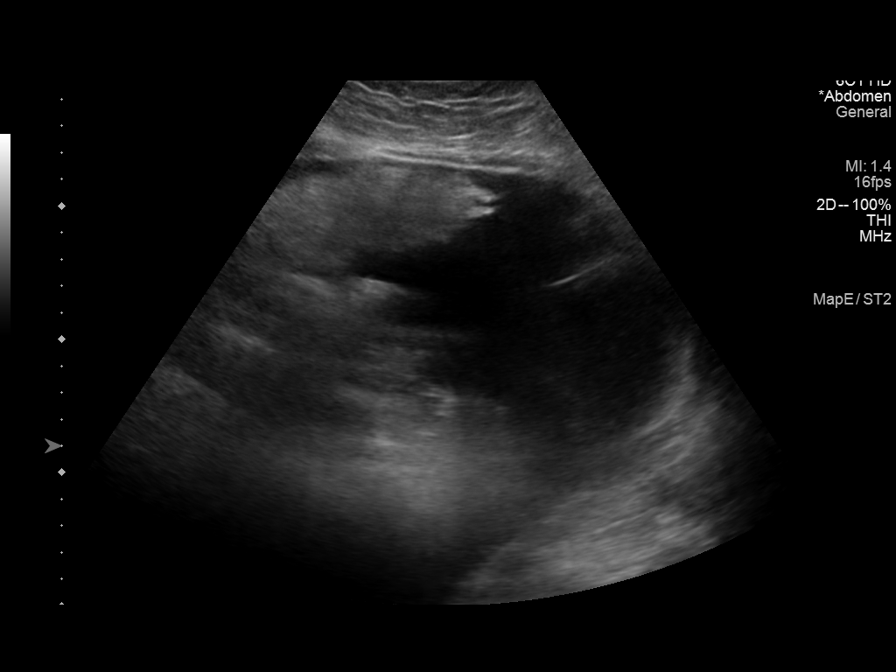
[im 5/9]
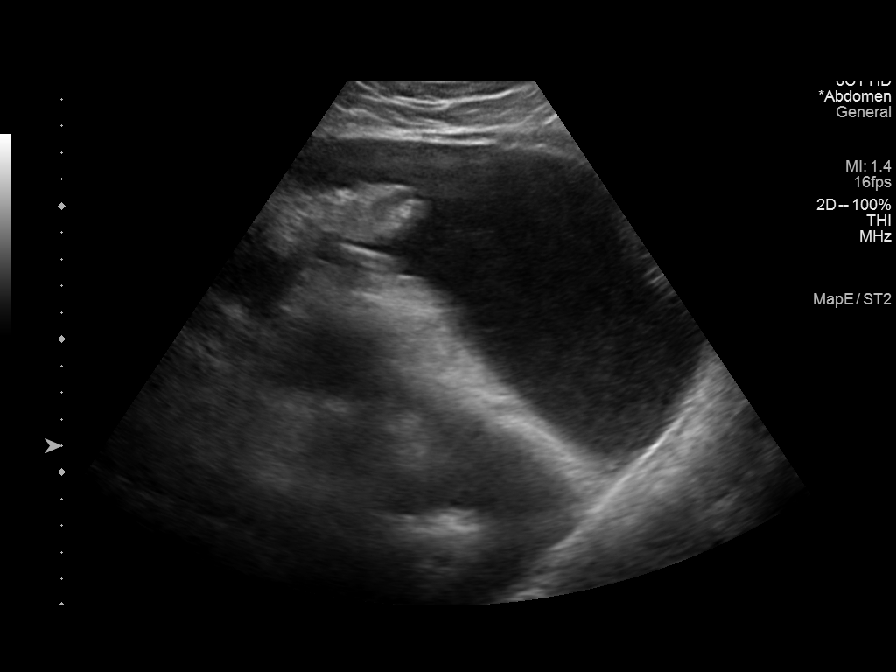
[im 6/9]
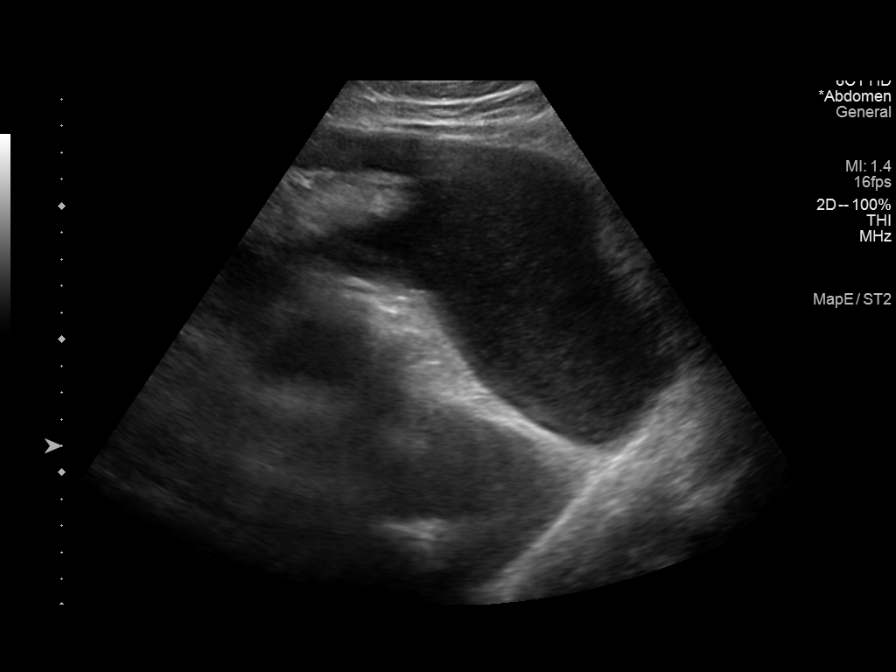
[im 7/9]
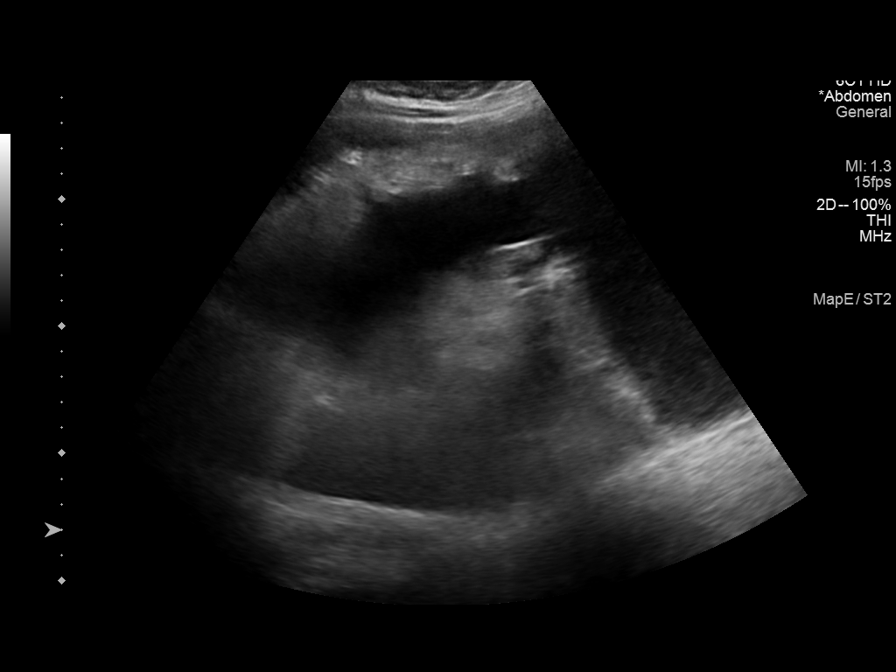
[im 8/9]
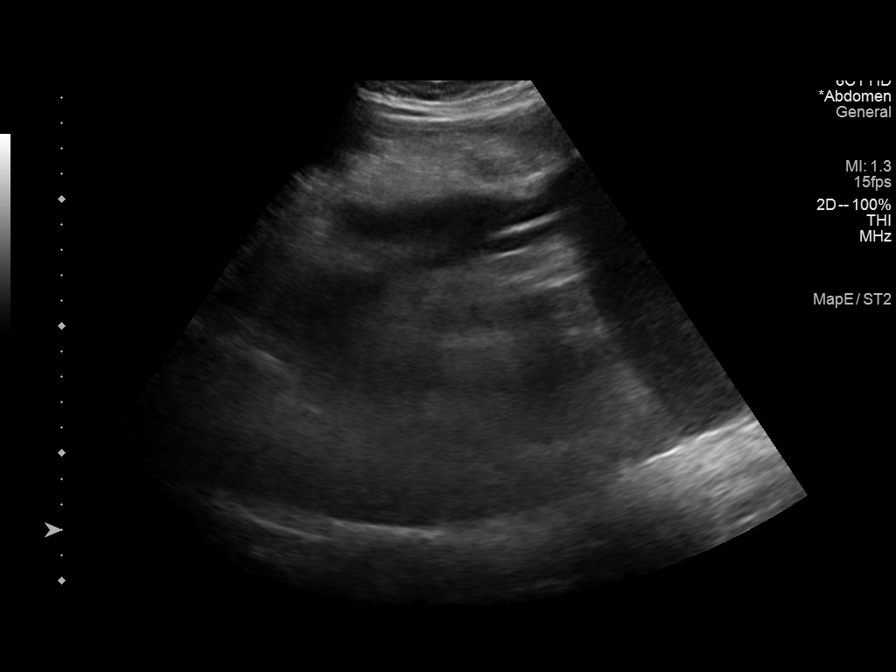
[im 9/9]
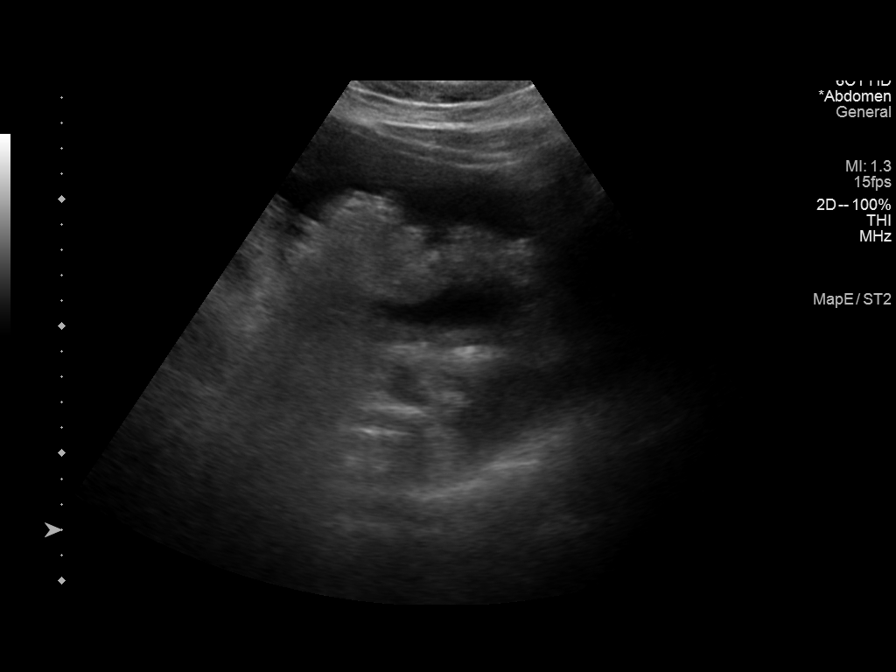

[9 of 9 positions shown; findings below may reference images not displayed]

EXAM:
ULTRASOUND GUIDED RIGHT LOWER QUADRANT PARACENTESIS

MEDICATIONS:
None.

COMPLICATIONS:
None immediate.

PROCEDURE:
Informed written consent was obtained from the patient after a
discussion of the risks, benefits and alternatives to treatment. A
timeout was performed prior to the initiation of the procedure.

Initial ultrasound scanning demonstrates a large amount of ascites
within the right lower abdominal quadrant. The right lower abdomen
was prepped and draped in the usual sterile fashion. 1% lidocaine
with epinephrine was used for local anesthesia.

Following this, a Safe-T-Centesis catheter was introduced. An
ultrasound image was saved for documentation purposes. The
paracentesis was performed. The catheter was removed and a dressing
was applied. The patient tolerated the procedure well without
immediate post procedural complication.
FINDINGS: A total of approximately 8 L of hazy yellow fluid was removed.
IMPRESSION: Successful ultrasound-guided paracentesis yielding 8 liters of
peritoneal fluid.
